# Patient Record
Sex: Male | Born: 1935 | Race: White | Hispanic: No | Marital: Married | Smoking: Former smoker
Health system: Southern US, Community
[De-identification: ages and names within clinical notes are randomized; demographics above are authoritative.]

## PROBLEM LIST (undated history)

## (undated) DIAGNOSIS — Z95810 Presence of automatic (implantable) cardiac defibrillator: Secondary | ICD-10-CM

## (undated) DIAGNOSIS — Z950 Presence of cardiac pacemaker: Secondary | ICD-10-CM

## (undated) DIAGNOSIS — I10 Essential (primary) hypertension: Secondary | ICD-10-CM

## (undated) DIAGNOSIS — E785 Hyperlipidemia, unspecified: Secondary | ICD-10-CM

## (undated) DIAGNOSIS — I428 Other cardiomyopathies: Secondary | ICD-10-CM

## (undated) DIAGNOSIS — R0602 Shortness of breath: Secondary | ICD-10-CM

## (undated) DIAGNOSIS — Z9889 Other specified postprocedural states: Secondary | ICD-10-CM

## (undated) DIAGNOSIS — I4891 Unspecified atrial fibrillation: Secondary | ICD-10-CM

## (undated) DIAGNOSIS — Q211 Atrial septal defect: Secondary | ICD-10-CM

## (undated) DIAGNOSIS — I5020 Unspecified systolic (congestive) heart failure: Secondary | ICD-10-CM

## (undated) DIAGNOSIS — I5022 Chronic systolic (congestive) heart failure: Secondary | ICD-10-CM

## (undated) DIAGNOSIS — Q2112 Patent foramen ovale: Secondary | ICD-10-CM

## (undated) DIAGNOSIS — I2510 Atherosclerotic heart disease of native coronary artery without angina pectoris: Secondary | ICD-10-CM

## (undated) DIAGNOSIS — K439 Ventral hernia without obstruction or gangrene: Secondary | ICD-10-CM

## (undated) HISTORY — PX: INTRAOPERATIVE TRANSESOPHAGEAL ECHOCARDIOGRAM: SHX5062

## (undated) HISTORY — DX: Presence of automatic (implantable) cardiac defibrillator: Z95.810

## (undated) HISTORY — PX: HIP PINNING: SHX1757

## (undated) HISTORY — PX: BILATERAL KNEE ARTHROSCOPY: SUR91

## (undated) HISTORY — DX: Essential (primary) hypertension: I10

## (undated) HISTORY — PX: OTHER SURGICAL HISTORY: SHX169

---

## 2002-04-21 ENCOUNTER — Encounter: Admission: RE | Admit: 2002-04-21 | Discharge: 2002-04-21 | Payer: Self-pay | Admitting: Family Medicine

## 2002-04-21 ENCOUNTER — Encounter: Payer: Self-pay | Admitting: Family Medicine

## 2002-06-09 ENCOUNTER — Encounter: Admission: RE | Admit: 2002-06-09 | Discharge: 2002-06-09 | Payer: Self-pay | Admitting: Orthopedic Surgery

## 2002-06-09 ENCOUNTER — Encounter: Payer: Self-pay | Admitting: Orthopedic Surgery

## 2002-06-11 ENCOUNTER — Ambulatory Visit (HOSPITAL_BASED_OUTPATIENT_CLINIC_OR_DEPARTMENT_OTHER): Admission: RE | Admit: 2002-06-11 | Discharge: 2002-06-12 | Payer: Self-pay | Admitting: Orthopedic Surgery

## 2002-06-13 ENCOUNTER — Emergency Department (HOSPITAL_COMMUNITY): Admission: EM | Admit: 2002-06-13 | Discharge: 2002-06-13 | Payer: Self-pay | Admitting: Emergency Medicine

## 2002-06-16 ENCOUNTER — Encounter: Payer: Self-pay | Admitting: Emergency Medicine

## 2002-06-16 ENCOUNTER — Emergency Department (HOSPITAL_COMMUNITY): Admission: EM | Admit: 2002-06-16 | Discharge: 2002-06-16 | Payer: Self-pay | Admitting: Emergency Medicine

## 2003-08-30 ENCOUNTER — Ambulatory Visit (HOSPITAL_COMMUNITY): Admission: RE | Admit: 2003-08-30 | Discharge: 2003-08-30 | Payer: Self-pay | Admitting: Gastroenterology

## 2003-08-30 ENCOUNTER — Encounter (INDEPENDENT_AMBULATORY_CARE_PROVIDER_SITE_OTHER): Payer: Self-pay | Admitting: Specialist

## 2004-07-30 ENCOUNTER — Encounter: Admission: RE | Admit: 2004-07-30 | Discharge: 2004-07-30 | Payer: Self-pay | Admitting: Family Medicine

## 2004-11-12 ENCOUNTER — Inpatient Hospital Stay (HOSPITAL_COMMUNITY): Admission: EM | Admit: 2004-11-12 | Discharge: 2004-11-16 | Payer: Self-pay | Admitting: Emergency Medicine

## 2004-11-13 ENCOUNTER — Ambulatory Visit: Payer: Self-pay | Admitting: Cardiology

## 2004-11-13 ENCOUNTER — Encounter: Payer: Self-pay | Admitting: Cardiology

## 2004-11-15 ENCOUNTER — Encounter: Payer: Self-pay | Admitting: Internal Medicine

## 2004-11-28 ENCOUNTER — Inpatient Hospital Stay (HOSPITAL_COMMUNITY)
Admission: RE | Admit: 2004-11-28 | Discharge: 2004-12-08 | Payer: Self-pay | Admitting: Thoracic Surgery (Cardiothoracic Vascular Surgery)

## 2004-12-11 ENCOUNTER — Ambulatory Visit: Payer: Self-pay | Admitting: Cardiology

## 2004-12-12 ENCOUNTER — Encounter
Admission: RE | Admit: 2004-12-12 | Discharge: 2005-01-28 | Payer: Self-pay | Admitting: Thoracic Surgery (Cardiothoracic Vascular Surgery)

## 2004-12-18 ENCOUNTER — Ambulatory Visit: Payer: Self-pay | Admitting: Cardiology

## 2004-12-19 ENCOUNTER — Ambulatory Visit: Payer: Self-pay

## 2004-12-25 ENCOUNTER — Ambulatory Visit: Payer: Self-pay | Admitting: Cardiology

## 2005-01-01 ENCOUNTER — Ambulatory Visit: Payer: Self-pay | Admitting: Cardiovascular Disease

## 2005-01-03 ENCOUNTER — Encounter (HOSPITAL_COMMUNITY): Admission: RE | Admit: 2005-01-03 | Discharge: 2005-04-03 | Payer: Self-pay | Admitting: *Deleted

## 2005-01-10 ENCOUNTER — Ambulatory Visit: Payer: Self-pay | Admitting: Internal Medicine

## 2005-01-17 ENCOUNTER — Ambulatory Visit: Payer: Self-pay | Admitting: Internal Medicine

## 2005-01-24 ENCOUNTER — Ambulatory Visit: Payer: Self-pay

## 2005-01-29 ENCOUNTER — Encounter
Admission: RE | Admit: 2005-01-29 | Discharge: 2005-04-29 | Payer: Self-pay | Admitting: Thoracic Surgery (Cardiothoracic Vascular Surgery)

## 2005-02-04 ENCOUNTER — Ambulatory Visit: Payer: Self-pay | Admitting: Cardiology

## 2005-02-19 ENCOUNTER — Ambulatory Visit: Payer: Self-pay | Admitting: Cardiology

## 2005-03-01 ENCOUNTER — Ambulatory Visit: Payer: Self-pay | Admitting: Cardiology

## 2005-03-11 ENCOUNTER — Ambulatory Visit: Payer: Self-pay | Admitting: Internal Medicine

## 2005-03-17 ENCOUNTER — Emergency Department (HOSPITAL_COMMUNITY): Admission: EM | Admit: 2005-03-17 | Discharge: 2005-03-18 | Payer: Self-pay | Admitting: Emergency Medicine

## 2005-03-20 ENCOUNTER — Ambulatory Visit: Payer: Self-pay | Admitting: Cardiology

## 2005-04-01 ENCOUNTER — Ambulatory Visit: Payer: Self-pay | Admitting: Cardiology

## 2005-04-01 ENCOUNTER — Ambulatory Visit: Payer: Self-pay

## 2005-04-05 ENCOUNTER — Encounter (HOSPITAL_COMMUNITY): Admission: RE | Admit: 2005-04-05 | Discharge: 2005-04-09 | Payer: Self-pay | Admitting: *Deleted

## 2005-04-10 ENCOUNTER — Ambulatory Visit: Payer: Self-pay | Admitting: *Deleted

## 2005-04-15 ENCOUNTER — Encounter (HOSPITAL_COMMUNITY): Admission: RE | Admit: 2005-04-15 | Discharge: 2005-07-14 | Payer: Self-pay | Admitting: Cardiology

## 2005-04-17 ENCOUNTER — Ambulatory Visit: Payer: Self-pay | Admitting: Cardiology

## 2005-04-23 ENCOUNTER — Ambulatory Visit: Payer: Self-pay | Admitting: Internal Medicine

## 2005-06-08 ENCOUNTER — Emergency Department (HOSPITAL_COMMUNITY): Admission: EM | Admit: 2005-06-08 | Discharge: 2005-06-08 | Payer: Self-pay | Admitting: Emergency Medicine

## 2005-07-29 ENCOUNTER — Encounter: Admission: RE | Admit: 2005-07-29 | Discharge: 2005-07-29 | Payer: Self-pay | Admitting: Family Medicine

## 2005-08-06 ENCOUNTER — Encounter (HOSPITAL_COMMUNITY): Admission: RE | Admit: 2005-08-06 | Discharge: 2005-11-04 | Payer: Self-pay | Admitting: *Deleted

## 2005-08-20 ENCOUNTER — Ambulatory Visit: Payer: Self-pay | Admitting: Internal Medicine

## 2005-11-05 ENCOUNTER — Encounter (HOSPITAL_COMMUNITY): Admission: RE | Admit: 2005-11-05 | Discharge: 2006-02-03 | Payer: Self-pay | Admitting: *Deleted

## 2005-11-19 ENCOUNTER — Ambulatory Visit: Payer: Self-pay | Admitting: Internal Medicine

## 2006-02-04 ENCOUNTER — Encounter (HOSPITAL_COMMUNITY): Admission: RE | Admit: 2006-02-04 | Discharge: 2006-05-05 | Payer: Self-pay | Admitting: *Deleted

## 2006-05-06 ENCOUNTER — Encounter (HOSPITAL_COMMUNITY): Admission: RE | Admit: 2006-05-06 | Discharge: 2006-08-04 | Payer: Self-pay | Admitting: *Deleted

## 2006-05-21 ENCOUNTER — Ambulatory Visit: Payer: Self-pay | Admitting: Internal Medicine

## 2006-08-05 ENCOUNTER — Encounter (HOSPITAL_COMMUNITY): Admission: RE | Admit: 2006-08-05 | Discharge: 2006-11-03 | Payer: Self-pay | Admitting: Cardiology

## 2006-11-04 ENCOUNTER — Encounter (HOSPITAL_COMMUNITY): Admission: RE | Admit: 2006-11-04 | Discharge: 2007-02-02 | Payer: Self-pay | Admitting: Cardiology

## 2006-12-09 ENCOUNTER — Ambulatory Visit: Payer: Self-pay | Admitting: Internal Medicine

## 2007-02-04 ENCOUNTER — Encounter (HOSPITAL_COMMUNITY): Admission: RE | Admit: 2007-02-04 | Discharge: 2007-05-05 | Payer: Self-pay | Admitting: Cardiology

## 2007-03-03 ENCOUNTER — Ambulatory Visit: Payer: Self-pay | Admitting: Internal Medicine

## 2007-05-07 ENCOUNTER — Encounter (HOSPITAL_COMMUNITY): Admission: RE | Admit: 2007-05-07 | Discharge: 2007-08-05 | Payer: Self-pay | Admitting: Cardiology

## 2007-06-02 ENCOUNTER — Ambulatory Visit: Payer: Self-pay | Admitting: Internal Medicine

## 2007-08-06 ENCOUNTER — Encounter (HOSPITAL_COMMUNITY): Admission: RE | Admit: 2007-08-06 | Discharge: 2007-11-04 | Payer: Self-pay | Admitting: Cardiology

## 2007-09-01 ENCOUNTER — Ambulatory Visit: Payer: Self-pay | Admitting: Internal Medicine

## 2007-11-05 ENCOUNTER — Encounter (HOSPITAL_COMMUNITY): Admission: RE | Admit: 2007-11-05 | Discharge: 2008-02-03 | Payer: Self-pay | Admitting: Cardiology

## 2008-02-04 ENCOUNTER — Encounter (HOSPITAL_COMMUNITY): Admission: RE | Admit: 2008-02-04 | Discharge: 2008-05-04 | Payer: Self-pay | Admitting: Cardiology

## 2008-02-23 ENCOUNTER — Ambulatory Visit: Payer: Self-pay | Admitting: Internal Medicine

## 2008-02-25 ENCOUNTER — Emergency Department (HOSPITAL_COMMUNITY): Admission: EM | Admit: 2008-02-25 | Discharge: 2008-02-25 | Payer: Self-pay | Admitting: Emergency Medicine

## 2008-05-06 ENCOUNTER — Encounter (HOSPITAL_COMMUNITY): Admission: RE | Admit: 2008-05-06 | Discharge: 2008-08-04 | Payer: Self-pay | Admitting: Cardiology

## 2008-05-23 ENCOUNTER — Ambulatory Visit: Payer: Self-pay

## 2008-05-23 ENCOUNTER — Encounter: Payer: Self-pay | Admitting: Internal Medicine

## 2008-08-05 ENCOUNTER — Encounter (HOSPITAL_COMMUNITY): Admission: RE | Admit: 2008-08-05 | Discharge: 2008-11-03 | Payer: Self-pay | Admitting: Cardiology

## 2008-08-18 ENCOUNTER — Ambulatory Visit: Payer: Self-pay | Admitting: Internal Medicine

## 2008-09-13 ENCOUNTER — Encounter: Payer: Self-pay | Admitting: Internal Medicine

## 2008-10-04 ENCOUNTER — Telehealth: Payer: Self-pay | Admitting: Internal Medicine

## 2008-11-04 ENCOUNTER — Ambulatory Visit: Payer: Self-pay | Admitting: Internal Medicine

## 2008-11-04 ENCOUNTER — Encounter (HOSPITAL_COMMUNITY): Admission: RE | Admit: 2008-11-04 | Discharge: 2009-02-02 | Payer: Self-pay | Admitting: Cardiology

## 2008-11-04 DIAGNOSIS — Z95810 Presence of automatic (implantable) cardiac defibrillator: Secondary | ICD-10-CM

## 2008-11-04 DIAGNOSIS — I10 Essential (primary) hypertension: Secondary | ICD-10-CM

## 2009-02-03 ENCOUNTER — Encounter (HOSPITAL_COMMUNITY): Admission: RE | Admit: 2009-02-03 | Discharge: 2009-05-04 | Payer: Self-pay | Admitting: Cardiology

## 2009-02-06 ENCOUNTER — Ambulatory Visit: Payer: Self-pay | Admitting: Internal Medicine

## 2009-02-17 ENCOUNTER — Encounter: Payer: Self-pay | Admitting: Internal Medicine

## 2009-04-21 ENCOUNTER — Telehealth: Payer: Self-pay | Admitting: Internal Medicine

## 2009-04-24 ENCOUNTER — Encounter: Payer: Self-pay | Admitting: Internal Medicine

## 2009-05-06 ENCOUNTER — Encounter (HOSPITAL_COMMUNITY): Admission: RE | Admit: 2009-05-06 | Discharge: 2009-08-04 | Payer: Self-pay | Admitting: Interventional Cardiology

## 2009-05-09 ENCOUNTER — Telehealth: Payer: Self-pay | Admitting: Internal Medicine

## 2009-05-11 ENCOUNTER — Ambulatory Visit: Payer: Self-pay | Admitting: Internal Medicine

## 2009-05-19 ENCOUNTER — Telehealth (INDEPENDENT_AMBULATORY_CARE_PROVIDER_SITE_OTHER): Payer: Self-pay | Admitting: *Deleted

## 2009-05-29 ENCOUNTER — Ambulatory Visit: Payer: Self-pay | Admitting: Internal Medicine

## 2009-06-08 ENCOUNTER — Encounter: Payer: Self-pay | Admitting: Internal Medicine

## 2009-06-27 ENCOUNTER — Encounter: Payer: Self-pay | Admitting: Internal Medicine

## 2009-07-20 ENCOUNTER — Ambulatory Visit: Payer: Self-pay | Admitting: Internal Medicine

## 2009-08-05 ENCOUNTER — Encounter (HOSPITAL_COMMUNITY): Admission: RE | Admit: 2009-08-05 | Discharge: 2009-11-03 | Payer: Self-pay | Admitting: Cardiology

## 2009-08-10 ENCOUNTER — Telehealth: Payer: Self-pay | Admitting: Internal Medicine

## 2009-09-21 ENCOUNTER — Ambulatory Visit: Payer: Self-pay | Admitting: Internal Medicine

## 2009-09-21 DIAGNOSIS — I4891 Unspecified atrial fibrillation: Secondary | ICD-10-CM

## 2009-10-19 ENCOUNTER — Ambulatory Visit: Payer: Self-pay | Admitting: Internal Medicine

## 2009-10-20 ENCOUNTER — Telehealth: Payer: Self-pay | Admitting: Internal Medicine

## 2009-11-02 ENCOUNTER — Encounter: Payer: Self-pay | Admitting: Internal Medicine

## 2009-11-04 ENCOUNTER — Encounter (HOSPITAL_COMMUNITY): Admission: RE | Admit: 2009-11-04 | Discharge: 2010-02-02 | Payer: Self-pay | Admitting: Cardiology

## 2009-12-12 ENCOUNTER — Telehealth: Payer: Self-pay | Admitting: Internal Medicine

## 2009-12-25 ENCOUNTER — Encounter: Payer: Self-pay | Admitting: Internal Medicine

## 2010-01-18 ENCOUNTER — Ambulatory Visit: Payer: Self-pay | Admitting: Internal Medicine

## 2010-01-19 ENCOUNTER — Encounter: Payer: Self-pay | Admitting: Internal Medicine

## 2010-01-22 ENCOUNTER — Ambulatory Visit: Payer: Self-pay | Admitting: Internal Medicine

## 2010-01-22 LAB — CONVERTED CEMR LAB
BUN: 21 mg/dL (ref 6–23)
Basophils Absolute: 0 10*3/uL (ref 0.0–0.1)
CO2: 32 meq/L (ref 19–32)
Calcium: 9.1 mg/dL (ref 8.4–10.5)
Creatinine, Ser: 1.2 mg/dL (ref 0.4–1.5)
Eosinophils Absolute: 0.3 10*3/uL (ref 0.0–0.7)
Eosinophils Relative: 4.3 % (ref 0.0–5.0)
GFR calc non Af Amer: 60.53 mL/min (ref >60)
Lymphocytes Relative: 18.8 % (ref 12.0–46.0)
MCV: 92.9 fL (ref 78.0–100.0)
Monocytes Relative: 7.6 % (ref 3.0–12.0)
Neutro Abs: 4 10*3/uL (ref 1.4–7.7)
Platelets: 125 10*3/uL — ABNORMAL LOW (ref 150.0–400.0)
Prothrombin Time: 13.3 s — ABNORMAL HIGH (ref 9.7–11.8)
RBC: 4.34 M/uL (ref 4.22–5.81)
RDW: 15 % — ABNORMAL HIGH (ref 11.5–14.6)
Sodium: 142 meq/L (ref 135–145)

## 2010-01-29 ENCOUNTER — Ambulatory Visit (HOSPITAL_COMMUNITY): Admission: RE | Admit: 2010-01-29 | Discharge: 2010-01-29 | Payer: Self-pay | Admitting: Internal Medicine

## 2010-01-29 ENCOUNTER — Ambulatory Visit: Payer: Self-pay | Admitting: Internal Medicine

## 2010-01-30 ENCOUNTER — Telehealth: Payer: Self-pay | Admitting: Internal Medicine

## 2010-02-03 ENCOUNTER — Encounter (HOSPITAL_COMMUNITY)
Admission: RE | Admit: 2010-02-03 | Discharge: 2010-05-04 | Payer: Self-pay | Source: Home / Self Care | Attending: Internal Medicine | Admitting: Internal Medicine

## 2010-02-05 ENCOUNTER — Encounter: Payer: Self-pay | Admitting: Internal Medicine

## 2010-02-19 ENCOUNTER — Ambulatory Visit: Payer: Self-pay

## 2010-02-19 ENCOUNTER — Encounter: Payer: Self-pay | Admitting: Internal Medicine

## 2010-03-20 ENCOUNTER — Encounter: Payer: Self-pay | Admitting: Internal Medicine

## 2010-05-08 ENCOUNTER — Encounter (HOSPITAL_COMMUNITY)
Admission: RE | Admit: 2010-05-08 | Discharge: 2010-06-05 | Payer: Self-pay | Source: Home / Self Care | Attending: Internal Medicine | Admitting: Internal Medicine

## 2010-05-09 ENCOUNTER — Encounter: Payer: Self-pay | Admitting: Internal Medicine

## 2010-05-09 ENCOUNTER — Ambulatory Visit
Admission: RE | Admit: 2010-05-09 | Discharge: 2010-05-09 | Payer: Self-pay | Source: Home / Self Care | Attending: Internal Medicine | Admitting: Internal Medicine

## 2010-06-05 NOTE — Letter (Signed)
Summary: Implantable Device Instructions  [employer] [employer], Main Office  [address]   [place], [place] [address]   Phone: [phone]  Fax: [phone]      Implantable Device Instructions  You are scheduled for:   _____ Generator Change  on 01/29/10 with Dr. Ladona Ridgel.  1.  Please arrive at the Short Stay Center at [facility] at 7:30am on the day of your procedure.  2.  Do not eat or drink the night before your procedure.  3.  Complete lab work on 01/19/10 at 10:00am.  .  You do not have to be fasting.  4.  Do NOT take these medications for the morning of your procedure:  Furosemide.  5.  Plan for an overnight stay.  Bring your insurance cards and a list of your medications.  6.  Wash your chest and neck with antibacterial soap (any brand) the evening before and the morning of your procedure.  Rinse well.   *If you have ANY questions after you get home, please call the office [phone]. Arthur Armstrong  *Every attempt is made to prevent procedures from being rescheduled.  Due to the nauture of Electrophysiology, rescheduling can happen.  The physician is always aware and directs the staff when this occurs.

## 2010-06-05 NOTE — Progress Notes (Signed)
Summary: still tired  Phone Note Call from Patient Call back at [phone]   Caller: Daughter/Sheila Mayford Knife Reason for Call: Talk to Nurse Summary of Call: pt is still feeling very tired... saw PCP and was told to contact us Initial call taken by: Migdalia Dk,  May 19, 2009 2:51 PM  Follow-up for Phone Call        The patient was scheduled to see GT 05/29/2009 and the daughter is in agreement.   Follow-up by: Altha Harm, LPN,  May 19, 2009 3:41 PM

## 2010-06-05 NOTE — Progress Notes (Signed)
Summary: pt has questions about his refills  Phone Note Call from Patient Call back at [phone]   Caller: Patient Reason for Call: Talk to Nurse, Talk to Doctor Summary of Call: pt went and picked up furosemide and he only has 4 refills and he gets only a 30day supply so it should have been 11 refills and he dosen't understand Initial call taken by: Omer Jack,  August 10, 2009 10:36 AM  Follow-up for Phone Call        llmovm giving answer to question. Follow-up by: Laurance Flatten CMA,  August 14, 2009 9:12 AM

## 2010-06-05 NOTE — Miscellaneous (Signed)
Summary: [facility] Progress Note  [facility] Progress Note   Imported By: Roderic Ovens 07/18/2009 12:25:57  _____________________________________________________________________  External Attachment:    Type:   Image     Comment:   External Document

## 2010-06-05 NOTE — Cardiovascular Report (Signed)
Summary: Office Visit Remote   Office Visit Remote   Imported By: Roderic Ovens 01/23/2010 13:48:22  _____________________________________________________________________  External Attachment:    Type:   Image     Comment:   External Document

## 2010-06-05 NOTE — Progress Notes (Signed)
Summary: question re med sent to [facility]  Phone Note Call from Patient   Caller: Daughter Donnel Saxon [phone] Reason for Call: Talk to Nurse Summary of Call: pt's med were all sent to [facility] at his last visit and dtr wanted o know why Initial call taken by: Glynda Jaeger,  January 30, 2010 4:05 PM  Follow-up for Phone Call        spoke with daughter and tried to explain that the only way we would have know to call into [facility] would have been he would have told us.  we will call all future Rx's into [facility] per his daughter. Dennis Bast, RN, BSN  January 30, 2010 4:20 PM

## 2010-06-05 NOTE — Letter (Signed)
Summary: Remote Device Check  [employer], Main Office  [address]   [place], [place] [address]   Phone: [phone]  Fax: [phone]     November 02, 2009 MRN: [id number]   [facility] [address] [place], [place]  [address]   Dear Mr. Lamadrid,   Your remote transmission was recieved and reviewed by your physician.  All diagnostics were within normal limits for you.  __X___Your next transmission is scheduled for:  01-18-2010.  Please transmit at any time this day.  If you have a wireless device your transmission will be sent automatically.    Sincerely,  Vella Kohler

## 2010-06-05 NOTE — Miscellaneous (Signed)
Summary: [facility] Progress Note  [facility] Progress Note   Imported By: Roderic Ovens 07/20/2009 11:26:28  _____________________________________________________________________  External Attachment:    Type:   Image     Comment:   External Document

## 2010-06-05 NOTE — Procedures (Signed)
Summary: wound check.[email or web address].amber   Current Medications (verified): 1)  Potassium Chloride Cr 10 Meq Cr-Caps (Potassium Chloride) .... Take One Tablet By Mouth Daily 2)  Coreg 6.25 Mg Tabs (Carvedilol) .Marland Kitchen.. 1 By Mouth Two Times A Day 3)  Lasix 40 Mg Tabs (Furosemide) .... Take 1 Tablet By Mouth Every Morning and 1 Tablet By Mouth At Lunch 4)  Aspirin Ec 325 Mg Tbec (Aspirin) .... Take One Tablet By Mouth Daily 5)  Niaspan 500 Mg Cr-Tabs (Niacin (Antihyperlipidemic)) .Marland Kitchen.. 1 By Mouth Once Daily 6)  Flomax 0.4 Mg Xr24h-Cap (Tamsulosin Hcl) .Marland Kitchen.. 1 By Mouth At Bedtime 7)  Ramipril 2.5 Mg Caps (Ramipril) .... Take 1 Capsule By Mouth Two Times A Day  Allergies (verified): No Known Drug Allergies   ICD Specifications Following MD:  Lewayne Bunting, MD     ICD Vendor:  St Jude     ICD Model Number:  [phone]     ICD Serial Number:  [id number] ICD DOI:  01/29/2010     ICD Implanting MD:  Lewayne Bunting, MD  Lead 1:    Location: RA     DOI: 12/05/2004     Model #: 1488TC     Serial #: [id number]     Status: active Lead 2:    Location: RV     DOI: 12/05/2004     Model #: 7001     Serial #: [id number]     Status: active Lead 3:    Location: LV     DOI: 12/05/2004     Model #: 1056     Serial #: [id number]     Status: active  Indications::  NICM, CHF  Explantation Comments: 01/29/10 St. Jude Atlas V343/300792 explanted  ICD Follow Up Battery Voltage:  >95% V     Charge Time:  8.5 seconds     Battery Est. Longevity:  6.2 yrs Underlying rhythm:  SR ICD Dependent:  No       ICD Device Measurements Atrium:  Amplitude: 1.8 mV, Impedance: 390 ohms,  Right Ventricle:  Amplitude: 12.0 mV, Impedance: 430 ohms, Threshold: 0.5 V at 0.5 msec Left Ventricle:  Impedance: 360 ohms, Threshold: 1.25 V at 0.8 msec Configuration: BIPOLAR  Episodes MS Episodes:  3     Percent Mode Switch:  >99%     Coumadin:  No Shock:  0     ATP:  0     Nonsustained:  0     Atrial Therapies:  0 Atrial Pacing:  0%     Ventricular Pacing:   74%  Brady Parameters Mode DDD     Lower Rate Limit:  60     Upper Rate Limit 110 PAV 170     Sensed AV Delay:  150  Tachy Zones VF:  240     VT:  200     VT1:  160     Next Cardiology Appt Due:  05/09/2010 Tech Comments:  WOUND CHECK--STERI STRIPS REMOVED.  NO REDNESS OR SWELLING AT SITE.  NORMAL DEVICE FUNCTION.  PT IN AF 99% OF TIME. + ASPIRIN.  NO COUMADIN THERAPY. QUICK OPT PERFORMED--INTERVENTRICULAR PACE DELAY CHANGED FROM 25 TO 30ms.  CHANGED LV OUTPUT FROM 2.25 TO 2.5 V.  ROV IN JAN W/GT. Vella Kohler  February 20, 2010 8:25 AM

## 2010-06-05 NOTE — Progress Notes (Signed)
Summary: pt having sob  Phone Note Call from Patient Call back at [phone]   Caller: Daughter/ Velna Hatchet Summary of Call: Pt having SOB Initial call taken by: Judie Grieve,  December 12, 2009 4:52 PM  Follow-up for Phone Call        He over slept this morning and he was SOB  Fluid is up.  Dr Wylene Simmer saw pt today and Increased his fliuid pill and told him to come back on Mon.  Spoke with daughter.  They will do this and she will let me know if things do not get better. Dennis Bast, RN, BSN  December 12, 2009 6:12 PM

## 2010-06-05 NOTE — Assessment & Plan Note (Signed)
Summary: 6 wk f/u   Primary Provider:  Simone Curia, MD   History of Present Illness: Arthur Armstrong returns today for followup.  He is a pleasant 77 yo man with a h/o DCM, CHF, and is s/p BIV ICD.  The patient notes that his dyspnea remains prevalent. He has class 2 symptoms.  He denies c/p, peripheral edema or syncope.  He has not had any intercurrent ICD therapies. When I saw him last a month ago, he was having more CHF symptoms and he had his dose of lasix increased though there was some compliance issues.  He has done a nice job of trying to avoid sodium. He was noted in rehab to have a drop in his pulse ox to 84% with exertion.  Current Medications (verified): 1)  Potassium Chloride Cr 10 Meq Cr-Caps (Potassium Chloride) .... Take One Tablet By Mouth Daily 2)  Coreg 6.25 Mg Tabs (Carvedilol) .Marland Kitchen.. 1 By Mouth Two Times A Day 3)  Lasix 40 Mg Tabs (Furosemide) .... Take 1 Tablet By Mouth Once A Day As Directed 4)  Aspirin Ec 325 Mg Tbec (Aspirin) .... Take One Tablet By Mouth Daily 5)  Niaspan 500 Mg Cr-Tabs (Niacin (Antihyperlipidemic)) .Marland Kitchen.. 1 By Mouth Once Daily 6)  Ramipril 1.25 Mg Caps (Ramipril) .... Take One Capsule By Mouth Two Times A Day 7)  Flomax 0.4 Mg Xr24h-Cap (Tamsulosin Hcl) .Marland Kitchen.. 1 By Mouth At Bedtime  Allergies (verified): No Known Drug Allergies  Past History:  Past Medical History: Last updated: 11/04/2008 AUTOMATIC IMPLANTABLE CARDIAC DEFIBRILLATOR SITU (ICD-V45.02) CARDIOMYOPATHY, SECONDARY (ICD-425.9) CHF (ICD-428.0) HYPERTENSION (ICD-401.9)  Past Surgical History: Last updated: 11/04/2008  1.  Right shoulder surgery.  2.  Bilateral knee arthroscopy  Colonoscopy with polypectomy.   Intraoperative echocardiogram.  Median sternotomy for mitral valve repair Implantation of a dual chamber biventricular ICD.  12/05/2004  Arthur Armstrong. Ladona Ridgel, M.D.   Review of Systems       The patient complains of dyspnea on exertion.  The patient denies chest pain, syncope, and  peripheral edema.    Vital Signs:  Patient profile:   77 year old male Height:      66.5 inches Weight:      186 pounds BMI:     29.68 Pulse rate:   60 / minute BP sitting:   126 / 84  (left arm) Cuff size:   regular  Vitals Entered By: Arthur Bast, RN, BSN (July 20, 2009 2:30 PM)  Physical Exam  General:  Well developed, well nourished, in no acute distress. Head:  normocephalic and atraumatic Eyes:  PERRLA/EOM intact; conjunctiva and lids normal. Mouth:  Teeth, gums and palate normal. Oral mucosa normal. Neck:  Neck supple, no JVD. No masses, thyromegaly or abnormal cervical nodes. Chest Wall:  Well healed ICD incision. Lungs:  Clear bilaterally.  No wheezes, rales, or rhonchi. Heart:  RRR with normal S1 and S2.  PMI is enlarged and laterally displaced.  No murmurs. Abdomen:  Bowel sounds positive; abdomen soft and non-tender without masses, organomegaly, or hernias noted. No hepatosplenomegaly. Msk:  Back normal, normal gait. Muscle strength and tone normal. Pulses:  pulses normal in all 4 extremities Extremities:  No clubbing or cyanosis.  Mild ecchymosis is present on the arms. Neurologic:  Alert and oriented x 3.    ICD Specifications Following MD:  Arthur Bunting, MD     ICD Vendor:  [facility] Jude     ICD Model Number:  [phone]     ICD Serial Number:  [id number] ICD DOI:  12/05/2004     ICD Implanting MD:  Arthur Bunting, MD  Lead 1:    Location: RA     DOI: 12/05/2004     Model #: 1488TC     Serial #: [id number]     Status: active Lead 2:    Location: RV     DOI: 12/05/2004     Model #: 7001     Serial #: [id number]     Status: active Lead 3:    Location: LV     DOI: 12/05/2004     Model #: 1056     Serial #: [id number]     Status: active  Indications::  NICM, CHF   ICD Follow Up Remote Check?  No Battery Voltage:  2.54 V     Charge Time:  12.6 seconds     Underlying rhythm:  a-fib/dependent ICD Dependent:  Yes       ICD Device Measurements Atrium:  Amplitude: 0.8 mV, Impedance:  410 ohms,  Right Ventricle:  Impedance: 520 ohms, Threshold: 0.5 V at 0.5 msec Left Ventricle:  Impedance: 770 ohms, Threshold: 2.0 V at 1.0 msec Configuration: BIPOLAR  Episodes Coumadin:  No Atrial Pacing:  <1%     Ventricular Pacing:  94%  Brady Parameters Mode DDIR     Lower Rate Limit:  60     Upper Rate Limit 120 PAV 170     Sensed AV Delay:  120  Tachy Zones VF:  240     VT:  200     VT1:  160     Next Remote Date:  08/21/2009     Next Cardiology Appt Due:  09/03/2009 Tech Comments:  No parameter changes.  A-fib/dependent today, - coumadin.  Arthur Armstrong has an Atlas device with the battery voltage 2.54 today and is dependent.  Because of the unpredictability of the battery after it reaches 2.55 we will check him in one month via Merlin and have him return in 2months. Arthur Harm, LPN  July 20, 2009 2:44 PM  MD Comments:  Agree with above.  Impression & Recommendations:  Problem # 1:  AUTOMATIC IMPLANTABLE CARDIAC DEFIBRILLATOR SITU (ICD-V45.02) His device is working normally.  Will folllowup in several months.  Problem # 2:  CHF (ICD-428.0) His symptoms are class 2 but I think he will feel better if we uptitrate his lasix and I have asked him to take 80 mg daily. Today, he walked with me in the hall on pulse oximetry and his oxygen sats stayed in the 90-92 range. His updated medication list for this problem includes:    Coreg 6.25 Mg Tabs (Carvedilol) .Marland Kitchen... 1 by mouth two times a day    Lasix 40 Mg Tabs (Furosemide) .Marland Kitchen... Take 1 tablet by mouth once a day as directed    Aspirin Ec 325 Mg Tbec (Aspirin) .Marland Kitchen... Take one tablet by mouth daily    Ramipril 1.25 Mg Caps (Ramipril) .Marland Kitchen... Take one capsule by mouth two times a day  Orders: EKG w/ Interpretation (93000)  Problem # 3:  HYPERTENSION (ICD-401.9) His blood pressure is well controlled today.  He will continue his current medical therapy. His updated medication list for this problem includes:    Coreg 6.25 Mg Tabs  (Carvedilol) .Marland Kitchen... 1 by mouth two times a day    Lasix 40 Mg Tabs (Furosemide) .Marland Kitchen... Take 1 tablet by mouth once a day as directed    Aspirin Ec 325 Mg Tbec (Aspirin) .Marland Kitchen... Take  one tablet by mouth daily    Ramipril 1.25 Mg Caps (Ramipril) .Marland Kitchen... Take one capsule by mouth two times a day  Patient Instructions: 1)  Your physician recommends that you schedule a follow-up appointment in: 2 months with Dr Ladona Ridgel

## 2010-06-05 NOTE — Cardiovascular Report (Signed)
Summary: Pre Op Orders   Pre Op Orders   Imported By: Roderic Ovens 02/02/2010 09:17:39  _____________________________________________________________________  External Attachment:    Type:   Image     Comment:   External Document

## 2010-06-05 NOTE — Miscellaneous (Signed)
Summary: Device change out  Clinical Lists Changes  Observations: Added new observation of ICD IMPL DTE: 01/29/2010 (02/05/2010 13:32) Added new observation of ICD SERL#: [id number]  (02/05/2010 13:32) Added new observation of ICD MODL#: [id number]  (02/05/2010 21:30) Added new observation of ICDEXPLCOMM: 01/29/10 St. Jude Atlas V343/300792 explanted  (02/05/2010 13:32)       ICD Specifications Following MD:  Lewayne Bunting, MD     ICD Vendor:  St Jude     ICD Model Number:  [phone]     ICD Serial Number:  [id number] ICD DOI:  01/29/2010     ICD Implanting MD:  Lewayne Bunting, MD  Lead 1:    Location: RA     DOI: 12/05/2004     Model #: 1488TC     Serial #: [id number]     Status: active Lead 2:    Location: RV     DOI: 12/05/2004     Model #: 7001     Serial #: [id number]     Status: active Lead 3:    Location: LV     DOI: 12/05/2004     Model #: 1056     Serial #: [id number]     Status: active  Indications::  NICM, CHF  Explantation Comments: 01/29/10 St. Jude Atlas V343/300792 explanted  ICD Follow Up ICD Dependent:  No       ICD Device Measurements Configuration: BIPOLAR  Episodes Coumadin:  No  Brady Parameters Mode DDIR     Lower Rate Limit:  60     Upper Rate Limit 110 PAV 170     Sensed AV Delay:  120  Tachy Zones VF:  240     VT:  200     VT1:  160

## 2010-06-05 NOTE — Letter (Signed)
Summary: Exercise Flow Sheet  Exercise Flow Sheet   Imported By: Marylou Mccoy 02/09/2010 15:13:00  _____________________________________________________________________  External Attachment:    Type:   Image     Comment:   External Document

## 2010-06-05 NOTE — Cardiovascular Report (Signed)
Summary: Office Visit   Office Visit   Imported By: Roderic Ovens 06/01/2009 14:07:18  _____________________________________________________________________  External Attachment:    Type:   Image     Comment:   External Document

## 2010-06-05 NOTE — Assessment & Plan Note (Signed)
Summary: rov/ gd   Visit Type:  Follow-up Primary Provider:  Simone Curia, MD   History of Present Illness: [name] returns today for followup.  He is a pleasant 77 yo man with a h/o DCM, CHF, and is s/p BIV ICD.  The patient notes that his dyspnea suddenly worsened several months ago and he struggled to work out as fast as normally does.  He denies c/p, peripheral edema or syncope.  He has not had any intercurrent ICD therapies.  Current Medications (verified): 1)  Potassium Chloride Cr 10 Meq Cr-Caps (Potassium Chloride) .... Take One Tablet By Mouth Daily 2)  Coreg 6.25 Mg Tabs (Carvedilol) .Marland Kitchen.. 1 By Mouth Two Times A Day 3)  Lasix 40 Mg Tabs (Furosemide) .Marland Kitchen.. 1 By Mouth Once Daily 4)  Aspirin Ec 325 Mg Tbec (Aspirin) .... Take One Tablet By Mouth Daily 5)  Niaspan 500 Mg Cr-Tabs (Niacin (Antihyperlipidemic)) .Marland Kitchen.. 1 By Mouth Once Daily 6)  Ramipril 1.25 Mg Caps (Ramipril) .... Take One Capsule By Mouth Two Times A Day 7)  Flomax 0.4 Mg Xr24h-Cap (Tamsulosin Hcl) .Marland Kitchen.. 1 By Mouth At Bedtime  Allergies (verified): No Known Drug Allergies  Past History:  Past Medical History: Last updated: 11/04/2008 AUTOMATIC IMPLANTABLE CARDIAC DEFIBRILLATOR SITU (ICD-V45.02) CARDIOMYOPATHY, SECONDARY (ICD-425.9) CHF (ICD-428.0) HYPERTENSION (ICD-401.9)  Past Surgical History: Last updated: 11/04/2008  1.  Right shoulder surgery.  2.  Bilateral knee arthroscopy  Colonoscopy with polypectomy.   Intraoperative echocardiogram.  Median sternotomy for mitral valve repair Implantation of a dual chamber biventricular ICD.  12/05/2004  Arthur Armstrong. Ladona Ridgel, M.D.   Review of Systems       The patient complains of dyspnea on exertion.  The patient denies chest pain, syncope, and peripheral edema.    Vital Signs:  Patient profile:   77 year old male Height:      66.5 inches Weight:      187 pounds BMI:     29.84 Pulse rate:   76 / minute BP sitting:   140 / 80  (left arm)  Vitals Entered  By: Laurance Flatten CMA (May 11, 2009 9:53 AM)  Physical Exam  General:  Well developed, well nourished, in no acute distress. Head:  normocephalic and atraumatic Eyes:  PERRLA/EOM intact; conjunctiva and lids normal. Mouth:  Teeth, gums and palate normal. Oral mucosa normal. Neck:  Neck supple, no JVD. No masses, thyromegaly or abnormal cervical nodes. Chest Wall:  Well healed ICD incision. Lungs:  Clear bilaterally.  No wheezes, rales, or rhonchi. Heart:  RRR with normal S1 and S2.  PMI is enlarged and laterally displaced.  No murmurs. Abdomen:  Bowel sounds positive; abdomen soft and non-tender without masses, organomegaly, or hernias noted. No hepatosplenomegaly. Msk:  Back normal, normal gait. Muscle strength and tone normal. Pulses:  pulses normal in all 4 extremities Extremities:  No clubbing or cyanosis.  Mild ecchymosis is present on the arms. Neurologic:  Alert and oriented x 3.    ICD Specifications Following MD:  Lewayne Bunting, MD     ICD Vendor:  [facility] Jude     ICD Model Number:  [phone]     ICD Serial Number:  [id number] ICD DOI:  12/05/2004     ICD Implanting MD:  Lewayne Bunting, MD  Lead 1:    Location: RA     DOI: 12/05/2004     Model #: 1488TC     Serial #: [id number]     Status: active Lead 2:  Location: RV     DOI: 12/05/2004     Model #: 7001     Serial #: [id number]     Status: active Lead 3:    Location: LV     DOI: 12/05/2004     Model #: 1056     Serial #: [id number]     Status: active  Indications::  NICM, CHF   ICD Follow Up ICD Dependent:  Yes      Episodes Coumadin:  No  Brady Parameters Mode DDDR     Lower Rate Limit:  60     Upper Rate Limit 120 PAV 170     Sensed AV Delay:  120  Tachy Zones VF:  240     VT:  200     VT1:  160     MD Comments:  His LV threshold has been increased to prevent non-capture.  Impression & Recommendations:  Problem # 1:  AUTOMATIC IMPLANTABLE CARDIAC DEFIBRILLATOR SITU (ICD-V45.02) His device has been reprogrammed to allow for LV  capture.  Will followup in several months.  Problem # 2:  CHF (ICD-428.0) His CHF symptoms are class 2.  A low sodium diet is recommended.  He will continue current meds. His updated medication list for this problem includes:    Coreg 6.25 Mg Tabs (Carvedilol) .Marland Kitchen... 1 by mouth two times a day    Lasix 40 Mg Tabs (Furosemide) .Marland Kitchen... 1 by mouth once daily    Aspirin Ec 325 Mg Tbec (Aspirin) .Marland Kitchen... Take one tablet by mouth daily    Ramipril 1.25 Mg Caps (Ramipril) .Marland Kitchen... Take one capsule by mouth two times a day  Problem # 3:  HYPERTENSION (ICD-401.9) A low sodium diet is recommended.  Continue current meds. His updated medication list for this problem includes:    Coreg 6.25 Mg Tabs (Carvedilol) .Marland Kitchen... 1 by mouth two times a day    Lasix 40 Mg Tabs (Furosemide) .Marland Kitchen... 1 by mouth once daily    Aspirin Ec 325 Mg Tbec (Aspirin) .Marland Kitchen... Take one tablet by mouth daily    Ramipril 1.25 Mg Caps (Ramipril) .Marland Kitchen... Take one capsule by mouth two times a day  Patient Instructions: 1)  Your physician recommends that you schedule a follow-up appointment in: 12 months with Dr Ladona Ridgel  Appended Document: [facility]     Allergies: No Known Drug Allergies   ICD Specifications Following MD:  Lewayne Bunting, MD     ICD Vendor:  [employer] Jude     ICD Model Number:  [phone]     ICD Serial Number:  [id number] ICD DOI:  12/05/2004     ICD Implanting MD:  Lewayne Bunting, MD  Lead 1:    Location: RA     DOI: 12/05/2004     Model #: 1488TC     Serial #: [id number]     Status: active Lead 2:    Location: RV     DOI: 12/05/2004     Model #: 7001     Serial #: [id number]     Status: active Lead 3:    Location: LV     DOI: 12/05/2004     Model #: 1056     Serial #: [id number]     Status: active  Indications::  NICM, CHF   ICD Follow Up Remote Check?  No Battery Voltage:  2.55 V     Charge Time:  12.4 seconds     Underlying rhythm:  AFIB WITH BRADYCARDIA ICD Dependent:  No       ICD Device Measurements Atrium:  Amplitude:  1.0 mV, Impedance: 400 ohms,  Right Ventricle:  Amplitude: 12 mV, Impedance: 500 ohms, Threshold: 0.5 V at 0.5 msec Left Ventricle:  Impedance: 700 ohms, Threshold: 2.75 V at 0.7 msec Configuration: BIPOLAR Shock Impedance: 34 ohms   Episodes MS Episodes:  1     Percent Mode Switch:  100%     Coumadin:  No Shock:  0     ATP:  0     Nonsustained:  0     Atrial Pacing:  0%     Ventricular Pacing:  100%  Brady Parameters Mode DDIR     Lower Rate Limit:  60     Upper Rate Limit 120 PAV 170     Sensed AV Delay:  120  Tachy Zones VF:  240     VT:  200     VT1:  160     Next Remote Date:  08/07/2009     Next Cardiology Appt Due:  05/07/2010 Tech Comments:  Normal device function.  LV threshold increased today, LV output increased to 3.5V at .  Pt advised to be aware of diaphragmatic stim.  Parameters also changed to DDIR to allow [place] to evaluate histagrams during afib.  This device does not give histagrams during mode switch.  No other changes made today.  Pt does Merlin transmissions.  ROV 12 months GT. Gypsy Balsam RN BSN  May 11, 2009 10:41 AM  MD Comments:  Agree with above.

## 2010-06-05 NOTE — Cardiovascular Report (Signed)
Summary: Office Visit Remote   Office Visit Remote   Imported By: Roderic Ovens 11/03/2009 16:39:17  _____________________________________________________________________  External Attachment:    Type:   Image     Comment:   External Document

## 2010-06-05 NOTE — Assessment & Plan Note (Signed)
Summary: PER CHECKO UT/SF   Visit Type:  Follow-up Primary Provider:  Simone Curia, MD   History of Present Illness: Mr. Spahr returns today for followup.  He is a pleasant 77 yo man with a h/o DCM, CHF, and is s/p BIV ICD.  He has atrial fibrillation but has been intolerant to a strategy of rhythm control and coumadin in the past.  The patient notes that his dyspnea remains with exertion. He has class 2 symptoms.  He denies c/p, peripheral edema or syncope.  He has not had any intercurrent ICD therapies. When I saw him last two months ago, he was having more CHF symptoms and he had his dose of lasix increased though there was some compliance issues.  He has done a nice job of trying to avoid sodium.  He has not had any intercurrent ICD therapies.  Current Medications (verified): 1)  Potassium Chloride Cr 10 Meq Cr-Caps (Potassium Chloride) .... Take One Tablet By Mouth Daily 2)  Coreg 6.25 Mg Tabs (Carvedilol) .Marland Kitchen.. 1 By Mouth Two Times A Day 3)  Lasix 40 Mg Tabs (Furosemide) .... Take 1 Tablet By Mouth Once A Day As Directed 4)  Aspirin Ec 325 Mg Tbec (Aspirin) .... Take One Tablet By Mouth Daily 5)  Niaspan 500 Mg Cr-Tabs (Niacin (Antihyperlipidemic)) .Marland Kitchen.. 1 By Mouth Once Daily 6)  Ramipril 1.25 Mg Caps (Ramipril) .... Take One Capsule By Mouth Two Times A Day 7)  Flomax 0.4 Mg Xr24h-Cap (Tamsulosin Hcl) .Marland Kitchen.. 1 By Mouth At Bedtime  Allergies (verified): No Known Drug Allergies  Past History:  Past Medical History: Last updated: 11/04/2008 AUTOMATIC IMPLANTABLE CARDIAC DEFIBRILLATOR SITU (ICD-V45.02) CARDIOMYOPATHY, SECONDARY (ICD-425.9) CHF (ICD-428.0) HYPERTENSION (ICD-401.9)  Past Surgical History: Last updated: 11/04/2008  1.  Right shoulder surgery.  2.  Bilateral knee arthroscopy  Colonoscopy with polypectomy.   Intraoperative echocardiogram.  Median sternotomy for mitral valve repair Implantation of a dual chamber biventricular ICD.  12/05/2004  Doylene Canning. Ladona Ridgel, M.D.    Review of Systems  The patient denies chest pain, syncope, dyspnea on exertion, and peripheral edema.    Vital Signs:  Patient profile:   77 year old male Height:      66.5 inches Weight:      184 pounds BMI:     29.36 Pulse rate:   59 / minute BP sitting:   138 / 80  (left arm)  Vitals Entered By: Laurance Flatten CMA (Sep 21, 2009 1:38 PM)  Physical Exam  General:  Well developed, well nourished, in no acute distress. Head:  normocephalic and atraumatic Eyes:  PERRLA/EOM intact; conjunctiva and lids normal. Mouth:  Teeth, gums and palate normal. Oral mucosa normal. Neck:  Neck supple, no JVD. No masses, thyromegaly or abnormal cervical nodes. Chest Wall:  Well healed ICD incision. Lungs:  Clear bilaterally.  No wheezes, rales, or rhonchi. Heart:  RRR with normal S1 and S2.  PMI is enlarged and laterally displaced.  No murmurs. Abdomen:  Bowel sounds positive; abdomen soft and non-tender without masses, organomegaly, or hernias noted. No hepatosplenomegaly. Msk:  Back normal, normal gait. Muscle strength and tone normal. Pulses:  pulses normal in all 4 extremities Extremities:  No clubbing or cyanosis.  Mild ecchymosis is present on the arms. Neurologic:  Alert and oriented x 3.    ICD Specifications Following MD:  Lewayne Bunting, MD     ICD Vendor:  [facility] [employer]     ICD Model Number:  [phone]     ICD Serial  Number:  [id number] ICD DOI:  12/05/2004     ICD Implanting MD:  Lewayne Bunting, MD  Lead 1:    Location: RA     DOI: 12/05/2004     Model #: 1488TC     Serial #: [id number]     Status: active Lead 2:    Location: RV     DOI: 12/05/2004     Model #: 7001     Serial #: [id number]     Status: active Lead 3:    Location: LV     DOI: 12/05/2004     Model #: 1056     Serial #: [id number]     Status: active  Indications::  NICM, CHF   ICD Follow Up Battery Voltage:  2.51 V     Charge Time:  12.9 seconds     Underlying rhythm:  SR ICD Dependent:  No       ICD Device Measurements Atrium:   Amplitude: 2.4 mV, Impedance: 425 ohms,  Right Ventricle:  Amplitude: 12.0 mV, Impedance: 520 ohms, Threshold: 0.5 V at 0.5 msec Left Ventricle:  Impedance: 840 ohms, Threshold: 1.75 V at 1.0 msec Configuration: BIPOLAR  Episodes MS Episodes:  0     Percent Mode Switch:  0     Coumadin:  No Shock:  0     ATP:  0     Nonsustained:  0     Atrial Therapies:  0 Atrial Pacing:  <1%     Ventricular Pacing:  90%  Brady Parameters Mode DDIR     Lower Rate Limit:  60     Upper Rate Limit 110 PAV 170     Sensed AV Delay:  120  Tachy Zones VF:  240     VT:  200     VT1:  160     Tech Comments:  DDIR MODE.  NORMAL DEVICE FUNCTION.  BATTERY CLOSE TO ERI.  PER GT DOESNT NEED OV ONCE REACHES ERI.  NO CHANGES MADE.  Vella Kohler  Sep 21, 2009 2:09 PM MD Comments:  Agree with above.  Impression & Recommendations:  Problem # 1:  AUTOMATIC IMPLANTABLE CARDIAC DEFIBRILLATOR SITU (ICD-V45.02) He is very close to ERI at this point.  I will schedule him foe generator change once we see ERI on Merlin monitoring.  Problem # 2:  CARDIOMYOPATHY, SECONDARY (ICD-425.9) His CHF symptoms remain well controlled.  A low sodium diet is requested. His updated medication list for this problem includes:    Coreg 6.25 Mg Tabs (Carvedilol) .Marland Kitchen... 1 by mouth two times a day    Lasix 40 Mg Tabs (Furosemide) .Marland Kitchen... Take 1 tablet by mouth once a day as directed    Aspirin Ec 325 Mg Tbec (Aspirin) .Marland Kitchen... Take one tablet by mouth daily    Ramipril 1.25 Mg Caps (Ramipril) .Marland Kitchen... Take one capsule by mouth two times a day  Problem # 3:  HYPERTENSION (ICD-401.9) His blood pressure is well controlled today.  Continue current meds. His updated medication list for this problem includes:    Coreg 6.25 Mg Tabs (Carvedilol) .Marland Kitchen... 1 by mouth two times a day    Lasix 40 Mg Tabs (Furosemide) .Marland Kitchen... Take 1 tablet by mouth once a day as directed    Aspirin Ec 325 Mg Tbec (Aspirin) .Marland Kitchen... Take one tablet by mouth daily    Ramipril 1.25 Mg  Caps (Ramipril) .Marland Kitchen... Take one capsule by mouth two times a day  Problem # 4:  ATRIAL FIBRILLATION (  ICD-427.31) He is not a coumadin candidate and his rate is well controlled. His updated medication list for this problem includes:    Coreg 6.25 Mg Tabs (Carvedilol) .Marland Kitchen... 1 by mouth two times a day    Aspirin Ec 325 Mg Tbec (Aspirin) .Marland Kitchen... Take one tablet by mouth daily  Patient Instructions: 1)  Your physician recommends that you schedule a follow-up appointment in:  will schedule a ICD generator change in 4-6 weeks 2)  Your physician recommends that you continue on your current medications as directed. Please refer to the Current Medication list given to you today. Prescriptions: LASIX 40 MG TABS (FUROSEMIDE) Take 1 tablet by mouth once a day as directed  #30 x 6   Entered by:   Lisabeth Devoid RN   Authorized by:   Laren Boom, MD, [facility]   Signed by:   Lisabeth Devoid RN on 09/21/2009   Method used:   Electronically to        [facility]  [phone]* (retail)       [address]       [place], [place]  [address]       Ph: [phone] or [phone]       Fax: [phone]   RxID:   [id number]

## 2010-06-05 NOTE — Progress Notes (Signed)
Summary: question - driving & meds  Phone Note Call from Patient Call back at Home Phone [phone]   Caller: Daughter- Samuel Jester  Reason for Call: Talk to Nurse Summary of Call: pt had procedure on yesterday- dtr has question regarding driving, meds .  pt seem to be fine.  Initial call taken by: Lorne Skeens,  January 30, 2010 2:56 PM  Follow-up for Phone Call        no driving for one week per Dr Ladona Ridgel.  Daughter not very happy with care at hospital.  States got all the way home and her Dads IV was still in his arm.  She drove him back to the hospital to have it removed.  She also stated that the d/c instructions state can drice after 24 hours and we have told her no driving for 7days.  She is ok now and I have answered her questions Dennis Bast, RN, BSN  January 30, 2010 3:24 PM

## 2010-06-05 NOTE — Assessment & Plan Note (Signed)
Summary: appt @ 3:30/rov/per paula/jml   Primary Provider:  Simone Curia, MD   History of Present Illness: Mr. Broz returns today for followup.  He is a pleasant 77 yo man with a h/o DCM, CHF, and is s/p BIV ICD.  The patient notes that his dyspnea remains prevalent. He has class 2 symptoms.  He denies c/p, peripheral edema or syncope.  He has not had any intercurrent ICD therapies.  Current Medications (verified): 1)  Potassium Chloride Cr 10 Meq Cr-Caps (Potassium Chloride) .... Take One Tablet By Mouth Daily 2)  Coreg 6.25 Mg Tabs (Carvedilol) .Marland Kitchen.. 1 By Mouth Two Times A Day 3)  Lasix 40 Mg Tabs (Furosemide) .Marland Kitchen.. 1 By Mouth Once Daily 4)  Aspirin Ec 325 Mg Tbec (Aspirin) .... Take One Tablet By Mouth Daily 5)  Niaspan 500 Mg Cr-Tabs (Niacin (Antihyperlipidemic)) .Marland Kitchen.. 1 By Mouth Once Daily 6)  Ramipril 1.25 Mg Caps (Ramipril) .... Take One Capsule By Mouth Two Times A Day 7)  Flomax 0.4 Mg Xr24h-Cap (Tamsulosin Hcl) .Marland Kitchen.. 1 By Mouth At Bedtime  Allergies (verified): No Known Drug Allergies  Past History:  Past Medical History: Last updated: 11/04/2008 AUTOMATIC IMPLANTABLE CARDIAC DEFIBRILLATOR SITU (ICD-V45.02) CARDIOMYOPATHY, SECONDARY (ICD-425.9) CHF (ICD-428.0) HYPERTENSION (ICD-401.9)  Past Surgical History: Last updated: 11/04/2008  1.  Right shoulder surgery.  2.  Bilateral knee arthroscopy  Colonoscopy with polypectomy.   Intraoperative echocardiogram.  Median sternotomy for mitral valve repair Implantation of a dual chamber biventricular ICD.  12/05/2004  Doylene Canning. Ladona Ridgel, M.D.   Review of Systems       The patient complains of dyspnea on exertion and peripheral edema.  The patient denies chest pain and syncope.    Vital Signs:  Patient profile:   77 year old male Height:      66.5 inches Weight:      185 pounds BMI:     29.52 Pulse rate:   70 / minute BP sitting:   140 / 78  (left arm) Cuff size:   regular  Vitals Entered By: Hardin Negus, RMA  (May 29, 2009 3:48 PM)  Physical Exam  General:  Well developed, well nourished, in no acute distress. Head:  normocephalic and atraumatic Eyes:  PERRLA/EOM intact; conjunctiva and lids normal. Mouth:  Teeth, gums and palate normal. Oral mucosa normal. Neck:  Neck supple, no JVD. No masses, thyromegaly or abnormal cervical nodes. Chest Wall:  Well healed ICD incision. Lungs:  Clear bilaterally.  No wheezes, rales, or rhonchi. Heart:  RRR with normal S1 and S2.  PMI is enlarged and laterally displaced.  No murmurs. Abdomen:  Bowel sounds positive; abdomen soft and non-tender without masses, organomegaly, or hernias noted. No hepatosplenomegaly. Msk:  Back normal, normal gait. Muscle strength and tone normal. Pulses:  pulses normal in all 4 extremities Extremities:  No clubbing or cyanosis.  Mild ecchymosis is present on the arms. Neurologic:  Alert and oriented x 3.    ICD Specifications Following MD:  Lewayne Bunting, MD     ICD Vendor:  [facility] [employer]     ICD Model Number:  [phone]     ICD Serial Number:  [id number] ICD DOI:  12/05/2004     ICD Implanting MD:  Lewayne Bunting, MD  Lead 1:    Location: RA     DOI: 12/05/2004     Model #: 1488TC     Serial #: [id number]     Status: active Lead 2:    Location: RV  DOI: 12/05/2004     Model #: 2956     Serial #: [id number]     Status: active Lead 3:    Location: LV     DOI: 12/05/2004     Model #: 1056     Serial #: [id number]     Status: active  Indications::  NICM, CHF   ICD Follow Up Remote Check?  No Battery Voltage:  2.55 V     Charge Time:  12.4 seconds     Underlying rhythm:  A-fib ICD Dependent:  No       ICD Device Measurements Atrium:  Amplitude: 1.4 mV, Impedance: 410 ohms,  Right Ventricle:  Amplitude: 12 mV, Impedance: 500 ohms, Threshold: 0.5 V at 0.5 msec Left Ventricle:  Impedance: 720 ohms, Threshold: 2.75 V at 1.0 msec Configuration: BIPOLAR  Episodes Coumadin:  No Shock:  0     ATP:  0     Nonsustained:  0     Atrial Pacing:   <1%     Ventricular Pacing:  92%  Brady Parameters Mode DDIR     Lower Rate Limit:  60     Upper Rate Limit 120 PAV 170     Sensed AV Delay:  120  Tachy Zones VF:  240     VT:  200     VT1:  160     Next Cardiology Appt Due:  07/04/2009 Tech Comments:  No parameter changes.  Battery near ERI.  We will check it again when he returns in 6 weeks.   Altha Harm, LPN  May 29, 2009 4:37 PM  MD Comments:  Agree with above.  Impression & Recommendations:  Problem # 1:  AUTOMATIC IMPLANTABLE CARDIAC DEFIBRILLATOR SITU (ICD-V45.02) His device is working normally.  Will recheck in several weeks.  Problem # 2:  CHF (ICD-428.0) I have asked him to increase his lasix dosage.  Will recheck his volume status. His updated medication list for this problem includes:    Coreg 6.25 Mg Tabs (Carvedilol) .Marland Kitchen... 1 by mouth two times a day    Lasix 40 Mg Tabs (Furosemide) .Marland Kitchen... As directed    Aspirin Ec 325 Mg Tbec (Aspirin) .Marland Kitchen... Take one tablet by mouth daily    Ramipril 1.25 Mg Caps (Ramipril) .Marland Kitchen... Take one capsule by mouth two times a day  Problem # 3:  HYPERTENSION (ICD-401.9) A low sodium diet is recommended.  He will continue his current meds. His updated medication list for this problem includes:    Coreg 6.25 Mg Tabs (Carvedilol) .Marland Kitchen... 1 by mouth two times a day    Lasix 40 Mg Tabs (Furosemide) .Marland Kitchen... As directed    Aspirin Ec 325 Mg Tbec (Aspirin) .Marland Kitchen... Take one tablet by mouth daily    Ramipril 1.25 Mg Caps (Ramipril) .Marland Kitchen... Take one capsule by mouth two times a day  Patient Instructions: 1)  Your physician has recommended you make the following change in your medication: Increase your Lasix to twice daily for 1 week then take as needed. After 1 week, only take your AM dose regularly and take a PM dose if you feel you need it.  2)  Your physician recommends that you schedule a follow-up appointment in: 6 weeks Prescriptions: LASIX 40 MG TABS (FUROSEMIDE) as directed  #60 x 6   Entered by:    Duncan Dull, RN, BSN   Authorized by:   Laren Boom, MD, [facility]   Signed by:   Duncan Dull, RN, BSN on  05/29/2009   Method used:   Electronically to        [facility]  [phone]* (retail)       [address]       [place], [place]  [address]       Ph: [phone] or [phone]       Fax: [phone]   RxID:   [id number]

## 2010-06-05 NOTE — Progress Notes (Signed)
Summary: QUESTIONS ABOUT PACE MAKER/ C/O Arthur Armstrong, TIRED  Phone Note Call from Patient Call back at [phone]   Caller: Daughter/SHEILA Summary of Call: PT DAUGHTER HAVE QUESTIONS ABOUT THE PT PACE MAKER Initial call taken by: Judie Grieve,  May 09, 2009 9:38 AM  Follow-up for Phone Call        per pt daughter  shelia williams. calling back, father is c/o weakness, tired.  also what are the symtoms of battery need to be replacement. test results. pt has appt on 1/6 ..  cell phone [phone] Lorne Skeens  May 10, 2009 8:55 AM  [facility] for daughter that his battery looked ok from his TTM on 05/08/09 and that it could be his device needs to be adjusted he may be feeling tierd because he is out of rhythm a large portion of time  Dennis Bast, RN, BSN  May 10, 2009 4:50 PM

## 2010-06-05 NOTE — Progress Notes (Signed)
Summary: RESULTS OF TEST  Phone Note Outgoing Call Call back at [facility] Phone [phone]   Caller: Daughter Velna Hatchet [phone] Reason for Call: Talk to Nurse Summary of Call: PT WAS IN YESTERDAY DTR WOULD LIKE RESULTS OF TEST Initial call taken by: Glynda Jaeger,  October 20, 2009 10:44 AM Summary of Call: Tried to call pt.  N/A Vella Kohler  October 20, 2009 1:28 PM  Follow-up for Phone Call        pt dtr calling back from previous msg can now be reached at [phone] Pt daughter calling back regarding pacemaker readings Judie Grieve  October 26, 2009 1:50 PM Follow-up by: Glynda Jaeger,  October 24, 2009 10:25 AM  Additional Follow-up for Phone Call Additional follow up Details #1::        pt calling again, request call back,[phone]  Migdalia Dk  October 27, 2009 2:33 PM  returned pt's call.  Vella Kohler  October 27, 2009 2:45 PM

## 2010-06-05 NOTE — Letter (Signed)
Summary: [facility]   Imported By: Marylou Mccoy 04/06/2010 14:29:11  _____________________________________________________________________  External Attachment:    Type:   Image     Comment:   External Document

## 2010-06-05 NOTE — Cardiovascular Report (Signed)
Summary: Office Visit   Office Visit   Imported By: Roderic Ovens 08/02/2009 15:59:19  _____________________________________________________________________  External Attachment:    Type:   Image     Comment:   External Document

## 2010-06-05 NOTE — Cardiovascular Report (Signed)
Summary: Office Visit   Office Visit   Imported By: Roderic Ovens 03/09/2010 09:38:30  _____________________________________________________________________  External Attachment:    Type:   Image     Comment:   External Document

## 2010-06-07 ENCOUNTER — Encounter (HOSPITAL_COMMUNITY): Payer: No Typology Code available for payment source | Attending: Internal Medicine

## 2010-06-07 DIAGNOSIS — Q211 Atrial septal defect: Secondary | ICD-10-CM | POA: Insufficient documentation

## 2010-06-07 DIAGNOSIS — Z5189 Encounter for other specified aftercare: Secondary | ICD-10-CM | POA: Insufficient documentation

## 2010-06-07 DIAGNOSIS — Z9889 Other specified postprocedural states: Secondary | ICD-10-CM | POA: Insufficient documentation

## 2010-06-07 DIAGNOSIS — I498 Other specified cardiac arrhythmias: Secondary | ICD-10-CM | POA: Insufficient documentation

## 2010-06-07 DIAGNOSIS — I440 Atrioventricular block, first degree: Secondary | ICD-10-CM | POA: Insufficient documentation

## 2010-06-07 DIAGNOSIS — E119 Type 2 diabetes mellitus without complications: Secondary | ICD-10-CM | POA: Insufficient documentation

## 2010-06-07 DIAGNOSIS — I472 Ventricular tachycardia, unspecified: Secondary | ICD-10-CM | POA: Insufficient documentation

## 2010-06-07 DIAGNOSIS — I442 Atrioventricular block, complete: Secondary | ICD-10-CM | POA: Insufficient documentation

## 2010-06-07 DIAGNOSIS — I2789 Other specified pulmonary heart diseases: Secondary | ICD-10-CM | POA: Insufficient documentation

## 2010-06-07 DIAGNOSIS — Z8249 Family history of ischemic heart disease and other diseases of the circulatory system: Secondary | ICD-10-CM | POA: Insufficient documentation

## 2010-06-07 DIAGNOSIS — Z95810 Presence of automatic (implantable) cardiac defibrillator: Secondary | ICD-10-CM | POA: Insufficient documentation

## 2010-06-07 DIAGNOSIS — E669 Obesity, unspecified: Secondary | ICD-10-CM | POA: Insufficient documentation

## 2010-06-07 DIAGNOSIS — I509 Heart failure, unspecified: Secondary | ICD-10-CM | POA: Insufficient documentation

## 2010-06-07 DIAGNOSIS — I4729 Other ventricular tachycardia: Secondary | ICD-10-CM | POA: Insufficient documentation

## 2010-06-07 DIAGNOSIS — Z7901 Long term (current) use of anticoagulants: Secondary | ICD-10-CM | POA: Insufficient documentation

## 2010-06-07 DIAGNOSIS — I428 Other cardiomyopathies: Secondary | ICD-10-CM | POA: Insufficient documentation

## 2010-06-07 DIAGNOSIS — Q2111 Secundum atrial septal defect: Secondary | ICD-10-CM | POA: Insufficient documentation

## 2010-06-07 DIAGNOSIS — I10 Essential (primary) hypertension: Secondary | ICD-10-CM | POA: Insufficient documentation

## 2010-06-07 NOTE — Cardiovascular Report (Signed)
Summary: Office Visit   Office Visit   Imported By: Roderic Ovens 05/11/2010 12:34:34  _____________________________________________________________________  External Attachment:    Type:   Image     Comment:   External Document

## 2010-06-07 NOTE — Assessment & Plan Note (Signed)
Summary: [email or web address] [email or web address]   Visit Type:  Follow-up Primary Provider:  Simone Curia, MD   History of Present Illness: Arthur Armstrong returns today for followup.  He is a pleasant 77 yo man with a h/o DCM, CHF, and is s/p BIV ICD.  He has atrial fibrillation but has been intolerant to a strategy of rhythm control and coumadin in the past.  The patient notes that his dyspnea remains with exertion. He has class 2 symptoms.  He denies c/p, peripheral edema or syncope.  He has not had any intercurrent ICD therapies. When I saw him last several  months ago, he was having more CHF symptoms and he had his dose of lasix increased though there was some compliance issues.  He has done a nice job of trying to avoid sodium.  He has not had any intercurrent ICD therapies.  Current Medications (verified): 1)  Potassium Chloride Cr 10 Meq Cr-Caps (Potassium Chloride) .... Take One Tablet By Mouth Daily 2)  Coreg 6.25 Mg Tabs (Carvedilol) .Marland Kitchen.. 1 By Mouth Two Times A Day 3)  Lasix 40 Mg Tabs (Furosemide) .... Take 1 Tablet By Mouth Every Morning and 1 Tablet By Mouth At Lunch 4)  Aspirin Ec 325 Mg Tbec (Aspirin) .... Take One Tablet By Mouth Daily 5)  Niaspan 500 Mg Cr-Tabs (Niacin (Antihyperlipidemic)) .Marland Kitchen.. 1 By Mouth Once Daily 6)  Flomax 0.4 Mg Xr24h-Cap (Tamsulosin Hcl) .Marland Kitchen.. 1 By Mouth At Bedtime 7)  Ramipril 2.5 Mg Caps (Ramipril) .... Take 1 Capsule By Mouth Two Times A Day  Allergies (verified): No Known Drug Allergies  Past History:  Past Medical History: Last updated: 11/04/2008 AUTOMATIC IMPLANTABLE CARDIAC DEFIBRILLATOR SITU (ICD-V45.02) CARDIOMYOPATHY, SECONDARY (ICD-425.9) CHF (ICD-428.0) HYPERTENSION (ICD-401.9)  Past Surgical History: Last updated: 11/04/2008  1.  Right shoulder surgery.  2.  Bilateral knee arthroscopy  Colonoscopy with polypectomy.   Intraoperative echocardiogram.  Median sternotomy for mitral valve repair Implantation of a dual chamber biventricular ICD.   12/05/2004  Arthur Armstrong. Arthur Armstrong, M.D.   Review of Systems       The patient complains of dyspnea on exertion.  The patient denies chest pain, syncope, and peripheral edema.    Vital Signs:  Patient profile:   77 year old male Height:      66.5 inches Weight:      178 pounds BMI:     28.40 Pulse rate:   69 / minute BP sitting:   117 / 80  (left arm)  Vitals Entered By: Laurance Flatten CMA (May 09, 2010 3:41 PM)  Physical Exam  General:  Well developed, well nourished, in no acute distress. Head:  normocephalic and atraumatic Eyes:  PERRLA/EOM intact; conjunctiva and lids normal. Mouth:  Teeth, gums and palate normal. Oral mucosa normal. Neck:  Neck supple, no JVD. No masses, thyromegaly or abnormal cervical nodes. Chest Wall:  Well healed ICD incision. Lungs:  Clear bilaterally.  No wheezes, rales, or rhonchi. Heart:  RRR with normal S1 and S2.  PMI is enlarged and laterally displaced.  No murmurs. Abdomen:  Bowel sounds positive; abdomen soft and non-tender without masses, organomegaly, or hernias noted. No hepatosplenomegaly. Msk:  Back normal, normal gait. Muscle strength and tone normal. Pulses:  pulses normal in all 4 extremities Extremities:  No clubbing or cyanosis.  Mild ecchymosis is present on the arms. Neurologic:  Alert and oriented x 3.    ICD Specifications Following MD:  Lewayne Bunting, MD     ICD Vendor:  [facility]  ICD Model Number:  [id number]     ICD Serial Number:  [id number] ICD DOI:  01/29/2010     ICD Implanting MD:  Lewayne Bunting, MD  Lead 1:    Location: RA     DOI: 12/05/2004     Model #: 1488TC     Serial #: [id number]     Status: active Lead 2:    Location: RV     DOI: 12/05/2004     Model #: 7001     Serial #: [id number]     Status: active Lead 3:    Location: LV     DOI: 12/05/2004     Model #: 1056     Serial #: [id number]     Status: active  Indications::  NICM, CHF  Explantation Comments: 01/29/10 [employer] Atlas [id number] explanted  ICD Follow Up Battery  Voltage:  94% V     Charge Time:  8.5 seconds     Battery Est. Longevity:  6.0 yrs Underlying rhythm:  AF ICD Dependent:  No       ICD Device Measurements Atrium:  Amplitude: 0.8 mV, Impedance: 400 ohms,  Right Ventricle:  Amplitude: 12.0 mV, Impedance: 0.5 ohms, Threshold: 0.5 V at 0.5 msec Left Ventricle:  Impedance: 360 ohms, Threshold: 1.25 V at 0.8 msec Configuration: BIPOLAR Shock Impedance: 43 ohms   Episodes MS Episodes:  5      Percent Mode Switch:  100%     Coumadin:  No Shock:  0     ATP:  0     Nonsustained:  0     Atrial Therapies:  0 Atrial Pacing:  0%     Ventricular Pacing:  77%  Brady Parameters Mode DDD     Lower Rate Limit:  60     Upper Rate Limit 120 PAV 170     Sensed AV Delay:  150  Tachy Zones VF:  240     VT:  200     VT1:  160     Next Cardiology Appt Due:  08/06/2010 Tech Comments:  PT IN AF 100% OF TIME.  + ASA.  NORMAL DEVICE FUNCTION.  PT COMPLAINING OF SOB.  NO CHANGES MADE. ROV IN 3 MTHS W/GT. Vella Kohler  May 09, 2010 4:24 PM MD Comments:  Agree with above.  Impression & Recommendations:  Problem # 1:  AUTOMATIC IMPLANTABLE CARDIAC DEFIBRILLATOR SITU (ICD-V45.02) His device is working normally.  Will recheck in several months.  Problem # 2:  CHF (ICD-428.0) His symptoms remain class 2-3.  I will ask him to take an extra lasix on days he goes to cardiac rehab. His updated medication list for this problem includes:    Coreg 6.25 Mg Tabs (Carvedilol) .Marland Kitchen... 1 by mouth two times a day    Lasix 40 Mg Tabs (Furosemide) .Marland Kitchen... Take 1 tablet by mouth every morning and 1 tablet by mouth at lunch    Aspirin Ec 325 Mg Tbec (Aspirin) .Marland Kitchen... Take one tablet by mouth daily    Ramipril 2.5 Mg Caps (Ramipril) .Marland Kitchen... Take 1 capsule by mouth two times a day  Problem # 3:  HYPERTENSION (ICD-401.9) Ihave asked him to maintain a low sodium diet and he will continue his current meds. His updated medication list for this problem includes:    Coreg 6.25 Mg Tabs  (Carvedilol) .Marland Kitchen... 1 by mouth two times a day    Lasix 40 Mg Tabs (Furosemide) .Marland Kitchen... Take 1 tablet by mouth every morning  and 1 tablet by mouth at lunch    Aspirin Ec 325 Mg Tbec (Aspirin) .Marland Kitchen... Take one tablet by mouth daily    Ramipril 2.5 Mg Caps (Ramipril) .Marland Kitchen... Take 1 capsule by mouth two times a day  Patient Instructions: 1)  Your physician recommends that you schedule a follow-up appointment in: 3 months with Dr Arthur Armstrong 2)  Your physician has recommended you make the following change in your medication: take 2 fluid pills on Mon Tues and Thurs in the morning prior to cardiac rehab  If needed ok to take an additional one in the afternoon  Call if you need more pills called in to drug store

## 2010-06-07 NOTE — Miscellaneous (Signed)
Summary: [facility] Progress Report   [facility] Progress Report   Imported By: Roderic Ovens 04/18/2010 10:39:45  _____________________________________________________________________  External Attachment:    Type:   Image     Comment:   External Document

## 2010-06-11 ENCOUNTER — Encounter (HOSPITAL_COMMUNITY): Payer: No Typology Code available for payment source

## 2010-06-12 ENCOUNTER — Encounter (HOSPITAL_COMMUNITY): Payer: No Typology Code available for payment source

## 2010-06-14 ENCOUNTER — Encounter (HOSPITAL_COMMUNITY): Payer: No Typology Code available for payment source

## 2010-06-16 ENCOUNTER — Emergency Department (HOSPITAL_COMMUNITY): Payer: No Typology Code available for payment source

## 2010-06-16 ENCOUNTER — Inpatient Hospital Stay (HOSPITAL_COMMUNITY)
Admission: EM | Admit: 2010-06-16 | Discharge: 2010-06-22 | DRG: 963 | Disposition: A | Discharge status: Home / Self Care | Payer: No Typology Code available for payment source | Source: Ambulatory Visit | Attending: Surgery | Admitting: Surgery

## 2010-06-16 DIAGNOSIS — Z95810 Presence of automatic (implantable) cardiac defibrillator: Secondary | ICD-10-CM

## 2010-06-16 DIAGNOSIS — I2510 Atherosclerotic heart disease of native coronary artery without angina pectoris: Secondary | ICD-10-CM | POA: Diagnosis present

## 2010-06-16 DIAGNOSIS — S36113A Laceration of liver, unspecified degree, initial encounter: Principal | ICD-10-CM | POA: Diagnosis present

## 2010-06-16 DIAGNOSIS — I5023 Acute on chronic systolic (congestive) heart failure: Secondary | ICD-10-CM | POA: Diagnosis present

## 2010-06-16 DIAGNOSIS — S01119A Laceration without foreign body of unspecified eyelid and periocular area, initial encounter: Secondary | ICD-10-CM | POA: Diagnosis present

## 2010-06-16 DIAGNOSIS — R7309 Other abnormal glucose: Secondary | ICD-10-CM | POA: Diagnosis present

## 2010-06-16 DIAGNOSIS — G4733 Obstructive sleep apnea (adult) (pediatric): Secondary | ICD-10-CM | POA: Diagnosis present

## 2010-06-16 DIAGNOSIS — Y9301 Activity, walking, marching and hiking: Secondary | ICD-10-CM

## 2010-06-16 DIAGNOSIS — Y92009 Unspecified place in unspecified non-institutional (private) residence as the place of occurrence of the external cause: Secondary | ICD-10-CM

## 2010-06-16 DIAGNOSIS — Z7982 Long term (current) use of aspirin: Secondary | ICD-10-CM

## 2010-06-16 DIAGNOSIS — Z79899 Other long term (current) drug therapy: Secondary | ICD-10-CM

## 2010-06-16 DIAGNOSIS — W19XXXA Unspecified fall, initial encounter: Secondary | ICD-10-CM | POA: Diagnosis present

## 2010-06-16 DIAGNOSIS — I10 Essential (primary) hypertension: Secondary | ICD-10-CM | POA: Diagnosis present

## 2010-06-16 DIAGNOSIS — I428 Other cardiomyopathies: Secondary | ICD-10-CM | POA: Diagnosis present

## 2010-06-16 DIAGNOSIS — I509 Heart failure, unspecified: Secondary | ICD-10-CM | POA: Diagnosis present

## 2010-06-16 DIAGNOSIS — E876 Hypokalemia: Secondary | ICD-10-CM | POA: Diagnosis not present

## 2010-06-16 DIAGNOSIS — Z8673 Personal history of transient ischemic attack (TIA), and cerebral infarction without residual deficits: Secondary | ICD-10-CM

## 2010-06-16 DIAGNOSIS — I4891 Unspecified atrial fibrillation: Secondary | ICD-10-CM | POA: Diagnosis not present

## 2010-06-16 DIAGNOSIS — D62 Acute posthemorrhagic anemia: Secondary | ICD-10-CM | POA: Diagnosis not present

## 2010-06-16 DIAGNOSIS — S37019A Minor contusion of unspecified kidney, initial encounter: Secondary | ICD-10-CM | POA: Diagnosis present

## 2010-06-16 LAB — BRAIN NATRIURETIC PEPTIDE: Pro B Natriuretic peptide (BNP): 685 pg/mL — ABNORMAL HIGH (ref 0.0–100.0)

## 2010-06-16 LAB — DIFFERENTIAL
Basophils Absolute: 0 10*3/uL (ref 0.0–0.1)
Basophils Relative: 1 % (ref 0–1)
Eosinophils Absolute: 0.1 10*3/uL (ref 0.0–0.7)
Lymphocytes Relative: 9 % — ABNORMAL LOW (ref 12–46)
Lymphs Abs: 0.8 10*3/uL (ref 0.7–4.0)
Monocytes Absolute: 0.8 10*3/uL (ref 0.1–1.0)

## 2010-06-16 LAB — GLUCOSE, CAPILLARY

## 2010-06-16 LAB — CBC
Hemoglobin: 12.5 g/dL — ABNORMAL LOW (ref 13.0–17.0)
MCH: 29.1 pg (ref 26.0–34.0)
MCV: 89.5 fL (ref 78.0–100.0)
RBC: 4.3 MIL/uL (ref 4.22–5.81)
RDW: 15.1 % (ref 11.5–15.5)
WBC: 8.2 10*3/uL (ref 4.0–10.5)

## 2010-06-16 LAB — COMPREHENSIVE METABOLIC PANEL
ALT: 95 U/L — ABNORMAL HIGH (ref 0–53)
AST: 172 U/L — ABNORMAL HIGH (ref 0–37)
Calcium: 8.4 mg/dL (ref 8.4–10.5)
Creatinine, Ser: 1.3 mg/dL (ref 0.4–1.5)
GFR calc Af Amer: >60 mL/min (ref >60)
Sodium: 136 mEq/L (ref 135–145)
Total Protein: 6.8 g/dL (ref 6.0–8.3)

## 2010-06-16 LAB — POCT CARDIAC MARKERS: Myoglobin, poc: 107 ng/mL (ref 12–200)

## 2010-06-16 LAB — URINALYSIS, ROUTINE W REFLEX MICROSCOPIC
Ketones, ur: NEGATIVE mg/dL
Nitrite: NEGATIVE
Specific Gravity, Urine: 1.009 (ref 1.005–1.030)
Urobilinogen, UA: 1 mg/dL (ref 0.0–1.0)
pH: 7 (ref 5.0–8.0)

## 2010-06-16 LAB — CK TOTAL AND CKMB (NOT AT ARMC): Relative Index: 3 — ABNORMAL HIGH (ref 0.0–2.5)

## 2010-06-16 LAB — LACTIC ACID, PLASMA: Lactic Acid, Venous: 2.6 mmol/L — ABNORMAL HIGH (ref 0.5–2.2)

## 2010-06-16 LAB — PROTIME-INR: INR: 1.59 — ABNORMAL HIGH (ref 0.00–1.49)

## 2010-06-16 LAB — APTT: aPTT: 32 seconds (ref 24–37)

## 2010-06-16 LAB — POCT I-STAT, CHEM 8
BUN: 25 mg/dL — ABNORMAL HIGH (ref 6–23)
Calcium, Ion: 1.07 mmol/L — ABNORMAL LOW (ref 1.12–1.32)

## 2010-06-16 LAB — TROPONIN I: Troponin I: 0.01 ng/mL (ref 0.00–0.06)

## 2010-06-17 DIAGNOSIS — I509 Heart failure, unspecified: Secondary | ICD-10-CM

## 2010-06-17 LAB — CBC
Hemoglobin: 12.3 g/dL — ABNORMAL LOW (ref 13.0–17.0)
Hemoglobin: 11.8 g/dL — ABNORMAL LOW (ref 13.0–17.0)
Hemoglobin: 11.1 g/dL — ABNORMAL LOW (ref 13.0–17.0)
MCH: 28.7 pg (ref 26.0–34.0)
MCH: 29.1 pg (ref 26.0–34.0)
MCV: 88.8 fL (ref 78.0–100.0)
Platelets: 138 10*3/uL — ABNORMAL LOW (ref 150–400)
Platelets: 132 10*3/uL — ABNORMAL LOW (ref 150–400)
Platelets: 134 10*3/uL — ABNORMAL LOW (ref 150–400)
RBC: 4.28 MIL/uL (ref 4.22–5.81)
RBC: 4.05 MIL/uL — ABNORMAL LOW (ref 4.22–5.81)
RBC: 3.83 MIL/uL — ABNORMAL LOW (ref 4.22–5.81)
WBC: 6.8 10*3/uL (ref 4.0–10.5)
WBC: 8.1 10*3/uL (ref 4.0–10.5)
WBC: 8.7 10*3/uL (ref 4.0–10.5)

## 2010-06-17 LAB — CARDIAC PANEL(CRET KIN+CKTOT+MB+TROPI)
CK, MB: 3 ng/mL (ref 0.3–4.0)
Relative Index: 3.6 — ABNORMAL HIGH (ref 0.0–2.5)
Troponin I: 0.01 ng/mL (ref 0.00–0.06)
Troponin I: 0.02 ng/mL (ref 0.00–0.06)

## 2010-06-17 LAB — COMPREHENSIVE METABOLIC PANEL
Albumin: 3.5 g/dL (ref 3.5–5.2)
Alkaline Phosphatase: 101 U/L (ref 39–117)
BUN: 18 mg/dL (ref 6–23)
CO2: 25 mEq/L (ref 19–32)
Chloride: 102 mEq/L (ref 96–112)
Creatinine, Ser: 1.08 mg/dL (ref 0.4–1.5)
GFR calc non Af Amer: >60 mL/min (ref >60)
Glucose, Bld: 225 mg/dL — ABNORMAL HIGH (ref 70–99)
Potassium: 3.6 mEq/L (ref 3.5–5.1)
Total Bilirubin: 1.7 mg/dL — ABNORMAL HIGH (ref 0.3–1.2)

## 2010-06-17 LAB — GLUCOSE, CAPILLARY
Glucose-Capillary: 184 mg/dL — ABNORMAL HIGH (ref 70–99)
Glucose-Capillary: 136 mg/dL — ABNORMAL HIGH (ref 70–99)

## 2010-06-17 LAB — POCT CARDIAC MARKERS
CKMB, poc: 1.1 ng/mL (ref 1.0–8.0)
Troponin i, poc: <0.05 ng/mL (ref 0.00–0.09)

## 2010-06-17 LAB — HEMOGLOBIN A1C
Hgb A1c MFr Bld: 7 % — ABNORMAL HIGH (ref <5.7)
Mean Plasma Glucose: 154 mg/dL — ABNORMAL HIGH (ref <117)

## 2010-06-17 LAB — PROTIME-INR: INR: 1.49 (ref 0.00–1.49)

## 2010-06-17 LAB — APTT: aPTT: 31 seconds (ref 24–37)

## 2010-06-17 LAB — TSH: TSH: 1.311 u[IU]/mL (ref 0.350–4.500)

## 2010-06-17 LAB — MRSA PCR SCREENING: MRSA by PCR: NEGATIVE

## 2010-06-17 MED ORDER — IOHEXOL 300 MG/ML  SOLN: 100.0000 mL | Freq: Once | INTRAMUSCULAR | Status: AC | PRN

## 2010-06-18 ENCOUNTER — Encounter (HOSPITAL_COMMUNITY): Payer: No Typology Code available for payment source

## 2010-06-18 DIAGNOSIS — I5022 Chronic systolic (congestive) heart failure: Secondary | ICD-10-CM

## 2010-06-18 DIAGNOSIS — I517 Cardiomegaly: Secondary | ICD-10-CM

## 2010-06-18 LAB — GLUCOSE, CAPILLARY: Glucose-Capillary: 91 mg/dL (ref 70–99)

## 2010-06-18 LAB — CBC
HCT: 32.5 % — ABNORMAL LOW (ref 39.0–52.0)
Hemoglobin: 10.3 g/dL — ABNORMAL LOW (ref 13.0–17.0)
Hemoglobin: 10.4 g/dL — ABNORMAL LOW (ref 13.0–17.0)
Hemoglobin: 10.5 g/dL — ABNORMAL LOW (ref 13.0–17.0)
MCH: 29.2 pg (ref 26.0–34.0)
MCHC: 31.9 g/dL (ref 30.0–36.0)
MCHC: 31.9 g/dL (ref 30.0–36.0)
MCV: 90.8 fL (ref 78.0–100.0)
Platelets: 114 10*3/uL — ABNORMAL LOW (ref 150–400)
Platelets: 109 10*3/uL — ABNORMAL LOW (ref 150–400)
RBC: 3.58 MIL/uL — ABNORMAL LOW (ref 4.22–5.81)
RBC: 3.58 MIL/uL — ABNORMAL LOW (ref 4.22–5.81)
RBC: 3.6 MIL/uL — ABNORMAL LOW (ref 4.22–5.81)
RDW: 15.4 % (ref 11.5–15.5)
WBC: 6.7 10*3/uL (ref 4.0–10.5)
WBC: 6.9 10*3/uL (ref 4.0–10.5)
WBC: 7.5 10*3/uL (ref 4.0–10.5)

## 2010-06-18 LAB — URINE CULTURE
Colony Count: 3000
Culture  Setup Time: 201202121126
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
## 2010-06-19 ENCOUNTER — Encounter (HOSPITAL_COMMUNITY): Payer: No Typology Code available for payment source

## 2010-06-19 LAB — GLUCOSE, CAPILLARY
Glucose-Capillary: 118 mg/dL — ABNORMAL HIGH (ref 70–99)
Glucose-Capillary: 133 mg/dL — ABNORMAL HIGH (ref 70–99)
Glucose-Capillary: 92 mg/dL (ref 70–99)
Glucose-Capillary: 115 mg/dL — ABNORMAL HIGH (ref 70–99)
Glucose-Capillary: 108 mg/dL — ABNORMAL HIGH (ref 70–99)

## 2010-06-19 LAB — CBC
HCT: 33.6 % — ABNORMAL LOW (ref 39.0–52.0)
HCT: 32.1 % — ABNORMAL LOW (ref 39.0–52.0)
Hemoglobin: 10.3 g/dL — ABNORMAL LOW (ref 13.0–17.0)
Hemoglobin: 10.4 g/dL — ABNORMAL LOW (ref 13.0–17.0)
Hemoglobin: 9.8 g/dL — ABNORMAL LOW (ref 13.0–17.0)
MCH: 28 pg (ref 26.0–34.0)
MCH: 28.6 pg (ref 26.0–34.0)
MCH: 28.7 pg (ref 26.0–34.0)
MCH: 28.9 pg (ref 26.0–34.0)
MCHC: 31 g/dL (ref 30.0–36.0)
MCHC: 31.3 g/dL (ref 30.0–36.0)
MCHC: 32.1 g/dL (ref 30.0–36.0)
MCHC: 32.2 g/dL (ref 30.0–36.0)
MCV: 89.1 fL (ref 78.0–100.0)
MCV: 89.9 fL (ref 78.0–100.0)
MCV: 91.3 fL (ref 78.0–100.0)
Platelets: 118 10*3/uL — ABNORMAL LOW (ref 150–400)
RBC: 3.67 MIL/uL — ABNORMAL LOW (ref 4.22–5.81)
RBC: 3.57 MIL/uL — ABNORMAL LOW (ref 4.22–5.81)
RDW: 15.3 % (ref 11.5–15.5)
RDW: 15.3 % (ref 11.5–15.5)

## 2010-06-20 ENCOUNTER — Inpatient Hospital Stay (HOSPITAL_COMMUNITY): Payer: No Typology Code available for payment source

## 2010-06-20 DIAGNOSIS — I509 Heart failure, unspecified: Secondary | ICD-10-CM

## 2010-06-20 LAB — CBC
HCT: 32.4 % — ABNORMAL LOW (ref 39.0–52.0)
Hemoglobin: 10 g/dL — ABNORMAL LOW (ref 13.0–17.0)
MCH: 28.7 pg (ref 26.0–34.0)
MCH: 28.8 pg (ref 26.0–34.0)
MCHC: 32.1 g/dL (ref 30.0–36.0)
MCV: 89.1 fL (ref 78.0–100.0)
MCV: 90.1 fL (ref 78.0–100.0)
Platelets: 113 10*3/uL — ABNORMAL LOW (ref 150–400)
Platelets: 116 10*3/uL — ABNORMAL LOW (ref 150–400)
Platelets: 121 10*3/uL — ABNORMAL LOW (ref 150–400)
Platelets: 136 10*3/uL — ABNORMAL LOW (ref 150–400)
RBC: 3.49 MIL/uL — ABNORMAL LOW (ref 4.22–5.81)
RBC: 3.49 MIL/uL — ABNORMAL LOW (ref 4.22–5.81)
RBC: 3.56 MIL/uL — ABNORMAL LOW (ref 4.22–5.81)
RBC: 3.82 MIL/uL — ABNORMAL LOW (ref 4.22–5.81)
RDW: 15.1 % (ref 11.5–15.5)
RDW: 15.1 % (ref 11.5–15.5)
WBC: 4.5 10*3/uL (ref 4.0–10.5)
WBC: 5.6 10*3/uL (ref 4.0–10.5)

## 2010-06-20 LAB — MAGNESIUM: Magnesium: 2.1 mg/dL (ref 1.5–2.5)

## 2010-06-20 LAB — GLUCOSE, CAPILLARY: Glucose-Capillary: 96 mg/dL (ref 70–99)

## 2010-06-20 LAB — PHOSPHORUS: Phosphorus: 3 mg/dL (ref 2.3–4.6)

## 2010-06-20 LAB — BASIC METABOLIC PANEL
Chloride: 100 mEq/L (ref 96–112)
Creatinine, Ser: 1.11 mg/dL (ref 0.4–1.5)
GFR calc Af Amer: >60 mL/min (ref >60)
Potassium: 3.3 mEq/L — ABNORMAL LOW (ref 3.5–5.1)

## 2010-06-21 ENCOUNTER — Encounter (HOSPITAL_COMMUNITY): Payer: No Typology Code available for payment source

## 2010-06-21 ENCOUNTER — Inpatient Hospital Stay (HOSPITAL_COMMUNITY): Payer: No Typology Code available for payment source

## 2010-06-21 LAB — CBC
HCT: 32.5 % — ABNORMAL LOW (ref 39.0–52.0)
HCT: 33.9 % — ABNORMAL LOW (ref 39.0–52.0)
HCT: 34.6 % — ABNORMAL LOW (ref 39.0–52.0)
HCT: 32.9 % — ABNORMAL LOW (ref 39.0–52.0)
Hemoglobin: 11 g/dL — ABNORMAL LOW (ref 13.0–17.0)
Hemoglobin: 10.6 g/dL — ABNORMAL LOW (ref 13.0–17.0)
MCH: 28.7 pg (ref 26.0–34.0)
MCH: 28.4 pg (ref 26.0–34.0)
MCH: 28.7 pg (ref 26.0–34.0)
MCHC: 32 g/dL (ref 30.0–36.0)
MCHC: 31.9 g/dL (ref 30.0–36.0)
MCHC: 31.8 g/dL (ref 30.0–36.0)
MCHC: 32.2 g/dL (ref 30.0–36.0)
MCV: 89.8 fL (ref 78.0–100.0)
MCV: 89.2 fL (ref 78.0–100.0)
MCV: 90.1 fL (ref 78.0–100.0)
RBC: 3.69 MIL/uL — ABNORMAL LOW (ref 4.22–5.81)
RDW: 15.1 % (ref 11.5–15.5)
RDW: 15.1 % (ref 11.5–15.5)
RDW: 15.1 % (ref 11.5–15.5)

## 2010-06-21 LAB — BASIC METABOLIC PANEL
BUN: 16 mg/dL (ref 6–23)
Calcium: 8.7 mg/dL (ref 8.4–10.5)
Creatinine, Ser: 1.04 mg/dL (ref 0.4–1.5)
GFR calc non Af Amer: >60 mL/min (ref >60)
Glucose, Bld: 97 mg/dL (ref 70–99)

## 2010-06-21 LAB — GLUCOSE, CAPILLARY: Glucose-Capillary: 107 mg/dL — ABNORMAL HIGH (ref 70–99)

## 2010-06-22 LAB — BASIC METABOLIC PANEL
BUN: 14 mg/dL (ref 6–23)
CO2: 33 mEq/L — ABNORMAL HIGH (ref 19–32)
Chloride: 101 mEq/L (ref 96–112)
GFR calc non Af Amer: >60 mL/min (ref >60)
Glucose, Bld: 113 mg/dL — ABNORMAL HIGH (ref 70–99)
Potassium: 3.4 mEq/L — ABNORMAL LOW (ref 3.5–5.1)
Sodium: 141 mEq/L (ref 135–145)

## 2010-06-22 LAB — CBC
HCT: 31.9 % — ABNORMAL LOW (ref 39.0–52.0)
Hemoglobin: 10.4 g/dL — ABNORMAL LOW (ref 13.0–17.0)
Hemoglobin: 10.8 g/dL — ABNORMAL LOW (ref 13.0–17.0)
MCH: 29 pg (ref 26.0–34.0)
MCHC: 32.9 g/dL (ref 30.0–36.0)
MCV: 87.9 fL (ref 78.0–100.0)
MCV: 88.9 fL (ref 78.0–100.0)
RBC: 3.73 MIL/uL — ABNORMAL LOW (ref 4.22–5.81)
RDW: 15.1 % (ref 11.5–15.5)
WBC: 4.7 10*3/uL (ref 4.0–10.5)

## 2010-06-22 LAB — GLUCOSE, CAPILLARY: Glucose-Capillary: 101 mg/dL — ABNORMAL HIGH (ref 70–99)

## 2010-06-25 ENCOUNTER — Encounter (HOSPITAL_COMMUNITY): Payer: No Typology Code available for payment source

## 2010-06-26 ENCOUNTER — Encounter (HOSPITAL_COMMUNITY): Payer: No Typology Code available for payment source

## 2010-06-28 ENCOUNTER — Encounter (HOSPITAL_COMMUNITY): Payer: No Typology Code available for payment source
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
## 2010-07-02 ENCOUNTER — Ambulatory Visit (INDEPENDENT_AMBULATORY_CARE_PROVIDER_SITE_OTHER): Payer: No Typology Code available for payment source | Admitting: Physician Assistant

## 2010-07-02 ENCOUNTER — Other Ambulatory Visit: Payer: Self-pay | Admitting: Physician Assistant

## 2010-07-02 ENCOUNTER — Encounter (HOSPITAL_COMMUNITY): Payer: No Typology Code available for payment source

## 2010-07-02 ENCOUNTER — Ambulatory Visit: Payer: No Typology Code available for payment source | Admitting: Physician Assistant

## 2010-07-02 ENCOUNTER — Encounter: Payer: Self-pay | Admitting: Physician Assistant

## 2010-07-02 DIAGNOSIS — I5022 Chronic systolic (congestive) heart failure: Secondary | ICD-10-CM

## 2010-07-02 DIAGNOSIS — I428 Other cardiomyopathies: Secondary | ICD-10-CM

## 2010-07-02 DIAGNOSIS — I10 Essential (primary) hypertension: Secondary | ICD-10-CM

## 2010-07-03 ENCOUNTER — Encounter (HOSPITAL_COMMUNITY): Payer: No Typology Code available for payment source

## 2010-07-03 LAB — BASIC METABOLIC PANEL
BUN: 24 mg/dL — ABNORMAL HIGH (ref 6–23)
CO2: 31 mEq/L (ref 19–32)
Chloride: 102 mEq/L (ref 96–112)
GFR: 58.79 mL/min — ABNORMAL LOW (ref >60.00)
Glucose, Bld: 83 mg/dL (ref 70–99)
Potassium: 4.8 mEq/L (ref 3.5–5.1)
Sodium: 139 mEq/L (ref 135–145)

## 2010-07-05 ENCOUNTER — Encounter (HOSPITAL_COMMUNITY): Payer: No Typology Code available for payment source

## 2010-07-09 ENCOUNTER — Encounter (HOSPITAL_COMMUNITY): Payer: No Typology Code available for payment source

## 2010-07-10 ENCOUNTER — Encounter (HOSPITAL_COMMUNITY): Payer: No Typology Code available for payment source | Attending: Internal Medicine

## 2010-07-10 DIAGNOSIS — E119 Type 2 diabetes mellitus without complications: Secondary | ICD-10-CM | POA: Insufficient documentation

## 2010-07-10 DIAGNOSIS — I498 Other specified cardiac arrhythmias: Secondary | ICD-10-CM | POA: Insufficient documentation

## 2010-07-10 DIAGNOSIS — I4729 Other ventricular tachycardia: Secondary | ICD-10-CM | POA: Insufficient documentation

## 2010-07-10 DIAGNOSIS — Z7901 Long term (current) use of anticoagulants: Secondary | ICD-10-CM | POA: Insufficient documentation

## 2010-07-10 DIAGNOSIS — I472 Ventricular tachycardia, unspecified: Secondary | ICD-10-CM | POA: Insufficient documentation

## 2010-07-10 DIAGNOSIS — Q2111 Secundum atrial septal defect: Secondary | ICD-10-CM | POA: Insufficient documentation

## 2010-07-10 DIAGNOSIS — I10 Essential (primary) hypertension: Secondary | ICD-10-CM | POA: Insufficient documentation

## 2010-07-10 DIAGNOSIS — I442 Atrioventricular block, complete: Secondary | ICD-10-CM | POA: Insufficient documentation

## 2010-07-10 DIAGNOSIS — E669 Obesity, unspecified: Secondary | ICD-10-CM | POA: Insufficient documentation

## 2010-07-10 DIAGNOSIS — I509 Heart failure, unspecified: Secondary | ICD-10-CM | POA: Insufficient documentation

## 2010-07-10 DIAGNOSIS — Z9889 Other specified postprocedural states: Secondary | ICD-10-CM | POA: Insufficient documentation

## 2010-07-10 DIAGNOSIS — I440 Atrioventricular block, first degree: Secondary | ICD-10-CM | POA: Insufficient documentation

## 2010-07-10 DIAGNOSIS — Z8249 Family history of ischemic heart disease and other diseases of the circulatory system: Secondary | ICD-10-CM | POA: Insufficient documentation

## 2010-07-10 DIAGNOSIS — I2789 Other specified pulmonary heart diseases: Secondary | ICD-10-CM | POA: Insufficient documentation

## 2010-07-10 DIAGNOSIS — I428 Other cardiomyopathies: Secondary | ICD-10-CM | POA: Insufficient documentation

## 2010-07-10 DIAGNOSIS — Z95810 Presence of automatic (implantable) cardiac defibrillator: Secondary | ICD-10-CM | POA: Insufficient documentation

## 2010-07-10 DIAGNOSIS — Q211 Atrial septal defect: Secondary | ICD-10-CM | POA: Insufficient documentation

## 2010-07-10 DIAGNOSIS — Z5189 Encounter for other specified aftercare: Secondary | ICD-10-CM | POA: Insufficient documentation

## 2010-07-12 ENCOUNTER — Encounter (HOSPITAL_COMMUNITY): Payer: No Typology Code available for payment source

## 2010-07-12 ENCOUNTER — Ambulatory Visit: Payer: No Typology Code available for payment source | Admitting: Dietician

## 2010-07-12 NOTE — Assessment & Plan Note (Addendum)
Summary: per dr Tenny Craw called on 06/22/10=mj   Visit Type:  Follow-up Primary Provider:  Simone Curia, MD  CC:  no cardiac complaints.  History of Present Illness: Primary Electrophysiologist:  Dr. Lewayne Bunting  Arthur Armstrong is an 77 yo male with a h/o NICM EF 35%, s/p BiV/AICD, chronic systolic CHF, s/p MV repair and PFO closure in 7/06, nonobs. CAD by cath in 7/06 (pLM 20%, pLAD 40%; mCFX 20%), HTN and AFib. He was recently admitted to [facility] February 11 to 06/22/10.  He was admitted with a fall and suffered a liver laceration as well as a renal contusion and facial laceration.  Cardiology was was asked to purchase a tightness care.  He did develop acute on chronic systolic heart failure and was diuresed.  An echocardiogram done during his hospitalization demonstrated an EF of 30-35%; moderate LVH; inferior hypokinesis; mild to moderate LAE and mild RAE; PASP 35.  Labs: Na 141, K 3.4, Creat 1.05 (06/22/10); Hgb 10.8 (06/21/10).  TSH was normal.  He returns for follow up.  He is doing well.  He denies significant shortness of breath.  He denies orthopnea, PND or edema.  He denies syncope.  He reports NYHA class II symptoms.  He denies chest pain.  He was placed on potassium in the hospital.  He did not realize that he was already on this at home and he's been taking 30 mEq total since discharge from the hospital.  He denies abdominal pain.  Current Medications (verified): 1)  Potassium Chloride Cr 10 Meq Cr-Caps (Potassium Chloride) .... Take One Tablet By Mouth Daily 2)  Coreg 6.25 Mg Tabs (Carvedilol) .Marland Kitchen.. 1 By Mouth Two Times A Day 3)  Lasix 40 Mg Tabs (Furosemide) .... Take 1 Tablet By Mouth Every Morning and 1 Tablet By Mouth At Lunch 4)  Aspirin Ec 325 Mg Tbec (Aspirin) .... Take One Tablet By Mouth Daily 5)  Niaspan 500 Mg Cr-Tabs (Niacin (Antihyperlipidemic)) .Marland Kitchen.. 1 By Mouth Once Daily 6)  Flomax 0.4 Mg Xr24h-Cap (Tamsulosin Hcl) .Marland Kitchen.. 1 By Mouth At Bedtime 7)  Ramipril 2.5  Mg Caps (Ramipril) .... Take 1 Capsule By Mouth Two Times A Day 8)  Potassium Chloride 20 Meq Pack (Potassium Chloride) .... Take One Daily  Allergies (verified): No Known Drug Allergies  Past History:  Past Medical History: BiV pacer/AUTOMATIC IMPLANTABLE CARDIAC DEFIBRILLATOR SITU (ICD-V45.02) CARDIOMYOPATHY, SECONDARY (ICD-425.9)   a. NICM EF 30-35% Nonobs CAD - cath 2006 Echo 2/12: EF 30-35%; mod LVH; inf HK; mild-mod LAE and mild RAE; PASP 35 CHF (ICD-428.0) HYPERTENSION (ICD-401.9) history of atrial fibrillation  Review of Systems       As per  the HPI.  All other systems reviewed and negative.   Vital Signs:  Patient profile:   77 year old male Height:      66.5 inches Weight:      161 pounds Pulse rate:   72 / minute Pulse rhythm:   irregular BP sitting:   90 / 70  (right arm)  Vitals Entered By: Jacquelin Hawking, CMA (July 02, 2010 2:53 PM)  Physical Exam  General:  Well nourished, well developed, in no acute distress HEENT: normal Neck: no JVD Cardiac:  normal S1, S2; irreg rhythm Lungs:  crackles at bases bilat that clear with cough; no wheezes Abd: soft, nontender, no hepatomegaly Ext: no edema Vascular: no carotid  bruits Skin: warm and dry Neuro:  CNs 2-12 intact, no focal abnormalities noted     ICD Specifications  Following MD:  Lewayne Bunting, MD     ICD Vendor:  [employer]     ICD Model Number:  [id number]     ICD Serial Number:  [id number] ICD DOI:  01/29/2010     ICD Implanting MD:  Lewayne Bunting, MD  Lead 1:    Location: RA     DOI: 12/05/2004     Model #: [id number]     Serial #: [id number]     Status: active Lead 2:    Location: RV     DOI: 12/05/2004     Model #: 7001     Serial #: [id number]     Status: active Lead 3:    Location: LV     DOI: 12/05/2004     Model #: 1056     Serial #: [id number]     Status: active  Indications::  NICM, CHF  Explantation Comments: 01/29/10 [employer] Atlas [id number]/300792 explanted  ICD Follow Up ICD Dependent:  No       ICD  Device Measurements Configuration: BIPOLAR  Episodes Coumadin:  No  Brady Parameters Mode DDD     Lower Rate Limit:  60     Upper Rate Limit 120 PAV 170     Sensed AV Delay:  150  Tachy Zones VF:  240     VT:  200     VT1:  160     Impression & Recommendations:  Problem # 1:  CHF (ICD-428.0) His volume status appears stable.  He will continue his current diuretic dose.  He is inadvertently taking extra potassium.  We will get a basic metabolic panel today.  If his potassium is stable at this dose it will be continued.  He knows to continue to weigh himself and call us if he develops increased weight or increased shortness of breath.  Otherwise, he can follow up with Dr. Ladona Ridgel in the next 4-6 weeks.  Problem # 2:  OTHER FALL (ICD-E888.8) He suffered a liver lac and renal contusion.  I believe he does not have follow up with the trauma service.  He was contacted by them recently and I encouraged him to return the call.  His balance seems to be off somewhat.  I have encouraged him to discuss with his PCP.  He may benefit from PT.  Problem # 3:  CARDIOMYOPATHY, SECONDARY (ICD-425.9) He is taking his beta blocker and ACE about 4 hours apart.  I have asked him to space them out about 12 hours.  He can take Lasix every 6 hours to alleviate nightime urination.  Problem # 4:  HYPERTENSION (ICD-401.9)  BP may be a little low due to increased freq. of his meds.  See above.  Orders: TLB-BMP (Basic Metabolic Panel-BMET) ([id number]-METABOL)  Patient Instructions: 1)  Your physician recommends that you schedule a follow-up appointment in: MAKE APPOINTMENT TO SEE DR. Ladona Ridgel IN APRIL 2012.... 2)  Your physician recommends that you weigh, daily, at the same time every day, and in the same amount of clothing.  Please record your daily weights on the handout provided and bring it to your next appointment. CALL OFFICE IF 3 LB'S IN 1 DAY AND/OR SOB AND/OR EDEMA. 3)  Your physician recommends that you return  for lab work in: TODAY BMET 428.22, 425.4

## 2010-07-16 ENCOUNTER — Inpatient Hospital Stay (HOSPITAL_COMMUNITY): Payer: No Typology Code available for payment source

## 2010-07-16 ENCOUNTER — Inpatient Hospital Stay (HOSPITAL_COMMUNITY)
Admission: EM | Admit: 2010-07-16 | Discharge: 2010-07-23 | DRG: 482 | Disposition: A | Discharge status: Skilled Nursing Facility | Payer: No Typology Code available for payment source | Attending: Orthopedic Surgery | Admitting: Orthopedic Surgery

## 2010-07-16 ENCOUNTER — Emergency Department (HOSPITAL_COMMUNITY): Payer: No Typology Code available for payment source

## 2010-07-16 ENCOUNTER — Encounter (HOSPITAL_COMMUNITY): Payer: No Typology Code available for payment source

## 2010-07-16 DIAGNOSIS — J449 Chronic obstructive pulmonary disease, unspecified: Secondary | ICD-10-CM | POA: Diagnosis present

## 2010-07-16 DIAGNOSIS — J4489 Other specified chronic obstructive pulmonary disease: Secondary | ICD-10-CM | POA: Diagnosis present

## 2010-07-16 DIAGNOSIS — Y9229 Other specified public building as the place of occurrence of the external cause: Secondary | ICD-10-CM

## 2010-07-16 DIAGNOSIS — I959 Hypotension, unspecified: Secondary | ICD-10-CM | POA: Diagnosis present

## 2010-07-16 DIAGNOSIS — S72143A Displaced intertrochanteric fracture of unspecified femur, initial encounter for closed fracture: Principal | ICD-10-CM | POA: Diagnosis present

## 2010-07-16 DIAGNOSIS — Z7982 Long term (current) use of aspirin: Secondary | ICD-10-CM

## 2010-07-16 DIAGNOSIS — R296 Repeated falls: Secondary | ICD-10-CM | POA: Diagnosis present

## 2010-07-16 DIAGNOSIS — I10 Essential (primary) hypertension: Secondary | ICD-10-CM | POA: Diagnosis present

## 2010-07-16 DIAGNOSIS — Y998 Other external cause status: Secondary | ICD-10-CM

## 2010-07-16 LAB — DIFFERENTIAL
Eosinophils Absolute: 0.2 10*3/uL (ref 0.0–0.7)
Eosinophils Relative: 4 % (ref 0–5)
Lymphocytes Relative: 19 % (ref 12–46)
Lymphs Abs: 1.2 10*3/uL (ref 0.7–4.0)
Monocytes Absolute: 0.3 10*3/uL (ref 0.1–1.0)

## 2010-07-16 LAB — CBC
HCT: 38.7 % — ABNORMAL LOW (ref 39.0–52.0)
MCH: 29.6 pg (ref 26.0–34.0)
MCHC: 32.8 g/dL (ref 30.0–36.0)
MCV: 90.2 fL (ref 78.0–100.0)
Platelets: 96 10*3/uL — ABNORMAL LOW (ref 150–400)
RDW: 16.4 % — ABNORMAL HIGH (ref 11.5–15.5)

## 2010-07-16 LAB — COMPREHENSIVE METABOLIC PANEL
AST: 25 U/L (ref 0–37)
Albumin: 3.6 g/dL (ref 3.5–5.2)
Alkaline Phosphatase: 95 U/L (ref 39–117)
BUN: 23 mg/dL (ref 6–23)
GFR calc Af Amer: >60 mL/min (ref >60)
Potassium: 4.4 mEq/L (ref 3.5–5.1)
Sodium: 140 mEq/L (ref 135–145)
Total Protein: 7 g/dL (ref 6.0–8.3)

## 2010-07-16 LAB — URINALYSIS, ROUTINE W REFLEX MICROSCOPIC
Ketones, ur: NEGATIVE mg/dL
Nitrite: NEGATIVE
Specific Gravity, Urine: 1.012 (ref 1.005–1.030)
pH: 7 (ref 5.0–8.0)

## 2010-07-16 LAB — APTT: aPTT: 31 seconds (ref 24–37)

## 2010-07-17 ENCOUNTER — Encounter (HOSPITAL_COMMUNITY): Payer: No Typology Code available for payment source

## 2010-07-17 DIAGNOSIS — I959 Hypotension, unspecified: Secondary | ICD-10-CM

## 2010-07-17 LAB — GLUCOSE, CAPILLARY: Glucose-Capillary: 171 mg/dL — ABNORMAL HIGH (ref 70–99)

## 2010-07-17 LAB — CBC
HCT: 34.6 % — ABNORMAL LOW (ref 39.0–52.0)
Hemoglobin: 11.3 g/dL — ABNORMAL LOW (ref 13.0–17.0)
MCHC: 32.7 g/dL (ref 30.0–36.0)

## 2010-07-17 LAB — BASIC METABOLIC PANEL
BUN: 17 mg/dL (ref 6–23)
Chloride: 102 mEq/L (ref 96–112)
GFR calc Af Amer: >60 mL/min (ref >60)
Potassium: 3.8 mEq/L (ref 3.5–5.1)

## 2010-07-17 LAB — PROTIME-INR: INR: 1.29 (ref 0.00–1.49)

## 2010-07-17 LAB — MRSA PCR SCREENING: MRSA by PCR: NEGATIVE

## 2010-07-17 LAB — URINE CULTURE

## 2010-07-18 LAB — COMPREHENSIVE METABOLIC PANEL
Albumin: 2.7 g/dL — ABNORMAL LOW (ref 3.5–5.2)
Alkaline Phosphatase: 76 U/L (ref 39–117)
BUN: 14 mg/dL (ref 6–23)
Chloride: 105 mEq/L (ref 96–112)
Glucose, Bld: 116 mg/dL — ABNORMAL HIGH (ref 70–99)
Potassium: 4 mEq/L (ref 3.5–5.1)
Total Bilirubin: 1 mg/dL (ref 0.3–1.2)

## 2010-07-18 LAB — CBC
HCT: 31 % — ABNORMAL LOW (ref 39.0–52.0)
Hemoglobin: 9.9 g/dL — ABNORMAL LOW (ref 13.0–17.0)
MCH: 28.5 pg (ref 26.0–34.0)
MCHC: 31.9 g/dL (ref 30.0–36.0)
MCV: 89.3 fL (ref 78.0–100.0)

## 2010-07-18 LAB — BRAIN NATRIURETIC PEPTIDE: Pro B Natriuretic peptide (BNP): 257 pg/mL — ABNORMAL HIGH (ref 0.0–100.0)

## 2010-07-19 ENCOUNTER — Encounter (HOSPITAL_COMMUNITY): Payer: No Typology Code available for payment source

## 2010-07-19 LAB — BASIC METABOLIC PANEL
BUN: 14 mg/dL (ref 6–23)
CO2: 27 mEq/L (ref 19–32)
GFR calc non Af Amer: >60 mL/min (ref >60)
Glucose, Bld: 115 mg/dL — ABNORMAL HIGH (ref 70–99)
Potassium: 3.7 mEq/L (ref 3.5–5.1)

## 2010-07-19 LAB — PROTIME-INR: Prothrombin Time: 16.1 seconds — ABNORMAL HIGH (ref 11.6–15.2)

## 2010-07-20 DIAGNOSIS — S72143A Displaced intertrochanteric fracture of unspecified femur, initial encounter for closed fracture: Secondary | ICD-10-CM

## 2010-07-20 LAB — PROTIME-INR: INR: 1.26 (ref 0.00–1.49)

## 2010-07-21 LAB — PROTIME-INR: INR: 1.22 (ref 0.00–1.49)

## 2010-07-21 NOTE — Op Note (Signed)
NAMEDENNIE, MOLTZ                ACCOUNT NO.:  [id number]  MEDICAL RECORD NO.:  [id number]           PATIENT TYPE:  I  LOCATION:  3305                         FACILITY:  [facility]  PHYSICIAN:  Almedia Balls. Ranell Patrick, M.D. DATE OF BIRTH:  01-Nov-1926  DATE OF PROCEDURE:  07/16/2010 DATE OF DISCHARGE:                              OPERATIVE REPORT   PREOPERATIVE DIAGNOSIS:  Right displaced intertrochanteric hip fracture.  POSTOPERATIVE DIAGNOSIS:  Right displaced intertrochanteric hip fracture.  PROCEDURE PERFORMED:  Closed intramedullary nailing of right displaced intratrochanteric hip fracture.  SURGEON:  Almedia Balls. Ranell Patrick, MD  ASSISTANT:  Donnie Coffin. Dixon, PA-C  ANESTHESIA:  General anesthesia was used.  ESTIMATED BLOOD LOSS:  250 mL.  FLUID REPLACEMENT:  1200 mL of crystalloid.  URINE OUTPUT:  200 mL.  INSTRUMENT COUNTS:  Correct.  COMPLICATIONS:  There were no complications.  Perioperative antibiotics were given.  INDICATIONS:  The patient is an 77 year old male who suffered a ground- level fall today injuring his right hip.  The patient presented with shortened externally rotated right lower extremity and unable to ambulate.  X-rays demonstrating displaced intertrochanteric fracture. The patient has a history of congestive heart failure and cardiomyopathy requiring AICD.  The patient was hospitalized a month ago for congestive heart failure exacerbation as well as a fall.  The patient does live along in the split-level house and presents now for operative fixation and stabilization of his fracture.  The patient understands and agrees to surgery.  His family is aware of the situation well and consent to the surgery.  Informed consent was obtained.  DESCRIPTION OF PROCEDURE:  After adequate level of anesthesia was achieved, the patient was positioned in the fracture table.  Peroneal post was utilized.  All neurovascular structures were padded appropriately.  Left leg was  placed in modified lithotomy position. Pulse was checked.  Right leg was placed in a traction boot.  We used internal rotation and distal traction to reduce the fracture anatomically on multiple views using the fluoroscope.  We then went ahead and sterilely prepped and draped the right hip and lateral leg. We used a shower drape sterilely over the right hip.  We then brought the C-arm in for visualization.  I made a longitudinal skin incision after a proper time-out was called.  Proximal to the greater trochanter, dissection was down through subcutaneous tissues using Bovie.  Tensor fascia lata was split in line with skin incision.  Starting point for the trochanteric nail was identified and a guide pin was placed and checked on a multiplanar fluoroscopy.  We then introduced a step-cut drill to drill out the proximal femur 60 mm.  We then went ahead and introduced a 13-mm short trochanteric nail by DePuy across the fracture site to the appropriate depth.  We then placed our guide pin on the jig up into the femur and appropriate position was centered on the neck, a little low on the neck on the AP and centered the lateral.  Once we did that, we drilled for the super lag screw, drilled that to about 5 mm from the subcortical bone, then went  ahead and used a 115-mm lag screw into the good subchondral bone of the femoral neck and head.  Once we were at the appropriate depth on the C-arm and verified that the screw was placed and it was accurate, we went ahead and compressed the fracture site with the compression device and on the lag screw and then placed our set screw all the way down and then backed it off one-quarter turn.  This allowed for sliding to control rotation.  At this point, we then removed our drill sleeves for the lag screw and then placed our drill sleeve for the statically locked distal screw and introduced a 4.5 screw of appropriate length through the femur and through the  nail. With hardware in proper position, final x-ray was obtained.  We thoroughly irrigated all wounds and closed in layered closure with 0 Vicryl followed by 2-0 Vicryl and 4-0 Monocryl with staples for skin. Steri-Strips were applied followed by sterile dressing.  The patient tolerated the surgery well.     Almedia Balls. Ranell Patrick, M.D.     SRN/MEDQ  D:  07/16/2010  T:  07/17/2010  Job:  [id number]  Electronically Signed by Malon Kindle  on 07/21/2010 11:25:35 AM

## 2010-07-21 NOTE — Discharge Summary (Signed)
  [name], [name]                ACCOUNT NO.:  [id number]  MEDICAL RECORD NO.:  [id number]           PATIENT TYPE:  I  LOCATION:  3305                         FACILITY:  [facility]  PHYSICIAN:  Almedia Balls. Ranell Patrick, M.D. DATE OF BIRTH:  1926/10/27  DATE OF ADMISSION:  07/16/2010 DATE OF DISCHARGE:  07/20/2010                              DISCHARGE SUMMARY   ADMISSION DIAGNOSIS:  Right femur fracture.  DISCHARGE DIAGNOSES: 1. Right femur fracture status post intramedullary nail. 2. Hypotension.  CONSULTATIONS: 1. Dr. Ericka Pontiff. 2. Vesta Mixer, MD  PROCEDURE:  The patient had a right femur intramedullary nail by Dr. Malon Kindle on July 16, 2010. Assistant was [employer], PA-C.  General anesthesia was used.  No complications.  HOSPITAL COURSE:  The patient was admitted on July 16, 2010, after sustaining a ground-level fall injuring the right hip.  The patient had a quick clearing consultation by Dr. Ericka Pontiff for his heart before surgery.  His pacemaker was turned off.  We did proceed with surgery which he tolerated well.  After adequate time in post anesthesia care unit, he was transferred to step-down unit.  On postop day 1, the patient complained but only minimal pain in the right hip, was actually doing quite well but upon trying to ambulate and get up, the patient did have multiple episodes of hypotension and thus we did consult Dr. Wylene Simmer, his medical doctor, who recommended Cardiology, so we did.  The patient has seen Dr. Lewayne Bunting in the past and Dr. Kristeen Miss was nice enough to consult on the patient who recommended fluid boluses and hold off on his pressors.  The patient states that his normal blood pressure resting was around 100-105, and he was asymptomatic throughout his runs of about 80 systolic of his blood pressure.  His wound was healing well.  He continued to improve throughout his hospital stay but it was recommended for skilled nursing short  term versus physical therapy, and the patient was in agreement.  Thus, the patient is to be discharged to skilled nursing facility once a bed is available and his hypotension normalizes.  His condition is guarded.  His diet is regular.  FOLLOWUP:  The patient will follow back up with Dr. Malon Kindle in 2 weeks.  The patient has an allergy to COUMADIN.  DISCHARGE MEDICATIONS: 1. Norco 5/325 one to two tabs q.4-6 h. p.r.n. pain. 2. Lovenox 40 mg subcu daily, 10 days. 3. Robaxin 500 mg p.o. q.6 h.     [name], P.A.   ______________________________ Almedia Balls. Ranell Patrick, M.D.    TBD/MEDQ  D:  07/19/2010  T:  07/20/2010  Job:  [id number]  Electronically Signed by Standley Dakins P.A. on 07/20/2010 02:40:27 PM Electronically Signed by Malon Kindle  on 07/21/2010 11:25:38 AM

## 2010-07-22 LAB — PROTIME-INR
INR: 1.16 (ref 0.00–1.49)
Prothrombin Time: 15 seconds (ref 11.6–15.2)

## 2010-07-23 ENCOUNTER — Encounter (HOSPITAL_COMMUNITY): Payer: No Typology Code available for payment source

## 2010-07-24 ENCOUNTER — Encounter (HOSPITAL_COMMUNITY): Payer: No Typology Code available for payment source

## 2010-07-26 ENCOUNTER — Encounter (HOSPITAL_COMMUNITY): Payer: No Typology Code available for payment source

## 2010-07-30 ENCOUNTER — Encounter (HOSPITAL_COMMUNITY): Payer: No Typology Code available for payment source

## 2010-07-31 ENCOUNTER — Encounter (HOSPITAL_COMMUNITY): Payer: No Typology Code available for payment source

## 2010-08-02 ENCOUNTER — Encounter (HOSPITAL_COMMUNITY): Payer: No Typology Code available for payment source
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
## 2010-08-06 ENCOUNTER — Encounter (HOSPITAL_COMMUNITY): Payer: Self-pay

## 2010-08-07 ENCOUNTER — Encounter (HOSPITAL_COMMUNITY): Payer: Self-pay

## 2010-08-09 ENCOUNTER — Encounter (HOSPITAL_COMMUNITY): Payer: Self-pay

## 2010-08-13 ENCOUNTER — Encounter (HOSPITAL_COMMUNITY): Payer: Self-pay

## 2010-08-14 ENCOUNTER — Encounter (HOSPITAL_COMMUNITY): Payer: Self-pay

## 2010-08-14 NOTE — Consult Note (Signed)
NAMECANTON, YEARBY                ACCOUNT NO.:  [id number]  MEDICAL RECORD NO.:  [id number]           PATIENT TYPE:  I  LOCATION:  3305                         FACILITY:  [facility]  PHYSICIAN:  Vesta Mixer, M.D. DATE OF BIRTH:  08-09-1926  DATE OF CONSULTATION:  07/17/2010 DATE OF DISCHARGE:                                CONSULTATION   Arthur Armstrong is an 77 year old gentleman with a history of nonischemic cardiomyopathy.  He recently fell and broke his hip.  We are asked to see him today following some episodes of hypotension after his hip surgery.  Mr. Groh has a history of congestive heart failure and mitral regurgitation diagnosed in 2006.  He subsequently had mitral valve for replacement by Dr. Cornelius Moras.  He also has had an biventricular AICD placed and has been managed by Dr. Ladona Ridgel.  He has overall done fairly well. His ejection fraction has been stable at 30-35% range.  The patient has done fairly well, but has fallen a couple of times recently.  He fell yesterday and suffered a right hip fracture.  He was admitted to the hospital and had his right hip fixed.  He denies any chest pain or shortness breath.  He denies any syncope or presyncope prior to the episode.  He apparently just fell and lost his balance.  Current medications at home include: 1. Hydrocodone as needed. 2. Carvedilol 6.25 mg twice a day. 3. Flomax 0.4 mg a day. 4. Niacin 500 mg a day. 5. Lasix 40 mg twice a day. 6. Aspirin 325 mg a day. 7. Altace 2.5 mg twice a day.  ALLERGIES:  He has no known medication allergies.  PAST MEDICAL HISTORY: 1. History of nonischemic dilated cardiomyopathy with an ejection     fraction of around 30-35%. 2. History of hypertension. 3. History of mitral regurgitation - status post mitral valve     replacement.  SOCIAL HISTORY:  The patient has a history of smoking, but quit many years ago.  FAMILY HISTORY:  Negative for premature coronary artery  disease.  REVIEW OF SYSTEMS:  As noted in the HPI.  He denies any angina.  He denies any syncope or presyncope.  He denies any shortness of breath tonight.  PHYSICAL EXAMINATION:  GENERAL:  He is an elderly gentleman, in no acute distress.  He is very comfortable lying in bed. VITAL SIGNS:  His blood pressure is 102/55, his heart rate is 65. HEENT:  Carotids 2+.  There is no JVD.  His mucous membranes are moist. NECK:  Supple. LUNGS:  Clear posteriorly and anteriorly.  His PMI is nondisplaced. HEART:  Regular rate S1 and S2.  I did not hear any systolic murmur. His chest wall is nontender. ABDOMEN:  Good bowel sounds.  There is no hepatosplenomegaly.  There is no guarding or rebound. EXTREMITIES:  No palpable cords.  There is absolutely no edema.  There is no clubbing or cyanosis.  LABORATORY DATA:  His white blood cell count is 7.6.  His hemoglobin is 11.3, hematocrit is 34.6.  His sodium is 137, potassium is 3.8, chloride is 102, CO2 is 29,  BUN 17, creatinine is 1.0.  His glucose is 143.  His INR is 1.29.  His EKG reveals ventricular pacing.  His chest x-ray reveals no congestive heart failure.  IMPRESSION AND PLAN:  Hypertension.  The patient had some hypertension earlier today, but this has resolved completely after receiving 500 mL of normal saline.  I suspect that this hypertension was due to the fact that he has missed several meals over the past 2 days.  I think that this will improve and in fact should resolve after he catches up with his eating.  We will continue with the IV fluids for the next 6 hours and at that point, we will hep lock the IV fluids.  We can certainly give further boluses of normal saline as needed.  We will go ahead and feed him dinner.  At this point, there is absolutely no indication for pressors.  His blood pressure has gradually increased.  I do agree with holding his blood pressure medicines tonight and in fact probably for tomorrow morning.   We will anticipate restarting them on Thursday if his blood pressure allows.  At this point, there are no signs or symptoms of ischemia and so I do not think that cardiac enzymes need to be drawn. He had an echocardiogram performed during his last admission in February, and I do not think that he needs a repeat echocardiogram.  We will follow along for the next several days and he should be able to be fairly stable at that point.     Vesta Mixer, M.D.     PJN/MEDQ  D:  07/17/2010  T:  07/18/2010  Job:  [id number]  cc:   Almedia Balls. Ranell Patrick, M.D. Doylene Canning. Ladona Ridgel, MD  Electronically Signed by Kristeen Miss M.D. on 08/14/2010 09:07:48 AM

## 2010-08-16 ENCOUNTER — Encounter (HOSPITAL_COMMUNITY): Payer: Self-pay

## 2010-08-20 ENCOUNTER — Encounter (HOSPITAL_COMMUNITY): Payer: Self-pay

## 2010-08-21 ENCOUNTER — Encounter (HOSPITAL_COMMUNITY): Payer: Self-pay

## 2010-08-23 ENCOUNTER — Encounter (HOSPITAL_COMMUNITY): Payer: Self-pay

## 2010-08-27 ENCOUNTER — Encounter: Payer: Self-pay | Admitting: Internal Medicine

## 2010-08-27 ENCOUNTER — Encounter (HOSPITAL_COMMUNITY): Payer: Self-pay

## 2010-08-28 ENCOUNTER — Encounter: Payer: Self-pay | Admitting: *Deleted

## 2010-08-28 ENCOUNTER — Encounter: Payer: No Typology Code available for payment source | Admitting: Internal Medicine

## 2010-08-28 ENCOUNTER — Encounter (HOSPITAL_COMMUNITY): Payer: Self-pay

## 2010-08-30 ENCOUNTER — Encounter (HOSPITAL_COMMUNITY): Payer: Self-pay

## 2010-09-03 ENCOUNTER — Encounter (HOSPITAL_COMMUNITY): Payer: Self-pay

## 2010-09-04 ENCOUNTER — Encounter (HOSPITAL_COMMUNITY): Payer: Self-pay

## 2010-09-06 ENCOUNTER — Encounter (HOSPITAL_COMMUNITY): Payer: Self-pay

## 2010-09-10 ENCOUNTER — Encounter (HOSPITAL_COMMUNITY): Payer: Self-pay

## 2010-09-11 ENCOUNTER — Encounter (HOSPITAL_COMMUNITY): Payer: Self-pay

## 2010-09-13 ENCOUNTER — Encounter (HOSPITAL_COMMUNITY): Payer: Self-pay

## 2010-09-17 ENCOUNTER — Encounter (HOSPITAL_COMMUNITY): Payer: Self-pay

## 2010-09-18 ENCOUNTER — Encounter (HOSPITAL_COMMUNITY): Payer: Self-pay

## 2010-09-18 NOTE — Assessment & Plan Note (Signed)
[facility]                         ELECTROPHYSIOLOGY OFFICE NOTE   NAME:LEWISMilik, Gilreath                       MRN:          [id number]  DATE:02/23/2008                            DOB:          12-04-1926    Mr. Colman returns today for followup.  He is a very pleasant 77-year-  old man with a history of nonischemic cardiomyopathy and congestive  heart failure, which has been very nicely compensated with the  implantation of a biventricular defibrillator.  He returns today for  followup.  He denies chest pain.  He denies shortness of breath.  His  activity is that he wants to be.  He had no specific complaints.   MEDICATIONS:  1. Altace 1.25 mg twice daily.  2. Klor-Con 20 mEq half tablet daily.  3. Furosemide 40 mg half tablet daily.  4. Coreg 6.25 twice daily.  5. Aspirin 325 a day.   PHYSICAL EXAMINATION:  GENERAL:  He is a pleasant well-appearing elderly  man in no acute distress.  He looks somewhat younger than his stated  age.  VITAL SIGNS:  His blood pressure was 160/90, the pulse was 60 and  regular, the respirations were 18, weight was 189 pounds.  NECK:  No jugular venous distention.  LUNGS:  Clear bilaterally to auscultation.  No wheezes, rales, or  rhonchi are present.  CARDIOVASCULAR:  Regular rate and rhythm.  Normal S1 and S2.  EXTREMITIES:  No edema.   Interrogation of his defibrillator demonstrates a Social research officer, government [phone].  P-waves were 2, the R-waves were greater than 12, the impedance 450 in  the A, 550 in the RV, 740 in the LV.  The threshold 1 at 0.5 in the  atrium, 0.5 at 0.5 in the RV, 1.5 at 0.7 in the LV.  Battery voltage was  2.6 volts.   IMPRESSION:  1. Nonischemic cardiomyopathy.  2. Congestive heart failure.  3. Hypertension.   DISCUSSION:  Mr. Corby is stable today.  He states that he is without a  primary care physician and I will refer him to Dr. Patsy Lager in our  practice for additional followup.     Doylene Canning. Ladona Ridgel, MD  Electronically Signed    GWT/MedQ  DD: 02/23/2008  DT: 02/24/2008  Job #: [id number]

## 2010-09-18 NOTE — Assessment & Plan Note (Signed)
[facility]                         ELECTROPHYSIOLOGY OFFICE NOTE   NAME:Arthur Armstrong, Arthur Armstrong                       MRN:          [id number]  DATE:12/09/2006                            DOB:          Sep 23, 1926    [name] returns today for follow up.  He is a very pleasant man with  non-ischemic cardiomyopathy, severe left ventricular dysfunction, status  post BiV ICD insertion.  He returns today for follow up.  He had no  specific complaints today.  Overall, he has felt quite well.   MEDICATIONS:  1. Altace 1.25 twice daily.  2. Warfarin 5 daily.  3. Potassium.  4. Furosemide 40 mg half a tablet daily.  5. Coreg 6.25 twice daily.   PHYSICAL EXAMINATION:  GENERAL:  A pleasant man in no distress.  VITAL SIGNS:  Blood pressure 128/78, pulse 64 and regular, respirations  18, weight 188 pounds.  NECK:  No jugular venous distention.  LUNGS:  Clear bilaterally to auscultation.  No wheezing, rales, or  rhonchi.  CARDIOVASCULAR:  Regular rate and rhythm with normal S1 and S2.  EXTREMITIES:  No edema.   Interrogation of his defibrillator demonstrates a Social research officer, government [phone].  The P and R waves were 1.5 and 12 respectively.  The impedance 530 in  the atrium, 590 in the right ventricle, 680 in the left ventricle.  The  threshold 1 at 0.5 in the atrium, 0.5 at 0.5 at the right ventricle, and  2 at 0.5 in the left ventricle.  The battery voltage is 3.0 volts.  Underlying rhythm is sinus bradycardia.   IMPRESSION:  1. Congestive heart failure (chronic systolic).  2. Nonischemic cardiomyopathy.  3. Status post BiV ICD insertion.   DISCUSSION:  Overall, Arthur Armstrong is stable and his defibrillator is  working normally.  Will continue his present medical therapy.  I have  asked that he maintain a low sodium diet and will see him back in the  office in a year.     Doylene Canning. Ladona Ridgel, MD  Electronically Signed    GWT/MedQ  DD: 12/09/2006  DT: 12/10/2006  Job #:  [id number]

## 2010-09-20 ENCOUNTER — Encounter (HOSPITAL_COMMUNITY): Payer: Self-pay

## 2010-09-21 NOTE — Op Note (Signed)
NAME:  Arthur Armstrong, Arthur Armstrong NO.:  [id number]   MEDICAL RECORD NO.:  [id number]          PATIENT TYPE:  INP   LOCATION:  2041                         FACILITY:  [facility]   PHYSICIAN:  Doylene Canning. Ladona Ridgel, M.D.  DATE OF BIRTH:  [birth year]   DATE OF PROCEDURE:  12/05/2004  DATE OF DISCHARGE:                                 OPERATIVE REPORT   PROCEDURE:  Implantation of a dual chamber biventricular ICD.   INDICATIONS FOR PROCEDURE:  Nonischemic cardiomyopathy class 3 heart failure  status post mitral valve repair with complete heart block following the  procedure necessitating pacing in the setting of EF of 30%.   INTRODUCTION:  The patient is a 77 year old man with a history of congestive  heart failure, dilated cardiomyopathy and ejection fraction of 30%. He had  severe mitral regurgitation and underwent mitral valve repair. The patient  postoperatively developed worsening conduction system disease which went  from first degree heart block to second degree heart block to complete heart  block. This persisted. He is now referred for biventricular ICD implantation  secondary to his underlying nonischemic cardiomyopathy, history of  nonsustained VT, and class 3 heart failure with underlying heart block.   DESCRIPTION OF PROCEDURE:  After informed consent was obtained, the patient  was taken to the diagnostic EP lab in the fasted state. After the usual  preparation and draping, intravenous stent, midazolam was given for  sedation. 30 mL of lidocaine was infiltrated into the left infraclavicular  region. A 7 cm incision was carried out over this region, electrocautery  utilized to dissect down to the fascial plane. 10 mL of  contrast was  injected into the left upper extremity venous system demonstrating a patent  left subclavian vein. It was subsequently punctured x3 and the St. Jude  model 7001, 65 cm active fixation defibrillation lead, serial #[id number] is  advanced through the  right ventricle, the St. Jude model 1488T, 53 cm active  fixation pacing lead, serial #[id number] was advanced to the right atrium.  Mapping was carried out in the right ventricle and at the final site the R  waves measured 9 millivolts and the pacing threshold 0.6 volts at 0.5  milliseconds once the lead is actively fixed. The pacing impedance was 630  ohms. 10 volt pacing did not stimulate the diaphragm. Next, the atrial lead  was mapped into the remnant of the right atrial appendage. The lead was  actively fixed and the patient acutely developed chest pain. The lead was  retracted and repositioned and the patient's chest pain eventually resolved.  There was no hemodynamic sequelae from this. It was felt that there was a  microperforation of the right atrium from the initial placement of the  atrial lead. After replacement of the lead, there was no additional pain.  The P waves measured 1.3 millivolts, the pacing impedance was 440 ohms and  the pacing threshold 0.7 volts at 0.5 milliseconds. Ten volt pacing did not  stimulate the diaphragm with the lead actively fixed. With both right atrial  and right ventricular leads in satisfactory position, attention was then  turned to placement of the left ventricular lead. The guiding catheter was  advanced into the right atrium and coronary sinus was cannulated without  difficulty utilizing the 6 French hexapolar EP coronary sinus catheter.  Venography of the coronary sinus was then carried out. This demonstrated a  nice lateral vein suitable for LV pacing. The St. Jude model 1056, 86 cm  passive fixation LV pacing lead, serial #[id number] was advanced into the  lateral vein of the left ventricle. The R waves measured 9 millivolts, the  pacing impedance was 649 ohms and the pacing threshhold was 1.7 volts at 0.8  milliseconds in the unipolar mode. The bipolar mode had an increasing pacing  threshhold in this location. With these satisfactory  parameters, the leads  were secured to the subpectoralis fascia with a figure-of-eight silk suture.  The sew-in sleeve was secured with silk suture. Electrocautery was utilized  to make a subcutaneous pocket. Kanamycin irrigation was utilized to irrigate  the pocket. Electrocautery was utilized to assure hemostasis. The St. Jude  [phone] biventricular ICD serial [id number] was connected to the right atrial,  left ventricular and ICD leads and placed in the subcutaneous pocket. The  generator was secured with silk sutures. Additional Kanamycin was utilized  to irrigate the pocket and defibrillation threshold testing carried out.   After the patient was more deeply sedated with fentanyl and Versed, VF was  induced with a T wave shock. A 15 joule shock was delivered which terminated  ventricular fibrillation and restored sinus rhythm. Five minutes was allowed  to elapse and a second DFT test carried out. Again VF was induced with a T  wave shock and a 15 joule shock was delivered terminating ventricular  fibrillation and restoring sinus rhythm. At this point, no additional  defibrillation threshold testing was carried out and the incision was closed  with a layer of 2-0 Vicryl followed by a layer of 3-0 Vicryl, followed by a  layer of 4-0 Vicryl. Benzoin was painted on the skin, Steri-Strips were  applied, a pressure was placed and the patient was returned to his room in  satisfactory condition.   COMPLICATIONS:  There were no immediate procedure complications.   RESULTS:  This demonstrates successful implantation of a St. Jude  biventricular ICD in a patient with nonischemic cardiomyopathy, ejection  fraction of 30%, wide QRS (paced at 200 milliseconds in the setting of  underlying complete heart block status post mitral valve repair).       GWT/MEDQ  D:  12/05/2004  T:  12/05/2004  Job:  [id number]   cc:   Salvatore Decent. Cornelius Moras, M.D.  [address]  [place] [address]   Vida Roller,  M.D.  Fax: [phone]   Vale Haven. Andrey Campanile, M.D.  [address]  [place]  [place] [address]  Fax: [phone]

## 2010-09-21 NOTE — Op Note (Signed)
Arthur Armstrong, Arthur Armstrong                ACCOUNT NO.:  [id number]   MEDICAL RECORD NO.:  [id number]          PATIENT TYPE:  INP   LOCATION:  2305                         FACILITY:  [facility]   PHYSICIAN:  Salvatore Decent. Cornelius Moras, M.D. DATE OF BIRTH:  [birth year]   DATE OF PROCEDURE:  11/28/2004  DATE OF DISCHARGE:                                 OPERATIVE REPORT   PREOPERATIVE DIAGNOSES:  Dilated nonischemic cardiomyopathy with severe  mitral regurgitation, patent foramen ovale.   POSTOPERATIVE DIAGNOSES:  Dilated nonischemic cardiomyopathy with severe  mitral regurgitation, patent foramen ovale.  Marland Kitchen   PROCEDURE:  1.  Median sternotomy for mitral valve repair (26 mm Edwards Geoform ring      annuloplasty, commissuroplasty x2).  2.  Closure of patent foramen ovale.  3.  Ligation of left atrial appendage.   SURGEON:  Salvatore Decent. Cornelius Moras, M.D.   ASSISTANT:  Kerin Perna, M.D.   ANESTHESIA:  General.   BRIEF CLINICAL NOTE:  The patient is a 77 year old gentleman from [place]  with no previous history of cardiac disease, who presents with exertional  shortness of breath and symptoms of exertional congestive heart failure. He  was noted to have a murmur on physical exam and echocardiogram confirms the  presence of severe mitral regurgitation. He underwent left and right heart  catheterization, revealing normal coronary artery anatomy with no  significant coronary artery disease. There is dilated left ventricle with  moderate to severe left ventricular dysfunction and pulmonary hypertension.  Transesophageal echocardiogram confirms the presence of moderate to severe  mitral regurgitation. A full consultation note has been dictated previously.   OPERATIVE CONSENT:  The patient and his family have been counseled at length  regarding the indications and potential benefits of mitral valve repair.  They understand the possible need for valve replacement if repair is not  feasible at the time of  surgery. They understand the plan for closure of the  patent foramen ovale discovered at the time of transesophageal  echocardiogram. They specifically understand and accept all associated risks  of surgery, including but not limited to:  risk of death, stroke, myocardial  infarction, congestive heart failure, respiratory failure, pneumonia,  bleeding requiring blood transfusion, arrhythmia, heart block or bradycardia  requiring permanent pacemaker, late recurrence of valvular heart disease,  possible need for placement of the implantable defibrillator. All their  questions have been addressed.   OPERATIVE NOTE IN DETAIL:  The patient was brought to the operating room on  the above-mentioned date and central monitoring was established by the  anesthesia service, under the care and direction of Dr. Lacretia Nicks. Autumn Patty. Specifically, Swan-Ganz catheter was placed through the right  internal jugular approach. A radial arterial line was placed. Intravenous  antibiotics were administered. Following induction with general endotracheal  anesthesia, a Foley catheter was placed. The patient's chest, abdomen, both  groins, and both lower extremities were prepared and draped in sterile  manner.   Baseline transesophageal echocardiogram was performed by Dr. Sampson Goon and  Dr. Krista Blue. This confirms the presence of at least moderate to severe mitral  regurgitation. The  left ventricle was dilated and there is global left  ventricular dysfunction, with ejection fraction estimated less than 30%.  There are no regional wall motion abnormalities. The mitral valve apparatus  appears essentially normal, but there is failure of coaptation of the  leaflets with at least moderate mitral regurgitation. There is some  suggestion of pseudo prolapse of the middle scallop of the anterior leaflet  of the mitral valve, and perhaps some slight prolapse of the leaflet tissue  near the posterior medial commissure. The  posterior leaflet appears to move  well and there is no significant calcification appreciated. Aortic valve  appears normal. No other abnormalities were noted.   A median sternotomy incision was performed. The pericardium was opened. The  patient is heparinized systemically. The ascending aorta was dilated and  mildly sclerotic. There are no palpable plaques or calcifications. The  ascending aorta is cannulated for cardiopulmonary bypass. The venous cannula  was placed directly in the superior vena cava. A second venous cannula was  placed low in the right atrium, with tip extending down the inferior vena  cava. A retrograde cardioplegic catheter is placed through the right atrium  into the coronary sinus.   Cardiopulmonary bypass was begun. Vessel loops were placed around the  superior vena cava and inferior vena cava. Cardioplegic catheter was placed  in the ascending aorta. A temperature probe was placed in the left  ventricular septum. The patient is cooled to 32 degrees systemic  temperature. The aortic crossclamp was applied and cardioplegia is delivered  initially in antegrade fashion through the aortic root. Iced saline slush  was applied for topical hypothermia. Supplemental cardioplegia administered  retrograde through the coronary sinus catheter. The initial cardioplegic  arrest and myocardial cooling are felt to be excellent. Repeat doses of  cardioplegia are administered intermittently throughout the crossclamp  portion of the operation, and retrograde through the coronary sinus catheter  to maintain septal temperature below 15 degrees centigrade.   Left atriotomy incision was performed posteriorly through the interatrial  groove. The left atrium is enlarged and dilated. The mitral valve was  exposed using a self-retaining retractor. Exposure is technically  challenging due to the somewhat deep and barrel-shaped chest and the large size of the heart. However, ultimately  excellent exposure of mitral valve  was achieved. The mitral valve was carefully analyzed for pathology. There  was no significant leaflet pathology, with the exception of an old scallop  of the anterior leaflet, which is somewhat thickened and furled and  corresponds with the portion of the leaflet that appeared to have pseudo  prolapse identified on preoperative echocardiogram. The subvalvular  apparatus to this portion of the anterior leaflet is normal and there is  truly no significant prolapse appreciated. There is some mild prolapse in  the posterior medial commissure. The subvalvular apparatus appears normal.  There is minimal calcification in the annulus. The majority of the patient's  mitral regurgitation was felt to be functional and a consequence of the  annular dilatation with downward and lateral displacement of the subvalvular  apparatus, causing secondary incomplete coaptation of the leaflets of the  valve.   Mitral valve repair is performed using interrupted 2-0 Ethibond horizontal  mattress sutures, placed circumferentially around the entire mitral valve  annulus. The commissuroplasty of  the posterior commissure was also  performed using interrupted 4-0 Ethibond everting simple sutures. There was  a large cleft between the P2 and P3 portion of the posterior leaflet, and  this was  also closed with the commissuroplasty of interrupted 4-0 Ethibond  everting simple sutures. The mitral valve was sized to accept a 26 mm  annuloplasty ring. The anterior leaflet of mitral valve was slightly larger  than this, corresponding probably more closely to a 28 mm annuloplasty ring.  The ring is downsized to completely and adequately treat the functional  mitral regurgitation. A 26 mm Edwards Geoform annuloplasty ring (model  number 4200, serial number [id number]) is secured in place uneventfully. Care  was taken during the ring annuloplasty to take many good bites of the  annular tissue,  particularly in the posterior annulus,  where the Geoform  ring pulls up the posterior annulus considerably. After the ring was secured  in place, the valve was tested for competence by injecting iced saline into  the left ventricular chamber. The valve appears to be perfectly competent  with no residual mitral regurgitation.   Rewarming was begun. The left atrial appendage was oversewn from within the  left atrium, using a two-layer closure of running 3-0 Prolene suture. The  left atriotomy incision was closed using a two-layer closure of running 3-0  Prolene suture.   An oblique right atriotomy incision was performed. The patent foramen ovale  was identified in the usual location within the fossa ovalis. This patent  foramen ovale is actually quite large, measuring 4 x 2 mm at least in  dimension. This is closed using a two-layer closure of running 4-0 Prolene  suture. The right atriotomy incision was closed with a two-layer closure of running 4-0 Prolene suture.   The patient is placed in Trendelenburg position. One final dose of warm  retrograde hot shot cardioplegia is administered. The lungs were ventilated  and the heart allowed to fill, and all air is evacuated through the aortic  root. Aortic crossclamp was removed after total crossclamp time of 135  minutes.   The heart began to beat spontaneously, although ultimately it is  cardioverted x2 due to transient episodes of ventricular fibrillation.  Following cardioversion normal sinus rhythm resumed spontaneously. The  retrograde cardioplegic catheter was removed. An epicardial pacing wire was  affixed to the right ventricular free wall into the right atrial appendage.  The IVC cannula was removed and its cannulation site oversewn with Prolene  suture. The patient rewarmed to 37 degrees centigrade temperature. The  patient was weaned from cardiopulmonary bypass without difficulty. The  patient's rhythm at separation from bypass  is normal sinus rhythm. The  patient is weaned from bypass on low-dose dopamine and milrinone infusions.  Total cardiopulmonary bypass time for the operation is 158 minutes.   Follow-up transesophageal echocardiogram performed by Dr. Sampson Goon, after  separation from bypass, demonstrates no residual mitral regurgitation. There  is a well-seated mitral annuloplasty ring. The valve appears to open and  close normally and is without sign of significant stenosis. Left ventricular  function remains reduced. There is no significant residual air.   The SVC cannula and aortic vent are both removed. The aortic cannula was now  removed. Protamine was administered to reverse the anticoagulation. The  mediastinum was irrigated with saline solution containing vancomycin.  Meticulous surgical hemostasis was ascertained. The mediastinum and the  right pleural space were drained using three chest tubes, exited through  separate stab incisions inferiorly. The pericardium was reapproximated  anteriorly over the aorta. The median sternotomy was closed with double-  strength sternal wire. The soft tissues anterior to the sternum were closed  in multiple layers and the  skin was closed with running subcuticular skin  closure.   The patient tolerated the procedure well and was transported to the surgical  intensive care unit in stable condition. There were no intraoperative  complications. All sponge, instrument and needle counts were verified  correct at completion of the operation. No blood products were administered.       CHO/MEDQ  D:  11/28/2004  T:  11/28/2004  Job:  [id number]   cc:   Vida Roller, M.D.  Fax: [phone]   Pricilla Riffle, M.D.   Stanley C. Andrey Campanile, M.D.  [address]  [place]  [place] [address]  Fax: [phone]

## 2010-09-21 NOTE — Op Note (Signed)
NAME:  Arthur Armstrong, Arthur Armstrong                          ACCOUNT NO.:  [id number]   MEDICAL RECORD NO.:  [id number]                   PATIENT TYPE:  AMB   LOCATION:  DSC                                  FACILITY:  [facility]   PHYSICIAN:  Thera Flake., M.D.             DATE OF BIRTH:  03/07/1927   DATE OF PROCEDURE:  06/11/2002  DATE OF DISCHARGE:                                 OPERATIVE REPORT   PREOPERATIVE DIAGNOSES:  1. Large interstitial rotator cuff tear, left shoulder.  2. Degenerative tearing, anterior superior labrum.  3. Acromioclavicular joint arthritis.  4. Impingement.   POSTOPERATIVE DIAGNOSES:  1. Large interstitial rotator cuff tear, left shoulder.  2. Degenerative tearing, anterior superior labrum.  3. Acromioclavicular joint arthritis.  4. Impingement.   PROCEDURES:  1. Open acromioplasty and rotator cuff repair.  2. Open distal clavicle.  3. Arthroscopic debridement, torn labrum.   SURGEON:  Dyke Brackett, M.D.   ASSISTANT:  Legrand Pitts. Duffy, P.A.   INDICATIONS:  A 77 year old, MRI-proven rotator cuff tear with symptoms  thought to be amenable to overnight hospitalization and repair.   DESCRIPTION OF PROCEDURE:  Arthroscope through a posterior and anterior  portal.  Systematic inspection of the glenohumeral joint showed very mild  degenerative change of the joint, moderate fraying and tearing of the  labrum, and the biceps tendon anchor would not be identified.  The biceps  possibly had ruptured.  Diffuse synovitis, which was debrided.  A rather  large interstitial tear of the cuff, actually some 2-3 cm from the  insertion, involving very significant portion of the supraspinatus.  This  was aggressively debrided.  There was extreme unfavorable bony anatomy  relative to the hypertrophy of the [facility] joint and degenerative change as well  as a prominent acromion.  For that reason and the largeness of the tear, the  patient was next converted to open procedure,  incision made bisecting the  acromion and the [facility] joint.  Dissection carried down about 2-3 cm distal to  the tip of the acromion.  There was a large degenerative AC joint requiring  resection of about 1-1.5 cm of bone from the distal clavicle, impingement  from significant thickening and hypertrophy into the acromion with  thickening of the CA ligament and calcification.  This was resected about  almost a centimeter of bone off the anterior and leading edge of the  acromion, revealing a thickened bursa, which was resected.  The cuff tear  probably came from more impingement on the [facility] joint level as the distal  insertion of the cuff looked relatively good, relatively speaking.  A  sizeable portion of degenerative tendon in the midportion of the  supraspinatus was resected.  Then multiple interrupted #1 Fibrewires were  placed, probably four to five such stitches to close the defect created.  This was some distance from the tuberosity, so no anchor-type  stitches could  be used onto the tuberosity; however, the tendinous tissue was somewhat  fibrillated but it was a very thick, somewhat robust tendon, and it was felt  that this end-to-end repair was a good repair although it was required to do  a tendon-to-tendon repair.  Essentially a watertight repair was created.  Excess bone off the acromion was resected using rongeurs, rasps, and high-  speed bur.  Closure on the deltoid was effected with #1 Fibrewire, the  repair in the cuff with #1 Fibrewire, the subcutaneous tissues with 3-0  Vicryl, skin with Monocryl.  A lightly compressive sterile dressing applied  and a __________ UltraSling.  Taken to the recovery room in stable  condition.                                               Thera Flake., M.D.    WDC/MEDQ  D:  06/11/2002  T:  06/11/2002  Job:  [id number]

## 2010-09-21 NOTE — H&P (Signed)
NAMEKEEN, EWALT NO.:  [id number]   MEDICAL RECORD NO.:  [id number]          PATIENT TYPE:  INP   LOCATION:  1826                         FACILITY:  [facility]   PHYSICIAN:  Vida Roller, M.D.   DATE OF BIRTH:  12-02-1926   DATE OF ADMISSION:  11/12/2004  DATE OF DISCHARGE:                                HISTORY & PHYSICAL   REASON FOR ADMISSION:  Mr. Hartig is a very pleasant 77 year old male, with  no prior cardiac history, who is now referred directly by his primary care  physician to the emergency room for further evaluation of new onset  exertional dyspnea.   The patient presents with an approximate one-week history of dyspnea with  moderate exertion.  However, he denies any associated chest discomfort,  diaphoresis, nausea, or tachypalpitations.  The patient refers to instances  of working in his garden and, for example, had an episode this past Saturday  while picking string beans.  He became significant short of breath.  However, he denies any recent development of orthopnea, paroxysmal nocturnal  dyspnea, or lower extremity edema.   The patient presents with no significant cardiac risk factors except for age  and family history.  He denies any history of hypertension, but presents  with blood pressure reading of 152/115.  He also presents with a recent  chest x-ray here at [facility] in March 2006, which suggested probable  bronchitis.  He states that he recalls having had this done for a complaint  of dyspnea after returning from the beach and that he was treated with a Z-  PAK, but no other medications.   ALLERGIES:  No known drug allergies.   MEDICATIONS PRIOR TO ADMISSION:  Coated aspirin 325 mg daily.   PAST MEDICAL HISTORY:  1.  Bronchitis in March 2006.  2.  Left shoulder surgery, 2004.  3.  Polypectomy, April 2005.  4.  Remote appendectomy.  5.  The patient also reports having had a reportedly negative stress test at      the [place]  office approximately five years ago.   SOCIAL HISTORY:  The patient lives here in [place] with his wife.  They  have six grown children.  He is a retired Industrial/product designer.  He has never smoked tobacco and denies alcohol use.   FAMILY HISTORY:  Mother deceased in her 53s, secondary to myocardial  infarction.  A brother deceased in his 42s, presumably secondary to  myocardial infarction.   REVIEW OF SYSTEMS:  Denies any prior history of myocardial infarction,  congestive heart failure, or stroke.  Denies any known history of thyroid  disease or childhood rheumatic fever.  Denies any prior history of  exertional chest discomfort.  Denies any recent evidence of upper or lower  GI bleeding.  Of note, the patient also reports recent significant fatigue.  Otherwise as noted per HPI.  Remaining systems negative.   PHYSICAL EXAMINATION:  VITAL SIGNS:  Blood pressure 152/115 on admission;  pulse 62; respirations 20; temperature 97.4; SAO2 93% on room air.  GENERAL:  A 77 year old male in no  apparent distress.  HEENT:  Normocephalic, atraumatic.  NECK:  Preserved bilateral carotid pulses, with no bruits; jugular venous  distention noted at 60 degrees.  LUNGS:  Faint late basilar crackles; no wheezes.  HEART:  Regularly irregular (S1, S2), with soft grade 3/6 holosystolic  murmur extending from the left lower sternal border to the left preaxillary  line.  ABDOMEN:  Protuberant, nontender.  Intact bowel sounds, without bruits.  No  hepatomegaly.  EXTREMITIES:  Preserved bilateral femoral pulses, without bruits.  Intact  distal pulses, with 1+ pretibial edema.  NEUROLOGIC:  No focal deficits.   ADMISSION CHEST X-RAY:  Pending.   ADMISSION ELECTROCARDIOGRAM:  First-degree atrioventricular block at 77 BPM,  with Q waves noted in leads V1-V3.  Q waves and first-degree AVB are new  since EKG in 1999.   LABORATORY DATA:  Pending.   IMPRESSION:  1.  Congestive heart failure.   1A.  New onset.  1.  Abnormal resting electrocardiogram.  2A.  Question prior anteroseptal myocardial infarction.  1.  Systolic murmur.  3A.  Probable mitral regurgitation.  3B.  Question new onset.  1.  Frequent ventricular ectopy.  2.  First-degree atrioventricular block.  3.  Hypertension.   PLAN:  The patient will be admitted to telemetry for further evaluation of  new onset exertional dyspnea, with a new abnormal resting electrocardiogram.  Initial workup will consist of cycling cardiac markers and, in addition to  routine blood work, checking a BNP, TSH, and fasting lipid profile in the  morning.   Although the patient presents with no complaint of chest discomfort, he does  have significant exertional dyspnea, which is new, and presents with an  abnormal resting electrocardiogram suggestive of a possible recent  anteroseptal myocardial infarction.  Therefore, we will initiate workup with  a 2D echocardiogram in the morning for assessment of left ventricular  function, wall motion abnormalities, and severity of probable mitral  regurgitation.  We will then proceed with diagnostic cardiac catheterization  later that day, even if left ventricular function is normal by  echocardiography, given the patient's new onset exertional symptoms and  abnormal electrocardiogram.  The patient is agreeable with this plan, and  risks/benefits have been discussed.   Initial medical therapy will consist of a one-time dose of 40 mg IV Lasix  while in the emergency room, preceded by a chest x-ray.  Plan is to then  start low-dose captopril, if renal function is normal, with subsequent  addition of low-dose beta blocker prior to discharge.       GS/MEDQ  D:  11/12/2004  T:  11/12/2004  Job:  [id number]   cc:   Vale Haven. Andrey Campanile, M.D.  [address]  [place]  [place] [address]  Fax: [phone]

## 2010-09-21 NOTE — Discharge Summary (Signed)
Arthur Armstrong, Arthur Armstrong                ACCOUNT NO.:  [id number]   MEDICAL RECORD NO.:  [id number]          PATIENT TYPE:  INP   LOCATION:  2003                         FACILITY:  [facility]   PHYSICIAN:  Charlton Haws, M.D.     DATE OF BIRTH:  March 10, 1927   DATE OF ADMISSION:  11/12/2004  DATE OF DISCHARGE:  11/16/2004                                 DISCHARGE SUMMARY   PRIMARY CARE PHYSICIAN:  Duffy Rhody C. Andrey Campanile, M.D.   CARDIOLOGIST:  Vida Roller, M.D.   DISCHARGING DIAGNOSES:  1.  Dyspnea, status post transesophageal echocardiogram on November 13, 2004.      Repeat transesophageal echocardiogram on November 15, 2004, showing a      ventricular ejection fraction of 30-35%, small patent foramen ovale by      color Doppler with left-to-right flow, diffuse hypokinesis, with severe      mitral regurgitation.      1.  Congestive heart failure with new onset, BNP unavailable at this          time.      2.  Status post cardiac catheterization showing mild coronary          atherosclerosis with pulmonary hypertension, resting hypoxia with a          room air saturation of 89%.  2.  Chronic renal failure with creatinine 1.2-1.3.  3.  Hypertension.   PAST MEDICAL HISTORY:  1.  Left shoulder surgery 2004.  2.  Remote appendectomy.  3.  Bronchitis in March 2006.  4.  Patient reports having a reportedly negative stress test at the [place]      office approximately five years ago.   ALLERGIES:  No known drug allergies.   PROCEDURES PERFORMED DURING THIS HOSPITALIZATION:  Cardiac catheterization  on November 13, 2004, by Jonelle Sidle, M.D.  Patient with mild coronary  atherosclerosis, left ventricular ejection fraction approximately 25%, with  a 3+ mitral regurgitation and elevated left ventricular end-diastolic  pressure of 31 mmHg.  Pulmonary hypertension with a pulmonary artery  systolic pressure of 65 mmHg.   HISTORY OF PRESENT ILLNESS:  Arthur Armstrong is a 77 year old gentleman with no  known  history of cardiac disease.  He was referred directly to the emergency  room by his primary care physician for further evaluation of new-onset  exertional dyspnea that started approximately one week ago.  Initial EKG  showing a first degree AV block at 77 beats per minute with Q-waves noted in  leads V1-V3.  The Q-waves and a first degree AV block were new since EKG  done in 1999.  Patient with no complaints of chest discomfort but  significant exertional dyspnea which is new.  The patient was admitted for  further evaluation.  A 2-D echocardiogram done on July 11 showed EF of 35%  with hypokinesis of inferior wall and severe hypokinesis of posterior and  lateral walls.  Further evaluation done in the form of a TEE on July 11 and  repeated on July 13.  Patient with BNP initially 1100, responded well to  diuretics.  As CHF stabilized,  arranged for cardiac catheterization.  Patient to the catheterization lab on July 12 with Dr. Diona Browner, results as  stated above.  The patient tolerated cardiac catheterization without any  complications.  CHF stable.  Patient discharged home with anticipation of  following up with [facility] for pending surgery.   Prior to discharge, lab work showing hemoglobin 13.6 with a hematocrit of  39.4.  Potassium 3.5, which was supplemented with p.o. potassium prior to  discharge, glucose 123, BUN 12, creatinine 1.2.  Liver enzymes within normal  limits.  Hemoglobin A1c of 6.3.  Cardiac markers:  Troponin less than 0.04.  Total cholesterol 104, triglycerides 94, HDL 24 and LDL 61.  TSH of 1.307.  Patient discharged home with medications as follows:   1.  Aspirin 325 mg daily.  2.  Captopril 6.25 mg p.o. t.i.d.  3.  Lasix 40 mg p.o. b.i.d.  4.  Coreg 3.125 mg p.o. b.i.d.  5.  Potassium 20 mEq daily.   ACTIVITY:  No driving x2 days.  No lifting over 10 pounds x1 week.   WOUND CARE:  Gently clean catheterization site.  No tub bathing x2 days.  Patient instructed to all  our office for any problems from catheterization  site.   He was scheduled a follow-up appointment with Dr. Myrtis Ser for July 28 at 11  a.m. and Dr. Cornelius Moras of [facility] for Monday, July 17, at 9 a.m.      Dorian Pod, NP    ______________________________  Charlton Haws, M.D.    MB/MEDQ  D:  01/15/2005  T:  01/15/2005  Job:  [id number]   cc:   Willa Rough, M.D.  [address]  [address]  [place]  [place] [address]   Salvatore Decent. Cornelius Moras, M.D.  [address]  [place]  [place] [address]   Duffy Rhody C. Andrey Campanile, M.D.  [address]  [place]  [place] [address]  Fax: [phone]

## 2010-09-21 NOTE — Op Note (Signed)
NAMEMATHEUS, SPIKER                ACCOUNT NO.:  [id number]   MEDICAL RECORD NO.:  [id number]          PATIENT TYPE:  INP   LOCATION:  2305                         FACILITY:  [facility]   PHYSICIAN:  Zenon Mayo, MDDATE OF BIRTH:  December 29, 1926   DATE OF PROCEDURE:  11/28/2004  DATE OF DISCHARGE:                                 OPERATIVE REPORT   PROCEDURE:  Intraoperative echocardiogram.   INDICATION:  Evaluation of mitral valve.   DESCRIPTION:  Mr. Querry is a 77 year old gentleman with a history of  hypertension and congestive heart failure who was found to have mitral  regurgitation on previous echocardiogram.  He was brought to the operating  room today by Dr.  Cornelius Moras for repair or replacement of the valve.  Intraoperative transesophageal echocardiogram was requested to evaluate the  valve and its repair.  The patient was brought to the operating room and  placed under general anesthesia.  After confirmation of endotracheal tube  placement, a transesophageal echocardiogram probe was placed into his  esophagus without complication.  The left ventricle was imaged first and  revealed a dilated ventricle with global hypokinesis and dyskinesis at the  inferior septal region.  The ejection fraction was estimated to be  approximately 40%.  The mitral valve was then assessed.  Mitral valves,  anterior leaflet appeared thickened.  There were no calcifications or  vegetation seen.  The leaflets appeared to coapt well; however, when color  Doppler was placed across the valve moderate to severe mitral regurgitation  was seen within the eccentric jet passing over the posterior leaflet and up  the posterior wall of the left atrium.  There was no reversal of flow seen  in the pulmonary veins.  The left atrium was severely dilated.  There was no  thrombus noted in the left atrial appendage.  The interatrial septum had  significant bowing into the right atrium probably secondary to the  significant mitral regurgitation.  The aortic valve was then imaged.  The  aortic valve was trileaflet in nature.  There was no calcification or  vegetation seen; however, the leaflets did appear thickened.  Normal  excursion was appreciated with the leaflets and an aortic valve area of 2.13  cm2 was obtained by plain imagery.  There was trace amounts of aortic  insufficiency seen when color was placed across the valve.  The pulmonic  valve was unable to be imaged.  Tricuspid valve revealed mild tricuspid  regurgitation.  The interatrial septum revealed a patent foramen ovale with  left-to-right shunting.  Thoracic aorta revealed minimal atherosclerotic  disease.   At the conclusion of the procedure the heart was again imaged with the echo  probe.  A prosthetic ring had been placed around the mitral valve.  The ring  appeared to be seated well.  Only trace amounts of mitral regurgitation are  now appreciated.  The patent foramen ovale was nonexistent after its repair.  The left ventricular function was well preserved.  The patient had been  placed on dopamine and milrinone infusions with resulting cardiac outputs of  3.6 liters per  minute.  The patient remained stable throughout his post  bypass course in the operating room.  At the conclusion of the procedure,  the transesophageal echo probe was removed without any evidence of trauma.  The patient was taken from the operating room to the intensive care unit in  stable condition.     WEF/MEDQ  D:  11/28/2004  T:  11/28/2004  Job:  [id number]   cc:   Patient's Chart

## 2010-09-21 NOTE — Discharge Summary (Signed)
NAMEDONTARIOUS, SCHAUM                ACCOUNT NO.:  [id number]   MEDICAL RECORD NO.:  [id number]          PATIENT TYPE:  INP   LOCATION:  2003                         FACILITY:  [facility]   PHYSICIAN:  Dorian Pod, NP    DATE OF BIRTH:  05-13-1926   DATE OF ADMISSION:  11/12/2004  DATE OF DISCHARGE:  11/16/2004                                 DISCHARGE SUMMARY   ADDENDUM DISCHARGE SUMMARY   CORRECTION TO PREVIOUS DICTATION:  Please note:  The patient initially had a  2-dimensional echocardiogram done on November 13, 2004 and then a TEE done on  November 15, 2004, post cardiac catheterization.      Dorian Pod, NP     MB/MEDQ  D:  01/15/2005  T:  01/15/2005  Job:  [id number]

## 2010-09-21 NOTE — Discharge Summary (Signed)
NAME:  Arthur Armstrong, Arthur Armstrong NO.:  [id number]   MEDICAL RECORD NO.:  [id number]          PATIENT TYPE:  INP   LOCATION:  2041                         FACILITY:  [facility]   PHYSICIAN:  Salvatore Decent. Cornelius Moras, M.D. DATE OF BIRTH:  1926/12/15   DATE OF ADMISSION:  11/28/2004  DATE OF DISCHARGE:  12/08/2004                                 DISCHARGE SUMMARY   HISTORY OF PRESENT ILLNESS:  The patient is a 77 year old gentleman from  [place] with no previous history of cardiac disease followed by Dr.  Margrett Rud and has been in remarkably good health most of his life.  He,  however, over the past several months has had new onset symptoms of severe  exertional shortness of breath.  He denied any associated symptoms of chest  pain, chest tightness, palpitations, or dizzy spells.  He did, however, have  some tightness in his chest, when he was short of breath.  The shortness of  breath seemed to be brought on by physical activity and was relieved with  rest.  He reported worsening exercise intolerance and fatigue.  He denied  any episodes of resting shortness of breath, orthopnea or PND.  He did not  have any lower extremity edema.  The patient was referred to Dr. Vida Roller on July 10 due to symptoms as described above with new onset of a  cardiac murmur suspicious for mitral regurgitation.  Resting  electrocardiogram was notably first degree AV block with sinus rhythm.  He  was found to have Q waves and first degree AV block which was new since the  previous EKG done in 1999.  A 2-D echocardiogram was performed and this  demonstrated moderate left ventricular dysfunction with an ejection fraction  decreased to an estimate of 35% and notably there was findings of moderate  to severe mitral regurgitation with dilatation of the left atrium.  Left and  right heart catheterization was then performed on July 11.  Mr. Flenner was  found to have severe left ventricular dysfunction with  ejection fraction  estimated at 25% and moderate to severe mitral regurgitation with moderate  to severe pulmonary hypertension.  He had no significant coronary disease.  PA pressures were measured at 65/30 while his left ventricular systemic  pressures were 136/31.  Pulmonary capillary wedge pressure was 32 with a V  wave at approximately 45 mmHg.  Resting cardiac output was 4.8 L/min with  corresponding cardiac index of 2.4.  Mr. Osuna subsequently underwent a  transesophageal echocardiogram on July 13.  This confirmed the presence of  severe mitral regurgitation.  There was also a small patent foramen ovale  with left to right flow across the lumen.  There was diffuse hypokinesis of  the left ventricle with ejection fraction estimated at 30-35%.  The left  ventricular end-diastolic diameter was measured at greater than 50 mm.  Mr.  Hehir was then referred to Dr. Tressie Stalker for mitral valve repair.   REVIEW OF SYSTEMS:  Please see history and physical done at time of  admission.   PAST MEDICAL HISTORY:  1.  Hypertension.  2.  Congestive heart failure.  3.  Mitral valve disease as described.  4.  Patent foramen ovale as described.   PAST SURGICAL HISTORY:  1.  Right shoulder surgery.  2.  Bilateral knee arthroscopy.   FAMILY HISTORY, SOCIAL HISTORY, REVIEW OF SYSTEMS, AND PHYSICAL EXAMINATION:  Please see history and physical done at time of admission.   MEDICATIONS PRIOR TO ADMISSION:  Coreg, Lasix, potassium chloride, aspirin.   HOSPITAL COURSE:  The patient was admitted on November 28, 2004 and taken to the  operating room at which time he underwent a mitral valve repair with closure  of patent foramen ovale and ligation of the left atrial appendage.  The  valve repair was done using a 26 mm Edwards GeoForm annuloplasty and patient  had commissuroplasty x2.  The patient required no blood products and TEE  after separation from cardiopulmonary bypass showed no evidence of  mitral  regurgitation.  The patient was taken to the surgical intensive care unit in  stable condition.   POSTOPERATIVE HOSPITAL COURSE:  The patient has done quite well.  He has  remained hemodynamically stable, although has significant cardiac  bradyarrhythmias with AV block.  He required pacemaker during the initial  postoperative period and EP consultation was obtained with Dr. Graciela Husbands.  The  patient was followed closely and ultimately it was recommended that the  patient should proceed with placement of a biventricular ICD due to the left  ventricular dysfunction, nonsustained ventricular tachycardia and complete  heart-block.  This was performed on December 05, 2004 by Dr. Lewayne Bunting.  The  patient tolerated that procedure well, was taken to the post anesthesia care  unit in stable condition.   In the postoperative hospital course, the patient has done well.  He has  remained hemodynamically stable.  The pacemaker appears to be functioning in  the normal manner.  Laboratory values are stable.  Most recent hemoglobin  and hematocrit December 07, 2004 are 9.8 and 29.8 respectively.  Electrolytes,  BUN and creatinine are also all within normal limits.  Most recent INR dated  December 07, 2004 was 1.2.  The incision is healing well without evidence of  infection.  The patient has tolerated a gradual increase in activity  commensurate for level of postoperative convalescence.  Diabetes has been  under adequate control.  Currently, the patient is felt to be tentatively  stable for discharge on the morning of December 08, 2004 pending morning round  reevaluation.   MEDICATIONS ON DISCHARGE:  1.  Aspirin 325 mg daily.  2.  Altace 1.25 mg b.i.d.  3.  Lasix 40 mg q.a.m.  4.  Coreg 6.25 mg b.i.d.  5.  Coumadin dose to be determined at time of discharge.  Most recent INR      1.2 on August 4 at which time he received 5 mg.  The patient will also     be seen prior to discharge by the diabetes  coordinator for further      recommendations.  6.  Potassium chloride 10 mEq q.a.m.  7.  For pain, Tylox one to two every four to six hours as needed.   FINAL DIAGNOSIS:  Severe mitral regurgitation now status post mitral valve  repair and repair of patent foramen ovale now also status post placement of  a biventricular implantable cardioverter defibrillator for complete heart-  block and low left ventricular ejection fraction.   OTHER DIAGNOSES:  As previously listed per the history.   INSTRUCTIONS:  The patient received written instructions in regard to  medications, activity, diet, wound care, and followup.   FOLLOWUP:  Follow up with Dr. Cornelius Moras in three weeks.  He will follow up with  [facility] on August 16 at 9 a.m. and also see Dr. Ladona Ridgel in  November for pacemaker followup.  Dr. Orvan July appointment is December 31, 2004  at 2:30.  Coumadin will be checked on December 10, 2004 through [facility]  Coumadin clinic.       WEG/MEDQ  D:  12/07/2004  T:  12/08/2004  Job:  [id number]   cc:   Salvatore Decent. Cornelius Moras, M.D.  [address]  [place]  [place] [address]   Vida Roller, M.D.  Fax: [phone]   Duke Salvia, M.D.

## 2010-09-21 NOTE — Cardiovascular Report (Signed)
Arthur Armstrong, Arthur Armstrong                ACCOUNT NO.:  [id number]   MEDICAL RECORD NO.:  [id number]          PATIENT TYPE:  INP   LOCATION:  2003                         FACILITY:  [facility]   PHYSICIAN:  Jonelle Sidle, M.D. LHCDATE OF BIRTH:  1927-01-29   DATE OF PROCEDURE:  11/13/2004  DATE OF DISCHARGE:                              CARDIAC CATHETERIZATION   CARDIOLOGIST:  Vida Roller, M.D.   PRIMARY CARE PHYSICIAN:  Stanley C. Andrey Campanile, M.D.   INDICATIONS:  Mr. Cartwright is a 77 year old male recently admitted with new  onset exertional dyspnea and hypertension. He underwent a resting 2-D  echocardiogram which reports a left ventricular ejection fraction of 35%  with moderately severe mitral regurgitation. He is now referred for  diagnostic left and right heart catheterization for hemodynamic assessment  and coronary anatomy.   PROCEDURES PERFORMED:  1.  Left heart catheterization  2.  Right heart catheterization  3.  Selective coronary geography  4.  Left ventriculography.   ACCESS AND EQUIPMENT:  The area about the right femoral artery and vein was  anesthetized 1% lidocaine. A 6-French sheath was placed in the right femoral  artery via the modified Seldinger technique followed by an 8-French sheath  in the right femoral vein via the modified Seldinger technique. Standard  preformed 6-French JL-5 and JR-4 catheters were used for selective coronary  angiography, and an angled pigtail catheter was used for left heart  catheterization and left ventriculography. A standard 7-French balloon-  tipped flow-directed catheter was used for right heart catheterization and  hemodynamic assessment. All exchanges with the exception of the right heart  catheter were made over wire. The patient tolerated the procedure well  without any immediate complications.   HEMODYNAMICS:  Right atrium mean of 60 mmHg. Right ventricle 66/13 mmHg.  Pulmonary artery 65/30 mmHg. Pulmonary capillary wedge  pressure mean of 32  mmHg with V-waves to approximately 45 mmHg. Aorta 135/92 mmHg. Left  ventricle 136/31 mmHg. Cardiac output 4.8 by thermodilution method. Cardiac  index 2.4 by thermodilution method. Arterial saturation is 89% on room air.  Pulmonary artery saturation 58% on room air.   ANGIOGRAPHIC FINDINGS:  1.  Left main coronary artery is free of significant flow-limiting coronary      atherosclerosis. There is approximately 20% stenosis in the proximal      segment.  2.  The left anterior descending is a large caliber vessel with three      diagonal branches, the first of which is the largest and bifurcates.      There are minor luminal irregularities throughout, although in the      proximal left anterior descending there is approximately 40% tapering.  3.  The circumflex coronary artery is a large caliber vessel with four      obtuse marginal branches. There are minor luminal irregularities noted      throughout this system with 20% stenoses in the mid and distal segment  4.  The right coronary artery is a large dominant vessel with large      posterior descending branch. Minor luminal irregularities are noted  throughout.  5.  Left ventriculography was performed in the RAO projection and revealed      ejection fraction approximately 25% associated with 3+ mitral      regurgitation   DIAGNOSES:  1.  Mild coronary atherosclerosis.  2.  Left ventricular ejection fraction of approximately 25% with a 3+ mitral      regurgitation and elevated left ventricular end-diastolic pressure of 31      mmHg.  3.  Pulmonary hypertension with a pulmonary artery systolic pressure of 65      mmHg. The mean pulmonary capillary wedge pressure is 32 mmHg with V-      waves to 45 mmHg. The patient is also hypoxic on room air with arterial      saturation of 89%.   DISCUSSION:  I reviewed the results of the procedure with Dr. Myrtis Ser. At this  point, plan will be to proceed with a  transesophageal echocardiogram to  better define the etiology of the mitral regurgitation as this looks to be  fairly significant and symptom inducing. The standard surface study was  technically difficult. In the meanwhile, will continue to diurese and make  further plans when more information is available.        SGM/MEDQ  D:  11/14/2004  T:  11/14/2004  Job:  [id number]   cc:   Vida Roller, M.D.  Fax: [phone]   Vale Haven. Andrey Campanile, M.D.  [address]  [place]  [place] [address]  Fax: [phone]

## 2010-09-21 NOTE — H&P (Signed)
NAMEDEANTHONY, MAULL                ACCOUNT NO.:  [id number]   MEDICAL RECORD NO.:  [id number]          PATIENT TYPE:  INP   LOCATION:                               FACILITY:  [facility]   PHYSICIAN:  Salvatore Decent. Cornelius Moras, M.D. DATE OF BIRTH:  08/11/1922   DATE OF ADMISSION:  11/28/2004  DATE OF DISCHARGE:                                HISTORY & PHYSICAL   CHIEF COMPLAINT:  Exertional shortness of breath.   HISTORY OF PRESENT ILLNESS:  Mr. Higinbotham is a 77 year old gentleman from  [place] with no previous history of cardiac disease, was followed by Dr.  Margrett Rud and has been remarkably healthy most of his life. He reports  over the last month new onset symptoms of severe exertional shortness of  breath. He denies any associated symptoms of chest pain, chest tightness,  palpitations, or dizzy spells. He does admit that he gets some tightness in  his chest when he is short of breath. His shortness of breath seems to be  brought on by physical activity and relieved by rest. He reports worsening  exercise intolerance and fatigue. He denies any episodes of resting  shortness of breath, orthopnea, or PND. He has not had any lower extremity  edema.   Mr. Cerda was referred to Dr. Vida Roller on July 10 due to symptoms as  described above with new onset of a cardiac murmur suspicious for mitral  regurgitation. Resting electrocardiogram was notably first-degree A-V block  with sinus rhythm. He was found to have Q waves and first-degree A-V block  which was new since the previous EKG performed in 1999. A 2-D echocardiogram  was performed demonstrating moderate left ventricular dysfunction with  ejection fraction estimated 35% and moderate to severe mitral regurgitation  with dilatation of the left atrium. Left and right heart catheterization was  performed on July 11. Mr. Burdo was found to have severe left ventricular  dysfunction with ejection fraction estimated 25% with moderate to  severe  mitral regurgitation and moderate to severe pulmonary hypertension. He did  not have significant coronary artery disease, and in fact he had right-  dominant coronary circulation with only mild luminal irregularities in the  coronary arteries. PA pressures were measured 65/30 while his left  ventricular systemic pressures were 136/31. Pulmonary capillary wedge  pressure was 32 with a V wave at approximately 45 mmHg. Resting cardiac  output was 4.8 liters per minute with corresponding cardiac index of 2.4.  Mr. Haring subsequently underwent transesophageal echocardiogram on July 13.  This confirmed the presence of severe mitral regurgitation. There was also a  small patent foramen ovale with left-to-right flow across the lumen. There  was diffuse hypokinesis of the left ventricle with ejection fraction  estimated 30-35%. The left ventricular end-diastolic diameter was measured  greater than 50 mm. Mr. Hidrogo was referred for possible mitral valve repair.   REVIEW OF SYSTEMS:  GENERAL: Mr. Cott reports being remarkably healthy for  all of his life until recently. He reports good appetite. He has not been  gaining weight significantly, and he has lost a few  pounds in recent days on  oral diuretic therapy. CARDIAC: Notable for significant exertional shortness  of breath as well as some mild associated chest tightness with activity. The  patient denies resting shortness of breath, PND, orthopnea, or lower  extremity edema. He has not had palpitations or syncope. RESPIRATORY:  Negative. The patient denies productive cough, hemoptysis, wheezing.  GASTROINTESTINAL: Negative. The patient denies difficulty swallowing. He  denies symptoms of reflux. He reports no particular problem with  constipation or diarrhea, and he specifically denies history of  hematochezia, hematemesis, melena. GENITOURINARY: Negative. The patient  denies urinary frequency or urgency. PERIPHERAL VASCULAR: Negative.  The  patient denies symptoms suggestive of claudication. NEUROLOGIC: Negative.  The patient reports no dizzy spells. He denies transient neurologic deficits  or symptoms suggestive of seizures. MUSCULOSKELETAL: Notable for some mild  chronic pain in his right shoulder attributed to arthritis that developed  ever since a fall he suffered 1-1/2 years ago. PSYCHIATRIC: Negative. HEENT:  Negative. The patient is edentulous with full set upper and lower dentures.  He wears glasses for corrective eyesight, and his eyesight has been stable.   PAST MEDICAL HISTORY:  1.  Hypertension, newly diagnosed  2.  Congestive heart failure, newly diagnosed   PAST SURGICAL HISTORY:  1.  Right shoulder surgery.  2.  Bilateral knee arthroscopy   FAMILY HISTORY:  Noncontributory and notable for the absence of any family  members with rheumatic heart disease or valvular heart disease or  cardiomyopathy.   SOCIAL HISTORY:  The patient is married and lives with his wife here in  [place]. They have six grown children. 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 He is a nonsmoker, having  previously smoked, but he quit smoking completely in 1963. He denies history  of significant alcohol consumption.   CURRENT MEDICATIONS:  1.  Coreg.  2.  Lasix.  3.  Potassium chloride.  4.  Aspirin   DRUG ALLERGIES:  CODEINE causes itching and rash.   PHYSICAL EXAMINATION:  GENERAL:  The patient is a well-appearing male who  appears of stated age in no acute distress.  VITAL SIGNS:  Blood pressure is 124/80 pulse is 50 and regular, oxygen  saturation 95% on room air. He is afebrile.  HEENT:  Exam is grossly unrevealing.  NECK:  Is supple. There is no cervical or supraclavicular lymphadenopathy.  There is no jugular venous distension. CHEST: Auscultation of the chest reveals a few bibasilar inspiratory  rales.  No wheezes or rhonchi are noted, and breath sounds are symmetrical.  CARDIOVASCULAR:  Exam includes regular rate and rhythm. There is a grade 2-  3/6  holosystolic murmur heard best at the apex with radiation towards the  axilla. No diastolic murmurs are noted.  ABDOMEN:  Mildly obese but soft, nontender. There are no palpable masses.  The liver edge is not palpable. Bowel sounds are present.  EXTREMITIES:  Warm and well-perfused. There is no lower extremity edema.  Distal pulses are palpable in both lower legs in the posterior tibial  position. There is no sign of significant venous insufficiency.  RECTAL/GU:  Exams are both deferred.  NEUROLOGIC:  Examination was grossly nonfocal symmetrical throughout.   DIAGNOSTIC TESTS:  Cardiac catheterization performed July 11 is reviewed,  and findings are described as previously. There is right-dominant coronary  circulation with insignificant coronary artery disease. There is moderate to  severe left ventricular dysfunction with moderate to severe left mitral  regurgitation and moderate pulmonary hypertension.   Transesophageal echocardiogram performed on July 13 is also reviewed. This  confirmed the presence of moderate left ventricular dysfunction. There is  severe mitral regurgitation. This appears to be functional mitral  regurgitation with a broad central jet of mitral regurgitation due to  failure of coaptation of the valve leaflets. There is no mitral valve  prolapse. There is some suggestion of seagull deformity associated with  functional mitral regurgitation with tenting of the anterior leaflet of  mitral valve by the secondary cords. There is annular dilatation with  dilatation of the left atrium and left ventricle. The posterior leaflet of  mitral valve appears reasonably normal as well from a structural standpoint  with only tethering of the valve due to displacement of the lateral wall of  the left ventricle. The broad  jet of mitral regurgitation fills the majority  of the left atrium and is associated with some flow reversal in the  pulmonary veins. There is a small patent foramen ovale with left-to-right  flow.   IMPRESSION:  Severe mitral regurgitation with new onset symptoms of  congestive heart failure, functional class II to class III. The etiology of  Mr. Sheldon' underlying mitral regurgitation appears to be functional with  presumably idiopathic dilated cardiomyopathy. He does not describe any  symptoms or signs suggestive of any explanation for an underlying  cardiomyopathy, and there is no sign of significant ischemic heart disease.  I certainly agree that he would benefit from elective mitral valve repair.  He has a small patent foramen ovale which we will also plan to close at the  time of surgery.   PLAN:  I have discussed issues at length with Mr. Sear and his family. Alternative treatment strategies have been discussed. They understand and  accept all associated risks of surgery including but not limited to risk of  death, stroke, myocardial infarction, congestive heart failure, respiratory  failure, pneumonia, bleeding requiring blood transfusion, arrhythmia, heart  block with bradycardia requiring permanent pacemaker, possibility of  recurrent mitral regurgitation in the future. He understands that his  longevity may be more related to the underlying cardiomyopathy rather than  the mitral regurgitation itself. All their questions have been addressed. We  tentatively plan to proceed with surgery on Wednesday, July 26. He also  understands the possibility although relatively unlikely that valve repair  would not be feasible and that we would need to replace the valve. If this  is necessary, we would plan a bioprosthetic tissue valve. Under the  circumstances, I feel there is a high likelihood that valve repair will be  successful. All their questions been addressed.       CHO/MEDQ   D:  11/19/2004  T:  11/19/2004  Job:  [id number]   cc:   Vida Roller, M.D.  Fax: [phone]   Pricilla Riffle, M.D.   Stanley C. Andrey Campanile, M.D.  [address]  [place]  [place] [address]  Fax: [phone]

## 2010-09-21 NOTE — Op Note (Signed)
NAME:  Arthur Armstrong, Arthur Armstrong                          ACCOUNT NO.:  [id number]   MEDICAL RECORD NO.:  [id number]                   PATIENT TYPE:  AMB   LOCATION:  ENDO                                 [facility]ACILITY:  [facility]   PHYSICIAN:  Petra Kuba, M.D.                 DATE OF BIRTH:  April 23, 1927   DATE OF PROCEDURE:  08/30/2003  DATE OF DISCHARGE:                                 OPERATIVE REPORT   PROCEDURE:  Colonoscopy with polypectomy.   INDICATIONS FOR PROCEDURE:  Screening.   Consent was signed after risks, benefits, methods, and options were  thoroughly discussed multiple times in the past.   MEDICINES USED:  Demerol 40, Versed 4.   DESCRIPTION OF PROCEDURE:  Rectal inspection was pertinent for external  hemorrhoids.  Digital exam was negative.  The video regular colonoscope was  inserted, easily advanced around the colon to the cecum. This did not  require any abdominal pressure or any position changes.  On insertion, a  small distal sigmoid polyp was seen as well as some left sided diverticula.  No other abnormality was seen on insertion.  The cecum was identified by the  appendiceal orifice and the ileocecal valve. The scope was slowly withdrawn.  There was a tiny polyp along the cecal fold which was hot biopsied x1.  The  scope was further withdrawn. The prep was adequate. There was some left  liquid stool that require washing and suctioning.  On slow withdrawal  through the colon other than the left sided diverticula and the distal  sigmoid polyp which was seen on withdrawal which was snared, electrocautery  applied, polyp was suctioned through the scope and collected in the trap,  possibly there was some residual polypoid tissue which was hot biopsied x1  and put in the second container with the polyp already snared.  The scope  was slowly withdrawn back to the rectum. Anal rectal pullthrough and  retroflexion confirmed some small hemorrhoids.  The scope was reinserted a  short  ways up the left side of the colon, air was suctioned, scope removed.  The patient tolerated the procedure well.  There was no obvious or immediate  complication.   ENDOSCOPIC DIAGNOSIS:  1. Internal and external hemorrhoids.  2. Left sided diverticula.  3. Distal sigmoid small polyp snared and hot biopsied.  4. Tiny cecal polyp hot biopsied.  5. Otherwise within normal limits to the cecum.   PLAN:  Await pathology to determine future colonic screening.  Happy to see  back p.r.n. otherwise return care to Dr. Andrey Campanile for the customary health  care maintenance to include yearly rectals and guaiacs.  Petra Kuba, M.D.    MEM/MEDQ  D:  08/30/2003  T:  08/30/2003  Job:  [id number]   cc:   Vale Haven. Andrey Campanile, M.D.  [address]  [place]  [place] [address]  Fax: [phone]

## 2010-09-24 ENCOUNTER — Encounter (HOSPITAL_COMMUNITY): Payer: Self-pay

## 2010-09-25 ENCOUNTER — Encounter (HOSPITAL_COMMUNITY): Payer: Self-pay

## 2010-09-27 ENCOUNTER — Telehealth: Payer: Self-pay | Admitting: Internal Medicine

## 2010-09-27 ENCOUNTER — Encounter (HOSPITAL_COMMUNITY): Payer: Self-pay

## 2010-09-27 NOTE — Telephone Encounter (Signed)
All Cardiac faxed to [facility] @ [phone]  09/26/10/km

## 2010-10-01 ENCOUNTER — Encounter (HOSPITAL_COMMUNITY): Payer: Self-pay

## 2010-10-02 ENCOUNTER — Encounter: Payer: No Typology Code available for payment source | Admitting: Internal Medicine

## 2010-10-02 ENCOUNTER — Encounter (HOSPITAL_COMMUNITY): Payer: Self-pay

## 2010-10-04 ENCOUNTER — Encounter (HOSPITAL_COMMUNITY): Payer: Self-pay

## 2010-10-08 ENCOUNTER — Encounter (HOSPITAL_COMMUNITY): Payer: Self-pay

## 2010-10-09 ENCOUNTER — Encounter (HOSPITAL_COMMUNITY): Payer: Self-pay

## 2010-10-11 ENCOUNTER — Encounter (HOSPITAL_COMMUNITY): Payer: Self-pay

## 2010-10-15 ENCOUNTER — Encounter (HOSPITAL_COMMUNITY): Payer: Self-pay

## 2010-10-16 ENCOUNTER — Encounter (HOSPITAL_COMMUNITY): Payer: Self-pay

## 2010-10-18 ENCOUNTER — Encounter (HOSPITAL_COMMUNITY): Payer: Self-pay

## 2010-10-22 ENCOUNTER — Encounter (HOSPITAL_COMMUNITY): Payer: Self-pay

## 2010-10-23 ENCOUNTER — Encounter (HOSPITAL_COMMUNITY): Payer: Self-pay

## 2010-10-25 ENCOUNTER — Encounter (HOSPITAL_COMMUNITY): Payer: Self-pay

## 2010-10-29 ENCOUNTER — Encounter (HOSPITAL_COMMUNITY): Payer: Self-pay

## 2010-10-30 ENCOUNTER — Encounter: Payer: No Typology Code available for payment source | Admitting: Internal Medicine

## 2010-10-30 ENCOUNTER — Encounter (HOSPITAL_COMMUNITY): Payer: Self-pay

## 2010-11-01 ENCOUNTER — Encounter (HOSPITAL_COMMUNITY): Payer: Self-pay

## 2010-11-05 ENCOUNTER — Encounter (HOSPITAL_COMMUNITY): Payer: Self-pay

## 2010-11-05 ENCOUNTER — Other Ambulatory Visit: Payer: Self-pay | Admitting: Internal Medicine

## 2010-11-06 ENCOUNTER — Other Ambulatory Visit: Payer: Self-pay | Admitting: Internal Medicine

## 2010-11-06 ENCOUNTER — Encounter (HOSPITAL_COMMUNITY): Payer: Self-pay

## 2010-11-08 ENCOUNTER — Encounter (HOSPITAL_COMMUNITY): Payer: Self-pay

## 2010-11-12 ENCOUNTER — Encounter (HOSPITAL_COMMUNITY): Payer: Self-pay

## 2010-11-13 ENCOUNTER — Encounter (HOSPITAL_COMMUNITY): Payer: Self-pay

## 2010-11-15 ENCOUNTER — Encounter (HOSPITAL_COMMUNITY): Payer: Self-pay

## 2010-11-19 ENCOUNTER — Encounter (HOSPITAL_COMMUNITY): Payer: Self-pay

## 2010-11-20 ENCOUNTER — Encounter (HOSPITAL_COMMUNITY): Payer: Self-pay

## 2010-11-22 ENCOUNTER — Encounter (HOSPITAL_COMMUNITY): Payer: Self-pay

## 2010-11-26 ENCOUNTER — Encounter (HOSPITAL_COMMUNITY): Payer: Self-pay

## 2010-11-27 ENCOUNTER — Encounter (HOSPITAL_COMMUNITY): Payer: Self-pay

## 2010-11-29 ENCOUNTER — Encounter (HOSPITAL_COMMUNITY): Payer: Self-pay

## 2010-12-02 ENCOUNTER — Other Ambulatory Visit: Payer: Self-pay | Admitting: Internal Medicine

## 2010-12-03 ENCOUNTER — Encounter (HOSPITAL_COMMUNITY): Payer: Self-pay

## 2010-12-04 ENCOUNTER — Encounter (HOSPITAL_COMMUNITY): Payer: Self-pay

## 2010-12-04 ENCOUNTER — Encounter: Payer: Self-pay | Admitting: Internal Medicine

## 2010-12-06 ENCOUNTER — Encounter (HOSPITAL_COMMUNITY): Payer: Self-pay

## 2010-12-09 ENCOUNTER — Other Ambulatory Visit: Payer: Self-pay | Admitting: Internal Medicine

## 2010-12-10 ENCOUNTER — Encounter (HOSPITAL_COMMUNITY): Payer: Self-pay

## 2010-12-10 ENCOUNTER — Other Ambulatory Visit: Payer: Self-pay | Admitting: *Deleted

## 2010-12-10 MED ORDER — POTASSIUM CHLORIDE ER 10 MEQ PO CPCR: 10.0000 meq | ORAL_CAPSULE | Freq: Every day | ORAL | Status: DC

## 2010-12-11 ENCOUNTER — Encounter (HOSPITAL_COMMUNITY): Payer: Self-pay

## 2010-12-13 ENCOUNTER — Encounter: Payer: Self-pay | Admitting: Internal Medicine

## 2010-12-13 ENCOUNTER — Ambulatory Visit (INDEPENDENT_AMBULATORY_CARE_PROVIDER_SITE_OTHER): Payer: No Typology Code available for payment source | Admitting: Internal Medicine

## 2010-12-13 ENCOUNTER — Encounter (HOSPITAL_COMMUNITY): Payer: Self-pay

## 2010-12-13 DIAGNOSIS — Z95810 Presence of automatic (implantable) cardiac defibrillator: Secondary | ICD-10-CM

## 2010-12-13 DIAGNOSIS — I428 Other cardiomyopathies: Secondary | ICD-10-CM

## 2010-12-13 DIAGNOSIS — I509 Heart failure, unspecified: Secondary | ICD-10-CM

## 2010-12-13 DIAGNOSIS — I4891 Unspecified atrial fibrillation: Secondary | ICD-10-CM

## 2010-12-13 LAB — ICD DEVICE OBSERVATION
AL AMPLITUDE: 0.7 mv
DEVICE MODEL ICD: 623851
HV IMPEDENCE: 43 Ohm
RV LEAD IMPEDENCE ICD: 410 Ohm
TZON-0003FASTVT: 300 ms

## 2010-12-13 MED ORDER — FUROSEMIDE 40 MG PO TABS: ORAL_TABLET | ORAL | Status: DC

## 2010-12-13 NOTE — Progress Notes (Signed)
HPI Mr. Forrey returns today for followup. He is a very pleasant 77 year old man with a long-standing nonischemic cardiomyopathy, chronic systolic heart failure, hypertension, and dyslipidemia. The patient continues to do well. He has class II congestive heart failure symptoms. He denies chest pain, shortness of breath, or peripheral edema. No Known Allergies   Current Outpatient Prescriptions  Medication Sig Dispense Refill  . aspirin EC 325 MG EC tablet Take 325 mg by mouth daily.        . carvedilol (COREG) 6.25 MG tablet TAKE 1 TABLET BY MOUTH TWICE DAILY  180 tablet  0  . furosemide (LASIX) 40 MG tablet Take one and one half tablets once daily  135 tablet  4  . niacin (NIASPAN) 500 MG CR tablet Take 500 mg by mouth daily.        . potassium chloride (K-DUR) 10 MEQ tablet TAKE 1 TABLET BY MOUTH DAILY  30 tablet  0  . ramipril (ALTACE) 2.5 MG capsule Take 2.5 mg by mouth 2 (two) times daily.        . Tamsulosin HCl (FLOMAX) 0.4 MG CAPS Take 0.4 mg by mouth at bedtime.           Past Medical History  Diagnosis Date  . Automatic implantable cardiac defibrillator in situ     BiV pacer  . Secondary cardiomyopathy, unspecified     nicm ef 30-35%,nonob cad-cath 2006,echo 2/12:ef30-35%.mod lvh,inf hk,mild-mod lae and mild rae,pasp 35  . Congestive heart failure, unspecified   . Unspecified essential hypertension   . History of atrial fibrillation     ROS:   All systems reviewed and negative except as noted in the HPI.   Past Surgical History  Procedure Date  . Rt. shoulder   . Bilateral knee arthroscopy   . Colonoscopy with polypectomy   . Intraoperative transesophageal echocardiogram   . Median sterotomy for mitral valve repair   . Implantation of a dual chamber biventricular icd      Family History  Problem Relation Age of Onset  . Heart disease Neg Hx     Rheumatic or valvular heart disease   . Cardiomyopathy Neg Hx      History   Social History  . Marital Status:  Married    Spouse Name: N/A    Number of Children: 6  . Years of Education: N/A   Occupational History  . 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 [employer] in his church    Social History Main Topics  . Smoking status: Former Smoker    Quit date: 05/06/1961  . Smokeless tobacco: Not on file  . Alcohol Use: No  . Drug Use:   . Sexually Active:    Other Topics Concern  . Not on file   Social History Narrative   Lives with his wife in [place].Has 6 grown children.Remains quite active.     BP 132/84  Pulse 71  Ht 5\' 7"  (1.702 m)  Wt 174 lb 12.8 oz (79.289 kg)  BMI 27.38 kg/m2  Physical Exam:  Well appearing NAD HEENT: Unremarkable Neck:  No JVD, no thyromegally Lymphatics:  No adenopathy Back:  No CVA tenderness Lungs:  Clear. Well-healed ICD incision. HEART:  Regular rate rhythm, no murmurs, no rubs, no clicks Abd:  soft, positive bowel sounds, no organomegally, no rebound, no guarding Ext:  2 plus pulses, no edema, no cyanosis, no clubbing Skin:  No rashes no nodules Neuro:  CN II through XII intact,  motor grossly intact  DEVICE  Normal device function.  See PaceArt for details.   Assess/Plan:

## 2010-12-13 NOTE — Assessment & Plan Note (Signed)
He is currently class II. I've encouraged him to maintain a low-sodium diet, and remain active, and continue his current medical therapy.

## 2010-12-13 NOTE — Assessment & Plan Note (Signed)
His device is working normally. We'll plan to recheck in several months. 

## 2010-12-13 NOTE — Patient Instructions (Signed)
Your physician wants you to follow-up in: one year  You will receive a reminder letter in the mail two months in advance. If you don't receive a letter, please call our office to schedule the follow-up appointment.   INCREASE FUROSEMIDE TO 40MG  ONE AND ONE HALF TABLETS DAILY

## 2010-12-13 NOTE — Assessment & Plan Note (Signed)
He denies palpitations. He appears to be free of symptoms.

## 2010-12-17 ENCOUNTER — Encounter (HOSPITAL_COMMUNITY): Payer: Self-pay

## 2010-12-18 ENCOUNTER — Encounter (HOSPITAL_COMMUNITY): Payer: Self-pay

## 2010-12-20 ENCOUNTER — Encounter (HOSPITAL_COMMUNITY): Payer: Self-pay

## 2010-12-24 ENCOUNTER — Encounter (HOSPITAL_COMMUNITY): Payer: Self-pay

## 2010-12-25 ENCOUNTER — Encounter (HOSPITAL_COMMUNITY): Payer: Self-pay

## 2010-12-27 ENCOUNTER — Encounter (HOSPITAL_COMMUNITY): Payer: Self-pay

## 2010-12-31 ENCOUNTER — Encounter (HOSPITAL_COMMUNITY): Payer: Self-pay

## 2011-01-01 ENCOUNTER — Encounter (HOSPITAL_COMMUNITY): Payer: Self-pay

## 2011-01-03 ENCOUNTER — Encounter (HOSPITAL_COMMUNITY): Payer: Self-pay

## 2011-01-07 ENCOUNTER — Encounter (HOSPITAL_COMMUNITY): Payer: Self-pay

## 2011-01-08 ENCOUNTER — Encounter (HOSPITAL_COMMUNITY): Payer: Self-pay

## 2011-01-10 ENCOUNTER — Encounter (HOSPITAL_COMMUNITY): Payer: Self-pay

## 2011-01-14 ENCOUNTER — Encounter (HOSPITAL_COMMUNITY): Payer: Self-pay

## 2011-01-15 ENCOUNTER — Encounter (HOSPITAL_COMMUNITY): Payer: Self-pay

## 2011-01-17 ENCOUNTER — Telehealth: Payer: Self-pay | Admitting: Internal Medicine

## 2011-01-17 ENCOUNTER — Encounter (HOSPITAL_COMMUNITY): Payer: Self-pay

## 2011-01-17 NOTE — Telephone Encounter (Signed)
Pt calling to see if paperwork has been faxed for pt to return to [facility] for rehab. Please return pt call to clarify/discuss further.

## 2011-01-17 NOTE — Telephone Encounter (Signed)
RN s/w Pt re: [facility] needed a note stating ok to return to exercising. Pt states Dr Ladona Ridgel said it was ok during his last visit. RN informed Pt Dr Ladona Ridgel and his nurse Tresa Endo are out of the office, will follow up on the note by Monday, Sept 17. Pt verbalizes understanding.

## 2011-01-18 ENCOUNTER — Telehealth: Payer: Self-pay | Admitting: *Deleted

## 2011-01-18 DIAGNOSIS — T82198A Other mechanical complication of other cardiac electronic device, initial encounter: Secondary | ICD-10-CM

## 2011-01-18 NOTE — Telephone Encounter (Signed)
Checking lead 

## 2011-01-18 NOTE — Telephone Encounter (Signed)
Faxing back form

## 2011-01-21 ENCOUNTER — Encounter (HOSPITAL_COMMUNITY): Payer: Self-pay

## 2011-01-22 ENCOUNTER — Encounter (HOSPITAL_COMMUNITY): Payer: Self-pay

## 2011-01-24 ENCOUNTER — Encounter (HOSPITAL_COMMUNITY): Payer: Self-pay

## 2011-01-28 ENCOUNTER — Ambulatory Visit (INDEPENDENT_AMBULATORY_CARE_PROVIDER_SITE_OTHER)
Admission: RE | Admit: 2011-01-28 | Discharge: 2011-01-28 | Disposition: A | Discharge status: Home / Self Care | Payer: No Typology Code available for payment source | Source: Ambulatory Visit | Attending: Internal Medicine | Admitting: Internal Medicine

## 2011-01-28 ENCOUNTER — Encounter (HOSPITAL_COMMUNITY): Admission: RE | Admit: 2011-01-28 | Payer: Self-pay | Source: Ambulatory Visit

## 2011-01-28 ENCOUNTER — Encounter (HOSPITAL_COMMUNITY): Payer: Self-pay

## 2011-01-28 DIAGNOSIS — Q211 Atrial septal defect: Secondary | ICD-10-CM | POA: Insufficient documentation

## 2011-01-28 DIAGNOSIS — I472 Ventricular tachycardia, unspecified: Secondary | ICD-10-CM | POA: Insufficient documentation

## 2011-01-28 DIAGNOSIS — I442 Atrioventricular block, complete: Secondary | ICD-10-CM | POA: Insufficient documentation

## 2011-01-28 DIAGNOSIS — E119 Type 2 diabetes mellitus without complications: Secondary | ICD-10-CM | POA: Insufficient documentation

## 2011-01-28 DIAGNOSIS — T82198A Other mechanical complication of other cardiac electronic device, initial encounter: Secondary | ICD-10-CM

## 2011-01-28 DIAGNOSIS — Z7901 Long term (current) use of anticoagulants: Secondary | ICD-10-CM | POA: Insufficient documentation

## 2011-01-28 DIAGNOSIS — I498 Other specified cardiac arrhythmias: Secondary | ICD-10-CM | POA: Insufficient documentation

## 2011-01-28 DIAGNOSIS — Q2111 Secundum atrial septal defect: Secondary | ICD-10-CM | POA: Insufficient documentation

## 2011-01-28 DIAGNOSIS — I2789 Other specified pulmonary heart diseases: Secondary | ICD-10-CM | POA: Insufficient documentation

## 2011-01-28 DIAGNOSIS — I440 Atrioventricular block, first degree: Secondary | ICD-10-CM | POA: Insufficient documentation

## 2011-01-28 DIAGNOSIS — I10 Essential (primary) hypertension: Secondary | ICD-10-CM | POA: Insufficient documentation

## 2011-01-28 DIAGNOSIS — Z8249 Family history of ischemic heart disease and other diseases of the circulatory system: Secondary | ICD-10-CM | POA: Insufficient documentation

## 2011-01-28 DIAGNOSIS — I509 Heart failure, unspecified: Secondary | ICD-10-CM | POA: Insufficient documentation

## 2011-01-28 DIAGNOSIS — Z95810 Presence of automatic (implantable) cardiac defibrillator: Secondary | ICD-10-CM | POA: Insufficient documentation

## 2011-01-28 DIAGNOSIS — Z9889 Other specified postprocedural states: Secondary | ICD-10-CM | POA: Insufficient documentation

## 2011-01-28 DIAGNOSIS — E669 Obesity, unspecified: Secondary | ICD-10-CM | POA: Insufficient documentation

## 2011-01-28 DIAGNOSIS — I4729 Other ventricular tachycardia: Secondary | ICD-10-CM | POA: Insufficient documentation

## 2011-01-28 DIAGNOSIS — I428 Other cardiomyopathies: Secondary | ICD-10-CM | POA: Insufficient documentation

## 2011-01-28 DIAGNOSIS — Z5189 Encounter for other specified aftercare: Secondary | ICD-10-CM | POA: Insufficient documentation

## 2011-01-29 ENCOUNTER — Encounter (HOSPITAL_COMMUNITY): Payer: Self-pay

## 2011-01-29 ENCOUNTER — Encounter (HOSPITAL_COMMUNITY): Payer: Self-pay | Attending: Internal Medicine

## 2011-01-31 ENCOUNTER — Encounter (HOSPITAL_COMMUNITY): Payer: Self-pay

## 2011-02-04 ENCOUNTER — Encounter (HOSPITAL_COMMUNITY): Payer: Self-pay

## 2011-02-04 ENCOUNTER — Encounter (HOSPITAL_COMMUNITY): Payer: Self-pay | Attending: Internal Medicine

## 2011-02-04 DIAGNOSIS — Z9889 Other specified postprocedural states: Secondary | ICD-10-CM | POA: Insufficient documentation

## 2011-02-04 DIAGNOSIS — I10 Essential (primary) hypertension: Secondary | ICD-10-CM | POA: Insufficient documentation

## 2011-02-04 DIAGNOSIS — E119 Type 2 diabetes mellitus without complications: Secondary | ICD-10-CM | POA: Insufficient documentation

## 2011-02-04 DIAGNOSIS — I2789 Other specified pulmonary heart diseases: Secondary | ICD-10-CM | POA: Insufficient documentation

## 2011-02-04 DIAGNOSIS — I442 Atrioventricular block, complete: Secondary | ICD-10-CM | POA: Insufficient documentation

## 2011-02-04 DIAGNOSIS — E669 Obesity, unspecified: Secondary | ICD-10-CM | POA: Insufficient documentation

## 2011-02-04 DIAGNOSIS — Z5189 Encounter for other specified aftercare: Secondary | ICD-10-CM | POA: Insufficient documentation

## 2011-02-04 DIAGNOSIS — Q2111 Secundum atrial septal defect: Secondary | ICD-10-CM | POA: Insufficient documentation

## 2011-02-04 DIAGNOSIS — I498 Other specified cardiac arrhythmias: Secondary | ICD-10-CM | POA: Insufficient documentation

## 2011-02-04 DIAGNOSIS — I4729 Other ventricular tachycardia: Secondary | ICD-10-CM | POA: Insufficient documentation

## 2011-02-04 DIAGNOSIS — Q211 Atrial septal defect: Secondary | ICD-10-CM | POA: Insufficient documentation

## 2011-02-04 DIAGNOSIS — I472 Ventricular tachycardia, unspecified: Secondary | ICD-10-CM | POA: Insufficient documentation

## 2011-02-04 DIAGNOSIS — Z7901 Long term (current) use of anticoagulants: Secondary | ICD-10-CM | POA: Insufficient documentation

## 2011-02-04 DIAGNOSIS — Z8249 Family history of ischemic heart disease and other diseases of the circulatory system: Secondary | ICD-10-CM | POA: Insufficient documentation

## 2011-02-04 DIAGNOSIS — Z95810 Presence of automatic (implantable) cardiac defibrillator: Secondary | ICD-10-CM | POA: Insufficient documentation

## 2011-02-04 DIAGNOSIS — I440 Atrioventricular block, first degree: Secondary | ICD-10-CM | POA: Insufficient documentation

## 2011-02-04 DIAGNOSIS — I428 Other cardiomyopathies: Secondary | ICD-10-CM | POA: Insufficient documentation

## 2011-02-04 DIAGNOSIS — I509 Heart failure, unspecified: Secondary | ICD-10-CM | POA: Insufficient documentation

## 2011-02-05 ENCOUNTER — Encounter (HOSPITAL_COMMUNITY): Payer: Self-pay

## 2011-02-05 NOTE — Progress Notes (Signed)
Patient did not show.  This encounter was created in error - please disregard.

## 2011-02-07 ENCOUNTER — Encounter (HOSPITAL_COMMUNITY): Payer: Self-pay

## 2011-02-11 ENCOUNTER — Encounter (HOSPITAL_COMMUNITY): Payer: Self-pay

## 2011-02-12 ENCOUNTER — Encounter (HOSPITAL_COMMUNITY): Payer: Self-pay

## 2011-02-14 ENCOUNTER — Encounter (HOSPITAL_COMMUNITY): Payer: Self-pay

## 2011-02-18 ENCOUNTER — Encounter (HOSPITAL_COMMUNITY): Payer: Self-pay

## 2011-02-19 ENCOUNTER — Encounter (HOSPITAL_COMMUNITY): Payer: Self-pay

## 2011-02-21 ENCOUNTER — Encounter (HOSPITAL_COMMUNITY): Payer: Self-pay

## 2011-02-25 ENCOUNTER — Encounter (HOSPITAL_COMMUNITY): Payer: Self-pay

## 2011-02-26 ENCOUNTER — Encounter (HOSPITAL_COMMUNITY): Payer: Self-pay

## 2011-02-28 ENCOUNTER — Encounter (HOSPITAL_COMMUNITY): Payer: Self-pay

## 2011-03-04 ENCOUNTER — Other Ambulatory Visit: Payer: Self-pay | Admitting: Internal Medicine

## 2011-03-04 ENCOUNTER — Encounter (HOSPITAL_COMMUNITY): Payer: Self-pay

## 2011-03-05 ENCOUNTER — Encounter (HOSPITAL_COMMUNITY): Payer: Self-pay

## 2011-03-07 ENCOUNTER — Encounter (HOSPITAL_COMMUNITY): Payer: Self-pay

## 2011-03-07 DIAGNOSIS — Z9889 Other specified postprocedural states: Secondary | ICD-10-CM | POA: Insufficient documentation

## 2011-03-07 DIAGNOSIS — Q2111 Secundum atrial septal defect: Secondary | ICD-10-CM | POA: Insufficient documentation

## 2011-03-07 DIAGNOSIS — I2789 Other specified pulmonary heart diseases: Secondary | ICD-10-CM | POA: Insufficient documentation

## 2011-03-07 DIAGNOSIS — I4729 Other ventricular tachycardia: Secondary | ICD-10-CM | POA: Insufficient documentation

## 2011-03-07 DIAGNOSIS — I442 Atrioventricular block, complete: Secondary | ICD-10-CM | POA: Insufficient documentation

## 2011-03-07 DIAGNOSIS — Z5189 Encounter for other specified aftercare: Secondary | ICD-10-CM | POA: Insufficient documentation

## 2011-03-07 DIAGNOSIS — Z8249 Family history of ischemic heart disease and other diseases of the circulatory system: Secondary | ICD-10-CM | POA: Insufficient documentation

## 2011-03-07 DIAGNOSIS — I440 Atrioventricular block, first degree: Secondary | ICD-10-CM | POA: Insufficient documentation

## 2011-03-07 DIAGNOSIS — I509 Heart failure, unspecified: Secondary | ICD-10-CM | POA: Insufficient documentation

## 2011-03-07 DIAGNOSIS — I428 Other cardiomyopathies: Secondary | ICD-10-CM | POA: Insufficient documentation

## 2011-03-07 DIAGNOSIS — E669 Obesity, unspecified: Secondary | ICD-10-CM | POA: Insufficient documentation

## 2011-03-07 DIAGNOSIS — I498 Other specified cardiac arrhythmias: Secondary | ICD-10-CM | POA: Insufficient documentation

## 2011-03-07 DIAGNOSIS — I472 Ventricular tachycardia, unspecified: Secondary | ICD-10-CM | POA: Insufficient documentation

## 2011-03-07 DIAGNOSIS — Z7901 Long term (current) use of anticoagulants: Secondary | ICD-10-CM | POA: Insufficient documentation

## 2011-03-07 DIAGNOSIS — I10 Essential (primary) hypertension: Secondary | ICD-10-CM | POA: Insufficient documentation

## 2011-03-07 DIAGNOSIS — E119 Type 2 diabetes mellitus without complications: Secondary | ICD-10-CM | POA: Insufficient documentation

## 2011-03-07 DIAGNOSIS — Q211 Atrial septal defect: Secondary | ICD-10-CM | POA: Insufficient documentation

## 2011-03-07 DIAGNOSIS — Z95810 Presence of automatic (implantable) cardiac defibrillator: Secondary | ICD-10-CM | POA: Insufficient documentation

## 2011-03-11 ENCOUNTER — Encounter (HOSPITAL_COMMUNITY): Payer: Self-pay

## 2011-03-12 ENCOUNTER — Encounter (HOSPITAL_COMMUNITY): Payer: Self-pay

## 2011-03-14 ENCOUNTER — Encounter: Payer: No Typology Code available for payment source | Admitting: *Deleted

## 2011-03-14 ENCOUNTER — Encounter (HOSPITAL_COMMUNITY): Payer: Self-pay

## 2011-03-18 ENCOUNTER — Encounter (HOSPITAL_COMMUNITY): Payer: Self-pay

## 2011-03-19 ENCOUNTER — Encounter (HOSPITAL_COMMUNITY): Payer: Self-pay

## 2011-03-20 ENCOUNTER — Encounter: Payer: Self-pay | Admitting: *Deleted

## 2011-03-21 ENCOUNTER — Encounter (HOSPITAL_COMMUNITY): Payer: Self-pay

## 2011-03-25 ENCOUNTER — Encounter (HOSPITAL_COMMUNITY): Payer: Self-pay

## 2011-03-25 ENCOUNTER — Ambulatory Visit (INDEPENDENT_AMBULATORY_CARE_PROVIDER_SITE_OTHER): Payer: No Typology Code available for payment source | Admitting: *Deleted

## 2011-03-25 ENCOUNTER — Encounter (HOSPITAL_COMMUNITY)
Admission: RE | Admit: 2011-03-25 | Discharge: 2011-03-25 | Disposition: A | Discharge status: Home / Self Care | Payer: Self-pay | Source: Ambulatory Visit | Attending: Internal Medicine | Admitting: Internal Medicine

## 2011-03-25 DIAGNOSIS — I2589 Other forms of chronic ischemic heart disease: Secondary | ICD-10-CM

## 2011-03-25 DIAGNOSIS — I429 Cardiomyopathy, unspecified: Secondary | ICD-10-CM

## 2011-03-25 DIAGNOSIS — Z95810 Presence of automatic (implantable) cardiac defibrillator: Secondary | ICD-10-CM

## 2011-03-25 DIAGNOSIS — I4891 Unspecified atrial fibrillation: Secondary | ICD-10-CM

## 2011-03-26 ENCOUNTER — Encounter (HOSPITAL_COMMUNITY)
Admission: RE | Admit: 2011-03-26 | Discharge: 2011-03-26 | Disposition: A | Discharge status: Home / Self Care | Payer: Self-pay | Source: Ambulatory Visit | Attending: Internal Medicine | Admitting: Internal Medicine

## 2011-03-26 ENCOUNTER — Encounter: Payer: Self-pay | Admitting: Internal Medicine

## 2011-03-26 ENCOUNTER — Encounter (HOSPITAL_COMMUNITY): Payer: Self-pay

## 2011-03-26 ENCOUNTER — Other Ambulatory Visit: Payer: Self-pay | Admitting: Internal Medicine

## 2011-03-26 LAB — REMOTE ICD DEVICE
AL IMPEDENCE ICD: 460 Ohm
BAMS-0001: 150 {beats}/min
BRDY-0002RV: 60 {beats}/min
BRDY-0004RV: 120 {beats}/min
LV LEAD IMPEDENCE ICD: 350 Ohm
RV LEAD IMPEDENCE ICD: 430 Ohm
TZAT-0001FASTVT: 1
TZAT-0001SLOWVT: 1
TZAT-0004FASTVT: 8
TZAT-0012FASTVT: 200 ms
TZAT-0018SLOWVT: NEGATIVE
TZAT-0019FASTVT: 7.5 V
TZAT-0019SLOWVT: 7.5 V
TZON-0003SLOWVT: 375 ms
TZON-0010FASTVT: 40 ms
TZON-0010SLOWVT: 40 ms
TZST-0001FASTVT: 3
TZST-0001FASTVT: 4
TZST-0001FASTVT: 5
TZST-0001SLOWVT: 2
TZST-0003FASTVT: 20 J
TZST-0003FASTVT: 40 J
TZST-0003SLOWVT: 40 J

## 2011-03-27 NOTE — Progress Notes (Signed)
Remote icd check  

## 2011-03-28 ENCOUNTER — Encounter (HOSPITAL_COMMUNITY): Payer: Self-pay

## 2011-04-01 ENCOUNTER — Encounter (HOSPITAL_COMMUNITY)
Admission: RE | Admit: 2011-04-01 | Discharge: 2011-04-01 | Disposition: A | Discharge status: Home / Self Care | Payer: Self-pay | Source: Ambulatory Visit | Attending: Internal Medicine | Admitting: Internal Medicine

## 2011-04-01 ENCOUNTER — Encounter (HOSPITAL_COMMUNITY): Payer: Self-pay

## 2011-04-02 ENCOUNTER — Encounter (HOSPITAL_COMMUNITY): Payer: Self-pay

## 2011-04-02 ENCOUNTER — Encounter (HOSPITAL_COMMUNITY)
Admission: RE | Admit: 2011-04-02 | Discharge: 2011-04-02 | Disposition: A | Discharge status: Home / Self Care | Payer: Self-pay | Source: Ambulatory Visit | Attending: Internal Medicine | Admitting: Internal Medicine

## 2011-04-04 ENCOUNTER — Encounter (HOSPITAL_COMMUNITY): Payer: Self-pay

## 2011-04-04 ENCOUNTER — Encounter (HOSPITAL_COMMUNITY)
Admission: RE | Admit: 2011-04-04 | Discharge: 2011-04-04 | Disposition: A | Discharge status: Home / Self Care | Payer: Self-pay | Source: Ambulatory Visit | Attending: Internal Medicine | Admitting: Internal Medicine

## 2011-04-08 ENCOUNTER — Encounter (HOSPITAL_COMMUNITY): Payer: Self-pay

## 2011-04-08 ENCOUNTER — Encounter (HOSPITAL_COMMUNITY)
Admission: RE | Admit: 2011-04-08 | Discharge: 2011-04-08 | Disposition: A | Discharge status: Home / Self Care | Payer: Self-pay | Source: Ambulatory Visit | Attending: Internal Medicine | Admitting: Internal Medicine

## 2011-04-08 DIAGNOSIS — Q2111 Secundum atrial septal defect: Secondary | ICD-10-CM | POA: Insufficient documentation

## 2011-04-08 DIAGNOSIS — Z5189 Encounter for other specified aftercare: Secondary | ICD-10-CM | POA: Insufficient documentation

## 2011-04-08 DIAGNOSIS — Z9889 Other specified postprocedural states: Secondary | ICD-10-CM | POA: Insufficient documentation

## 2011-04-08 DIAGNOSIS — I440 Atrioventricular block, first degree: Secondary | ICD-10-CM | POA: Insufficient documentation

## 2011-04-08 DIAGNOSIS — I509 Heart failure, unspecified: Secondary | ICD-10-CM | POA: Insufficient documentation

## 2011-04-08 DIAGNOSIS — I472 Ventricular tachycardia, unspecified: Secondary | ICD-10-CM | POA: Insufficient documentation

## 2011-04-08 DIAGNOSIS — I2789 Other specified pulmonary heart diseases: Secondary | ICD-10-CM | POA: Insufficient documentation

## 2011-04-08 DIAGNOSIS — I442 Atrioventricular block, complete: Secondary | ICD-10-CM | POA: Insufficient documentation

## 2011-04-08 DIAGNOSIS — I10 Essential (primary) hypertension: Secondary | ICD-10-CM | POA: Insufficient documentation

## 2011-04-08 DIAGNOSIS — E669 Obesity, unspecified: Secondary | ICD-10-CM | POA: Insufficient documentation

## 2011-04-08 DIAGNOSIS — I428 Other cardiomyopathies: Secondary | ICD-10-CM | POA: Insufficient documentation

## 2011-04-08 DIAGNOSIS — I498 Other specified cardiac arrhythmias: Secondary | ICD-10-CM | POA: Insufficient documentation

## 2011-04-08 DIAGNOSIS — Z95810 Presence of automatic (implantable) cardiac defibrillator: Secondary | ICD-10-CM | POA: Insufficient documentation

## 2011-04-08 DIAGNOSIS — E119 Type 2 diabetes mellitus without complications: Secondary | ICD-10-CM | POA: Insufficient documentation

## 2011-04-08 DIAGNOSIS — I4729 Other ventricular tachycardia: Secondary | ICD-10-CM | POA: Insufficient documentation

## 2011-04-08 DIAGNOSIS — Q211 Atrial septal defect: Secondary | ICD-10-CM | POA: Insufficient documentation

## 2011-04-08 DIAGNOSIS — Z8249 Family history of ischemic heart disease and other diseases of the circulatory system: Secondary | ICD-10-CM | POA: Insufficient documentation

## 2011-04-08 DIAGNOSIS — Z7901 Long term (current) use of anticoagulants: Secondary | ICD-10-CM | POA: Insufficient documentation

## 2011-04-09 ENCOUNTER — Encounter (HOSPITAL_COMMUNITY): Payer: Self-pay

## 2011-04-09 ENCOUNTER — Encounter (HOSPITAL_COMMUNITY)
Admission: RE | Admit: 2011-04-09 | Discharge: 2011-04-09 | Disposition: A | Discharge status: Home / Self Care | Payer: Self-pay | Source: Ambulatory Visit | Attending: Internal Medicine | Admitting: Internal Medicine

## 2011-04-11 ENCOUNTER — Encounter (HOSPITAL_COMMUNITY)
Admission: RE | Admit: 2011-04-11 | Discharge: 2011-04-11 | Disposition: A | Discharge status: Home / Self Care | Payer: Self-pay | Source: Ambulatory Visit | Attending: Internal Medicine | Admitting: Internal Medicine

## 2011-04-11 ENCOUNTER — Encounter (HOSPITAL_COMMUNITY): Payer: Self-pay

## 2011-04-11 ENCOUNTER — Encounter: Payer: Self-pay | Admitting: *Deleted

## 2011-04-15 ENCOUNTER — Encounter (HOSPITAL_COMMUNITY): Payer: Self-pay

## 2011-04-15 ENCOUNTER — Encounter (HOSPITAL_COMMUNITY)
Admission: RE | Admit: 2011-04-15 | Discharge: 2011-04-15 | Disposition: A | Discharge status: Home / Self Care | Payer: Self-pay | Source: Ambulatory Visit | Attending: Internal Medicine | Admitting: Internal Medicine

## 2011-04-16 ENCOUNTER — Encounter (HOSPITAL_COMMUNITY)
Admission: RE | Admit: 2011-04-16 | Discharge: 2011-04-16 | Disposition: A | Discharge status: Home / Self Care | Payer: Self-pay | Source: Ambulatory Visit | Attending: Internal Medicine | Admitting: Internal Medicine

## 2011-04-16 ENCOUNTER — Encounter (HOSPITAL_COMMUNITY): Payer: Self-pay

## 2011-04-18 ENCOUNTER — Encounter (HOSPITAL_COMMUNITY)
Admission: RE | Admit: 2011-04-18 | Discharge: 2011-04-18 | Disposition: A | Discharge status: Home / Self Care | Payer: Self-pay | Source: Ambulatory Visit | Attending: Internal Medicine | Admitting: Internal Medicine

## 2011-04-18 ENCOUNTER — Encounter (HOSPITAL_COMMUNITY): Payer: Self-pay

## 2011-04-22 ENCOUNTER — Encounter (HOSPITAL_COMMUNITY)
Admission: RE | Admit: 2011-04-22 | Discharge: 2011-04-22 | Disposition: A | Discharge status: Home / Self Care | Payer: Self-pay | Source: Ambulatory Visit | Attending: Internal Medicine | Admitting: Internal Medicine

## 2011-04-22 ENCOUNTER — Encounter (HOSPITAL_COMMUNITY): Payer: Self-pay

## 2011-04-23 ENCOUNTER — Encounter (HOSPITAL_COMMUNITY): Payer: Self-pay

## 2011-04-23 ENCOUNTER — Encounter (HOSPITAL_COMMUNITY)
Admission: RE | Admit: 2011-04-23 | Discharge: 2011-04-23 | Disposition: A | Discharge status: Home / Self Care | Payer: Self-pay | Source: Ambulatory Visit | Attending: Internal Medicine | Admitting: Internal Medicine

## 2011-04-25 ENCOUNTER — Encounter (HOSPITAL_COMMUNITY): Payer: Self-pay

## 2011-04-25 ENCOUNTER — Encounter (HOSPITAL_COMMUNITY)
Admission: RE | Admit: 2011-04-25 | Discharge: 2011-04-25 | Disposition: A | Discharge status: Home / Self Care | Payer: Self-pay | Source: Ambulatory Visit | Attending: Internal Medicine | Admitting: Internal Medicine

## 2011-04-29 ENCOUNTER — Encounter (HOSPITAL_COMMUNITY): Payer: Self-pay

## 2011-04-30 ENCOUNTER — Encounter (HOSPITAL_COMMUNITY): Payer: Self-pay

## 2011-05-02 ENCOUNTER — Encounter (HOSPITAL_COMMUNITY): Payer: Self-pay

## 2011-05-06 ENCOUNTER — Encounter (HOSPITAL_COMMUNITY): Payer: Self-pay

## 2011-05-07 ENCOUNTER — Encounter (HOSPITAL_COMMUNITY): Payer: Self-pay

## 2011-05-09 ENCOUNTER — Encounter (HOSPITAL_COMMUNITY): Payer: Self-pay

## 2011-05-09 ENCOUNTER — Encounter (HOSPITAL_COMMUNITY)
Admission: RE | Admit: 2011-05-09 | Discharge: 2011-05-09 | Disposition: A | Discharge status: Home / Self Care | Payer: Self-pay | Source: Ambulatory Visit | Attending: Internal Medicine | Admitting: Internal Medicine

## 2011-05-09 DIAGNOSIS — I4729 Other ventricular tachycardia: Secondary | ICD-10-CM | POA: Insufficient documentation

## 2011-05-09 DIAGNOSIS — I498 Other specified cardiac arrhythmias: Secondary | ICD-10-CM | POA: Insufficient documentation

## 2011-05-09 DIAGNOSIS — Q2111 Secundum atrial septal defect: Secondary | ICD-10-CM | POA: Insufficient documentation

## 2011-05-09 DIAGNOSIS — E119 Type 2 diabetes mellitus without complications: Secondary | ICD-10-CM | POA: Insufficient documentation

## 2011-05-09 DIAGNOSIS — I442 Atrioventricular block, complete: Secondary | ICD-10-CM | POA: Insufficient documentation

## 2011-05-09 DIAGNOSIS — I440 Atrioventricular block, first degree: Secondary | ICD-10-CM | POA: Insufficient documentation

## 2011-05-09 DIAGNOSIS — Q211 Atrial septal defect: Secondary | ICD-10-CM | POA: Insufficient documentation

## 2011-05-09 DIAGNOSIS — I509 Heart failure, unspecified: Secondary | ICD-10-CM | POA: Insufficient documentation

## 2011-05-09 DIAGNOSIS — Z9889 Other specified postprocedural states: Secondary | ICD-10-CM | POA: Insufficient documentation

## 2011-05-09 DIAGNOSIS — Z7901 Long term (current) use of anticoagulants: Secondary | ICD-10-CM | POA: Insufficient documentation

## 2011-05-09 DIAGNOSIS — E669 Obesity, unspecified: Secondary | ICD-10-CM | POA: Insufficient documentation

## 2011-05-09 DIAGNOSIS — Z95810 Presence of automatic (implantable) cardiac defibrillator: Secondary | ICD-10-CM | POA: Insufficient documentation

## 2011-05-09 DIAGNOSIS — Z5189 Encounter for other specified aftercare: Secondary | ICD-10-CM | POA: Insufficient documentation

## 2011-05-09 DIAGNOSIS — I428 Other cardiomyopathies: Secondary | ICD-10-CM | POA: Insufficient documentation

## 2011-05-09 DIAGNOSIS — Z8249 Family history of ischemic heart disease and other diseases of the circulatory system: Secondary | ICD-10-CM | POA: Insufficient documentation

## 2011-05-09 DIAGNOSIS — I472 Ventricular tachycardia, unspecified: Secondary | ICD-10-CM | POA: Insufficient documentation

## 2011-05-09 DIAGNOSIS — I2789 Other specified pulmonary heart diseases: Secondary | ICD-10-CM | POA: Insufficient documentation

## 2011-05-09 DIAGNOSIS — I10 Essential (primary) hypertension: Secondary | ICD-10-CM | POA: Insufficient documentation

## 2011-05-13 ENCOUNTER — Encounter (HOSPITAL_COMMUNITY)
Admission: RE | Admit: 2011-05-13 | Discharge: 2011-05-13 | Disposition: A | Discharge status: Home / Self Care | Payer: Self-pay | Source: Ambulatory Visit | Attending: Internal Medicine | Admitting: Internal Medicine

## 2011-05-13 ENCOUNTER — Encounter (HOSPITAL_COMMUNITY): Payer: Self-pay

## 2011-05-14 ENCOUNTER — Encounter (HOSPITAL_COMMUNITY)
Admission: RE | Admit: 2011-05-14 | Discharge: 2011-05-14 | Disposition: A | Discharge status: Home / Self Care | Payer: Self-pay | Source: Ambulatory Visit | Attending: Internal Medicine | Admitting: Internal Medicine

## 2011-05-14 ENCOUNTER — Encounter (HOSPITAL_COMMUNITY): Payer: Self-pay

## 2011-05-16 ENCOUNTER — Encounter (HOSPITAL_COMMUNITY): Payer: Self-pay

## 2011-05-20 ENCOUNTER — Encounter (HOSPITAL_COMMUNITY): Payer: Self-pay

## 2011-05-21 ENCOUNTER — Encounter (HOSPITAL_COMMUNITY): Payer: Self-pay

## 2011-05-23 ENCOUNTER — Encounter (HOSPITAL_COMMUNITY): Payer: Self-pay

## 2011-05-27 ENCOUNTER — Encounter (HOSPITAL_COMMUNITY): Payer: Self-pay

## 2011-05-28 ENCOUNTER — Encounter (HOSPITAL_COMMUNITY): Payer: Self-pay

## 2011-05-30 ENCOUNTER — Encounter (HOSPITAL_COMMUNITY): Payer: Self-pay

## 2011-06-03 ENCOUNTER — Encounter (HOSPITAL_COMMUNITY): Payer: Self-pay

## 2011-06-04 ENCOUNTER — Encounter (HOSPITAL_COMMUNITY)
Admission: RE | Admit: 2011-06-04 | Discharge: 2011-06-04 | Disposition: A | Discharge status: Home / Self Care | Payer: Self-pay | Source: Ambulatory Visit | Attending: Internal Medicine | Admitting: Internal Medicine

## 2011-06-06 ENCOUNTER — Encounter (HOSPITAL_COMMUNITY)
Admission: RE | Admit: 2011-06-06 | Discharge: 2011-06-06 | Disposition: A | Discharge status: Home / Self Care | Payer: Self-pay | Source: Ambulatory Visit | Attending: Internal Medicine | Admitting: Internal Medicine

## 2011-06-09 ENCOUNTER — Other Ambulatory Visit: Payer: Self-pay | Admitting: Internal Medicine

## 2011-06-10 ENCOUNTER — Encounter (HOSPITAL_COMMUNITY)
Admission: RE | Admit: 2011-06-10 | Discharge: 2011-06-10 | Disposition: A | Discharge status: Home / Self Care | Payer: Self-pay | Source: Ambulatory Visit | Attending: Internal Medicine | Admitting: Internal Medicine

## 2011-06-10 ENCOUNTER — Other Ambulatory Visit: Payer: Self-pay

## 2011-06-10 DIAGNOSIS — I10 Essential (primary) hypertension: Secondary | ICD-10-CM | POA: Insufficient documentation

## 2011-06-10 DIAGNOSIS — I440 Atrioventricular block, first degree: Secondary | ICD-10-CM | POA: Insufficient documentation

## 2011-06-10 DIAGNOSIS — Q211 Atrial septal defect: Secondary | ICD-10-CM | POA: Insufficient documentation

## 2011-06-10 DIAGNOSIS — I2789 Other specified pulmonary heart diseases: Secondary | ICD-10-CM | POA: Insufficient documentation

## 2011-06-10 DIAGNOSIS — I498 Other specified cardiac arrhythmias: Secondary | ICD-10-CM | POA: Insufficient documentation

## 2011-06-10 DIAGNOSIS — Z95810 Presence of automatic (implantable) cardiac defibrillator: Secondary | ICD-10-CM | POA: Insufficient documentation

## 2011-06-10 DIAGNOSIS — I428 Other cardiomyopathies: Secondary | ICD-10-CM | POA: Insufficient documentation

## 2011-06-10 DIAGNOSIS — E119 Type 2 diabetes mellitus without complications: Secondary | ICD-10-CM | POA: Insufficient documentation

## 2011-06-10 DIAGNOSIS — Z5189 Encounter for other specified aftercare: Secondary | ICD-10-CM | POA: Insufficient documentation

## 2011-06-10 DIAGNOSIS — I442 Atrioventricular block, complete: Secondary | ICD-10-CM | POA: Insufficient documentation

## 2011-06-10 DIAGNOSIS — I509 Heart failure, unspecified: Secondary | ICD-10-CM | POA: Insufficient documentation

## 2011-06-10 DIAGNOSIS — I472 Ventricular tachycardia, unspecified: Secondary | ICD-10-CM | POA: Insufficient documentation

## 2011-06-10 DIAGNOSIS — Z8249 Family history of ischemic heart disease and other diseases of the circulatory system: Secondary | ICD-10-CM | POA: Insufficient documentation

## 2011-06-10 DIAGNOSIS — E669 Obesity, unspecified: Secondary | ICD-10-CM | POA: Insufficient documentation

## 2011-06-10 DIAGNOSIS — Z9889 Other specified postprocedural states: Secondary | ICD-10-CM | POA: Insufficient documentation

## 2011-06-10 DIAGNOSIS — Q2111 Secundum atrial septal defect: Secondary | ICD-10-CM | POA: Insufficient documentation

## 2011-06-10 DIAGNOSIS — I4729 Other ventricular tachycardia: Secondary | ICD-10-CM | POA: Insufficient documentation

## 2011-06-10 DIAGNOSIS — Z7901 Long term (current) use of anticoagulants: Secondary | ICD-10-CM | POA: Insufficient documentation

## 2011-06-10 MED ORDER — CARVEDILOL 6.25 MG PO TABS: 6.2500 mg | ORAL_TABLET | Freq: Two times a day (BID) | ORAL | Status: DC

## 2011-06-11 ENCOUNTER — Encounter (HOSPITAL_COMMUNITY)
Admission: RE | Admit: 2011-06-11 | Discharge: 2011-06-11 | Disposition: A | Discharge status: Home / Self Care | Payer: Self-pay | Source: Ambulatory Visit | Attending: Internal Medicine | Admitting: Internal Medicine

## 2011-06-13 ENCOUNTER — Encounter (HOSPITAL_COMMUNITY)
Admission: RE | Admit: 2011-06-13 | Discharge: 2011-06-13 | Disposition: A | Discharge status: Home / Self Care | Payer: Self-pay | Source: Ambulatory Visit | Attending: Internal Medicine | Admitting: Internal Medicine

## 2011-06-17 ENCOUNTER — Encounter (HOSPITAL_COMMUNITY)
Admission: RE | Admit: 2011-06-17 | Discharge: 2011-06-17 | Disposition: A | Discharge status: Home / Self Care | Payer: Self-pay | Source: Ambulatory Visit | Attending: Internal Medicine | Admitting: Internal Medicine

## 2011-06-18 ENCOUNTER — Encounter (HOSPITAL_COMMUNITY)
Admission: RE | Admit: 2011-06-18 | Discharge: 2011-06-18 | Disposition: A | Discharge status: Home / Self Care | Payer: Self-pay | Source: Ambulatory Visit | Attending: Internal Medicine | Admitting: Internal Medicine

## 2011-06-20 ENCOUNTER — Encounter (HOSPITAL_COMMUNITY)
Admission: RE | Admit: 2011-06-20 | Discharge: 2011-06-20 | Disposition: A | Discharge status: Home / Self Care | Payer: Self-pay | Source: Ambulatory Visit | Attending: Internal Medicine | Admitting: Internal Medicine

## 2011-06-24 ENCOUNTER — Encounter (HOSPITAL_COMMUNITY)
Admission: RE | Admit: 2011-06-24 | Discharge: 2011-06-24 | Disposition: A | Discharge status: Home / Self Care | Payer: Self-pay | Source: Ambulatory Visit | Attending: Internal Medicine | Admitting: Internal Medicine

## 2011-06-25 ENCOUNTER — Encounter (HOSPITAL_COMMUNITY)
Admission: RE | Admit: 2011-06-25 | Discharge: 2011-06-25 | Disposition: A | Discharge status: Home / Self Care | Payer: Self-pay | Source: Ambulatory Visit | Attending: Internal Medicine | Admitting: Internal Medicine

## 2011-06-27 ENCOUNTER — Encounter: Payer: Self-pay | Admitting: Internal Medicine

## 2011-06-27 ENCOUNTER — Ambulatory Visit (INDEPENDENT_AMBULATORY_CARE_PROVIDER_SITE_OTHER): Payer: No Typology Code available for payment source | Admitting: *Deleted

## 2011-06-27 ENCOUNTER — Encounter (HOSPITAL_COMMUNITY)
Admission: RE | Admit: 2011-06-27 | Discharge: 2011-06-27 | Disposition: A | Discharge status: Home / Self Care | Payer: Self-pay | Source: Ambulatory Visit | Attending: Internal Medicine | Admitting: Internal Medicine

## 2011-06-27 DIAGNOSIS — I4891 Unspecified atrial fibrillation: Secondary | ICD-10-CM

## 2011-06-27 DIAGNOSIS — I429 Cardiomyopathy, unspecified: Secondary | ICD-10-CM

## 2011-06-27 DIAGNOSIS — Z95810 Presence of automatic (implantable) cardiac defibrillator: Secondary | ICD-10-CM

## 2011-06-27 LAB — REMOTE ICD DEVICE
DEVICE MODEL ICD: 623851
HV IMPEDENCE: 47 Ohm
RV LEAD AMPLITUDE: 10 mv
TZAT-0004FASTVT: 8
TZAT-0004SLOWVT: 8
TZAT-0012SLOWVT: 200 ms
TZAT-0013SLOWVT: 3
TZAT-0018FASTVT: NEGATIVE
TZAT-0018SLOWVT: NEGATIVE
TZAT-0019FASTVT: 7.5 V
TZON-0003FASTVT: 300 ms
TZON-0003SLOWVT: 375 ms
TZON-0004FASTVT: 16
TZON-0010FASTVT: 40 ms
TZST-0001FASTVT: 3
TZST-0001FASTVT: 4
TZST-0001SLOWVT: 3
TZST-0001SLOWVT: 5
TZST-0003FASTVT: 36 J
TZST-0003FASTVT: 40 J
TZST-0003SLOWVT: 30 J
VENTRICULAR PACING ICD: 80 pct

## 2011-07-01 ENCOUNTER — Encounter (HOSPITAL_COMMUNITY)
Admission: RE | Admit: 2011-07-01 | Discharge: 2011-07-01 | Disposition: A | Discharge status: Home / Self Care | Payer: Self-pay | Source: Ambulatory Visit | Attending: Internal Medicine | Admitting: Internal Medicine

## 2011-07-02 ENCOUNTER — Encounter (HOSPITAL_COMMUNITY)
Admission: RE | Admit: 2011-07-02 | Discharge: 2011-07-02 | Disposition: A | Discharge status: Home / Self Care | Payer: Self-pay | Source: Ambulatory Visit | Attending: Internal Medicine | Admitting: Internal Medicine

## 2011-07-04 ENCOUNTER — Encounter (HOSPITAL_COMMUNITY)
Admission: RE | Admit: 2011-07-04 | Discharge: 2011-07-04 | Disposition: A | Discharge status: Home / Self Care | Payer: Self-pay | Source: Ambulatory Visit | Attending: Internal Medicine | Admitting: Internal Medicine

## 2011-07-04 NOTE — Progress Notes (Signed)
Remote icd check  

## 2011-07-08 ENCOUNTER — Encounter (HOSPITAL_COMMUNITY)
Admission: RE | Admit: 2011-07-08 | Discharge: 2011-07-08 | Disposition: A | Discharge status: Home / Self Care | Payer: Self-pay | Source: Ambulatory Visit | Attending: Internal Medicine | Admitting: Internal Medicine

## 2011-07-08 DIAGNOSIS — I440 Atrioventricular block, first degree: Secondary | ICD-10-CM | POA: Insufficient documentation

## 2011-07-08 DIAGNOSIS — I442 Atrioventricular block, complete: Secondary | ICD-10-CM | POA: Insufficient documentation

## 2011-07-08 DIAGNOSIS — Z8249 Family history of ischemic heart disease and other diseases of the circulatory system: Secondary | ICD-10-CM | POA: Insufficient documentation

## 2011-07-08 DIAGNOSIS — I4729 Other ventricular tachycardia: Secondary | ICD-10-CM | POA: Insufficient documentation

## 2011-07-08 DIAGNOSIS — I472 Ventricular tachycardia, unspecified: Secondary | ICD-10-CM | POA: Insufficient documentation

## 2011-07-08 DIAGNOSIS — Z9889 Other specified postprocedural states: Secondary | ICD-10-CM | POA: Insufficient documentation

## 2011-07-08 DIAGNOSIS — I428 Other cardiomyopathies: Secondary | ICD-10-CM | POA: Insufficient documentation

## 2011-07-08 DIAGNOSIS — I498 Other specified cardiac arrhythmias: Secondary | ICD-10-CM | POA: Insufficient documentation

## 2011-07-08 DIAGNOSIS — E119 Type 2 diabetes mellitus without complications: Secondary | ICD-10-CM | POA: Insufficient documentation

## 2011-07-08 DIAGNOSIS — Q2111 Secundum atrial septal defect: Secondary | ICD-10-CM | POA: Insufficient documentation

## 2011-07-08 DIAGNOSIS — Z7901 Long term (current) use of anticoagulants: Secondary | ICD-10-CM | POA: Insufficient documentation

## 2011-07-08 DIAGNOSIS — I10 Essential (primary) hypertension: Secondary | ICD-10-CM | POA: Insufficient documentation

## 2011-07-08 DIAGNOSIS — E669 Obesity, unspecified: Secondary | ICD-10-CM | POA: Insufficient documentation

## 2011-07-08 DIAGNOSIS — I2789 Other specified pulmonary heart diseases: Secondary | ICD-10-CM | POA: Insufficient documentation

## 2011-07-08 DIAGNOSIS — Q211 Atrial septal defect: Secondary | ICD-10-CM | POA: Insufficient documentation

## 2011-07-08 DIAGNOSIS — I509 Heart failure, unspecified: Secondary | ICD-10-CM | POA: Insufficient documentation

## 2011-07-08 DIAGNOSIS — Z95810 Presence of automatic (implantable) cardiac defibrillator: Secondary | ICD-10-CM | POA: Insufficient documentation

## 2011-07-08 DIAGNOSIS — Z5189 Encounter for other specified aftercare: Secondary | ICD-10-CM | POA: Insufficient documentation

## 2011-07-09 ENCOUNTER — Encounter (HOSPITAL_COMMUNITY)
Admission: RE | Admit: 2011-07-09 | Discharge: 2011-07-09 | Disposition: A | Discharge status: Home / Self Care | Payer: Self-pay | Source: Ambulatory Visit | Attending: Internal Medicine | Admitting: Internal Medicine

## 2011-07-11 ENCOUNTER — Encounter (HOSPITAL_COMMUNITY)
Admission: RE | Admit: 2011-07-11 | Discharge: 2011-07-11 | Disposition: A | Discharge status: Home / Self Care | Payer: Self-pay | Source: Ambulatory Visit | Attending: Internal Medicine | Admitting: Internal Medicine

## 2011-07-15 ENCOUNTER — Encounter (HOSPITAL_COMMUNITY)
Admission: RE | Admit: 2011-07-15 | Discharge: 2011-07-15 | Disposition: A | Discharge status: Home / Self Care | Payer: Self-pay | Source: Ambulatory Visit | Attending: Internal Medicine | Admitting: Internal Medicine

## 2011-07-16 ENCOUNTER — Encounter (HOSPITAL_COMMUNITY)
Admission: RE | Admit: 2011-07-16 | Discharge: 2011-07-16 | Disposition: A | Discharge status: Home / Self Care | Payer: Self-pay | Source: Ambulatory Visit | Attending: Internal Medicine | Admitting: Internal Medicine

## 2011-07-18 ENCOUNTER — Encounter (HOSPITAL_COMMUNITY)
Admission: RE | Admit: 2011-07-18 | Discharge: 2011-07-18 | Disposition: A | Discharge status: Home / Self Care | Payer: Self-pay | Source: Ambulatory Visit | Attending: Internal Medicine | Admitting: Internal Medicine

## 2011-07-22 ENCOUNTER — Encounter (HOSPITAL_COMMUNITY): Payer: Self-pay

## 2011-07-23 ENCOUNTER — Encounter (HOSPITAL_COMMUNITY)
Admission: RE | Admit: 2011-07-23 | Discharge: 2011-07-23 | Disposition: A | Discharge status: Home / Self Care | Payer: Self-pay | Source: Ambulatory Visit | Attending: Internal Medicine | Admitting: Internal Medicine

## 2011-07-25 ENCOUNTER — Encounter (HOSPITAL_COMMUNITY)
Admission: RE | Admit: 2011-07-25 | Discharge: 2011-07-25 | Disposition: A | Discharge status: Home / Self Care | Payer: Self-pay | Source: Ambulatory Visit | Attending: Internal Medicine | Admitting: Internal Medicine

## 2011-07-29 ENCOUNTER — Encounter (HOSPITAL_COMMUNITY)
Admission: RE | Admit: 2011-07-29 | Discharge: 2011-07-29 | Disposition: A | Discharge status: Home / Self Care | Payer: Self-pay | Source: Ambulatory Visit | Attending: Internal Medicine | Admitting: Internal Medicine

## 2011-07-30 ENCOUNTER — Encounter (HOSPITAL_COMMUNITY)
Admission: RE | Admit: 2011-07-30 | Discharge: 2011-07-30 | Disposition: A | Discharge status: Home / Self Care | Payer: Self-pay | Source: Ambulatory Visit | Attending: Internal Medicine | Admitting: Internal Medicine

## 2011-08-01 ENCOUNTER — Encounter (HOSPITAL_COMMUNITY)
Admission: RE | Admit: 2011-08-01 | Discharge: 2011-08-01 | Disposition: A | Discharge status: Home / Self Care | Payer: Self-pay | Source: Ambulatory Visit | Attending: Internal Medicine | Admitting: Internal Medicine

## 2011-08-02 ENCOUNTER — Emergency Department (HOSPITAL_COMMUNITY): Payer: No Typology Code available for payment source

## 2011-08-02 ENCOUNTER — Encounter (HOSPITAL_COMMUNITY): Payer: Self-pay | Admitting: *Deleted

## 2011-08-02 ENCOUNTER — Emergency Department (HOSPITAL_COMMUNITY)
Admission: EM | Admit: 2011-08-02 | Discharge: 2011-08-02 | Disposition: A | Discharge status: Home Health | Payer: No Typology Code available for payment source | Attending: Emergency Medicine | Admitting: Emergency Medicine

## 2011-08-02 DIAGNOSIS — Y9229 Other specified public building as the place of occurrence of the external cause: Secondary | ICD-10-CM | POA: Insufficient documentation

## 2011-08-02 DIAGNOSIS — S1093XA Contusion of unspecified part of neck, initial encounter: Secondary | ICD-10-CM | POA: Insufficient documentation

## 2011-08-02 DIAGNOSIS — I10 Essential (primary) hypertension: Secondary | ICD-10-CM | POA: Insufficient documentation

## 2011-08-02 DIAGNOSIS — M542 Cervicalgia: Secondary | ICD-10-CM | POA: Insufficient documentation

## 2011-08-02 DIAGNOSIS — I509 Heart failure, unspecified: Secondary | ICD-10-CM | POA: Insufficient documentation

## 2011-08-02 DIAGNOSIS — S0083XA Contusion of other part of head, initial encounter: Secondary | ICD-10-CM

## 2011-08-02 DIAGNOSIS — M5030 Other cervical disc degeneration, unspecified cervical region: Secondary | ICD-10-CM | POA: Insufficient documentation

## 2011-08-02 DIAGNOSIS — Z95810 Presence of automatic (implantable) cardiac defibrillator: Secondary | ICD-10-CM | POA: Insufficient documentation

## 2011-08-02 DIAGNOSIS — W19XXXA Unspecified fall, initial encounter: Secondary | ICD-10-CM

## 2011-08-02 DIAGNOSIS — S0003XA Contusion of scalp, initial encounter: Secondary | ICD-10-CM | POA: Insufficient documentation

## 2011-08-02 DIAGNOSIS — W101XXA Fall (on)(from) sidewalk curb, initial encounter: Secondary | ICD-10-CM | POA: Insufficient documentation

## 2011-08-02 DIAGNOSIS — G319 Degenerative disease of nervous system, unspecified: Secondary | ICD-10-CM | POA: Insufficient documentation

## 2011-08-02 DIAGNOSIS — Z79899 Other long term (current) drug therapy: Secondary | ICD-10-CM | POA: Insufficient documentation

## 2011-08-02 MED ORDER — TRAMADOL HCL 50 MG PO TABS: 50.0000 mg | ORAL_TABLET | Freq: Four times a day (QID) | ORAL | Status: AC | PRN

## 2011-08-02 MED ORDER — OXYCODONE-ACETAMINOPHEN 5-325 MG PO TABS
1.0000 | ORAL_TABLET | Freq: Once | ORAL | Status: AC
  Administered 2011-08-02: 1 via ORAL
  Filled 2011-08-02: qty 1

## 2011-08-02 NOTE — ED Notes (Signed)
Pt states that he tripped and fell.  Pt has band aid on his nose and dressing on his forehead.  Pt reports laceration from glasses on his nose and abrasion to forehead.  No LOC.  Pt is alert and oriented  GCS 15 on arrival.  No neck pain

## 2011-08-02 NOTE — ED Provider Notes (Signed)
Medical screening examination/treatment/procedure(s) were performed by non-physician practitioner and as supervising physician I was immediately available for consultation/collaboration.  Lydia Meng R. Kassadee Carawan, MD 08/02/11 2331 

## 2011-08-02 NOTE — Discharge Instructions (Signed)
Facial and Scalp Contusions You have a contusion (bruise) on your face or scalp. Injuries around the face and head generally cause a lot of swelling, especially around the eyes. This is because the blood supply to this area is good and tissues are loose. Swelling from a contusion is usually better in 2-3 days. It may take a week or longer for a "black eye" to clear up completely. HOME CARE INSTRUCTIONS   Apply ice packs to the injured area for about 15 to 20 minutes, 3 to 4 times a day, for the first couple days. This helps keep swelling down.   Use mild pain medicine as needed or instructed by your caregiver.   You may have a mild headache, slight dizziness, nausea, and weakness for a few days. This usually clears up with bed rest and mild pain medications.   Contact your caregiver if you are concerned about facial defects or have any difficulty with your bite or develop pain with chewing.  SEEK IMMEDIATE MEDICAL CARE IF:  You develop severe pain or a headache, unrelieved by medication.   You develop unusual sleepiness, confusion, personality changes, or vomiting.   You have a persistent nosebleed, double or blurred vision, or drainage from the nose or ear.   You have difficulty walking or using your arms or legs.  MAKE SURE YOU:   Understand these instructions.   Will watch your condition.   Will get help right away if you are not doing well or get worse.  Document Released: 05/30/2004 Document Revised: 04/11/2011 Document Reviewed: 03/08/2011 [facility] Patient Information 2012 [place], [place].Facial and Scalp Contusions You have a contusion (bruise) on your face or scalp. Injuries around the face and head generally cause a lot of swelling, especially around the eyes. This is because the blood supply to this area is good and tissues are loose. Swelling from a contusion is usually better in 2-3 days. It may take a week or longer for a "black eye" to clear up completely. HOME CARE  INSTRUCTIONS   Apply ice packs to the injured area for about 15 to 20 minutes, 3 to 4 times a day, for the first couple days. This helps keep swelling down.   Use mild pain medicine as needed or instructed by your caregiver.   You may have a mild headache, slight dizziness, nausea, and weakness for a few days. This usually clears up with bed rest and mild pain medications.   Contact your caregiver if you are concerned about facial defects or have any difficulty with your bite or develop pain with chewing.  SEEK IMMEDIATE MEDICAL CARE IF:  You develop severe pain or a headache, unrelieved by medication.   You develop unusual sleepiness, confusion, personality changes, or vomiting.   You have a persistent nosebleed, double or blurred vision, or drainage from the nose or ear.   You have difficulty walking or using your arms or legs.  MAKE SURE YOU:   Understand these instructions.   Will watch your condition.   Will get help right away if you are not doing well or get worse.  Document Released: 05/30/2004 Document Revised: 04/11/2011 Document Reviewed: 03/08/2011 [facility] Patient Information 2012 [place], [place].

## 2011-08-02 NOTE — ED Notes (Signed)
No dizziness with fall, ICD did not go off, mechanical fall

## 2011-08-02 NOTE — ED Provider Notes (Signed)
History     CSN: [id number]  Arrival date & time 08/02/11  1651   First MD Initiated Contact with Patient 08/02/11 1940      Chief Complaint  Patient presents with  . Fall    (Consider location/radiation/quality/duration/timing/severity/associated sxs/prior treatment) HPI  78 year old male with history of A. fib on cardiac defibrillator presents with chief complaint of fall. Patient states he was walking out from a nursery when he tripped over a curb and fall forward. Patient hits his head against the ground but denies loss of consciousness. He uses right hand to break the fall and noticed an abrasion to his right thumb. Patient denies any precipitating factors prior to fall. He denies neck pain, chest pain, shortness of breath, nausea, vomiting, diarrhea, abdominal pain, back pain. He denies any other injuries. He is not on any blood thinning medication.  Past Medical History  Diagnosis Date  . Automatic implantable cardiac defibrillator in situ     BiV pacer  . Secondary cardiomyopathy, unspecified     nicm ef 30-35%,nonob cad-cath 2006,echo 2/12:ef30-35%.mod lvh,inf hk,mild-mod lae and mild rae,pasp 35  . Congestive heart failure, unspecified   . Unspecified essential hypertension   . History of atrial fibrillation     Past Surgical History  Procedure Date  . Rt. shoulder   . Bilateral knee arthroscopy   . Colonoscopy with polypectomy   . Intraoperative transesophageal echocardiogram   . Median sterotomy for mitral valve repair   . Implantation of a dual chamber biventricular icd     Family History  Problem Relation Age of Onset  . Heart disease Neg Hx     Rheumatic or valvular heart disease   . Cardiomyopathy Neg Hx     History  Substance Use Topics  . Smoking status: Former Smoker    Quit date: 05/06/1961  . Smokeless tobacco: Not on file  . Alcohol Use: No      Review of Systems  All other systems reviewed and are negative.    Allergies  Review of  patient's allergies indicates no known allergies.  Home Medications   Current Outpatient Rx  Name Route Sig Dispense Refill  . ASPIRIN EC 325 MG PO TBEC Oral Take 325 mg by mouth daily.      Marland Kitchen CARVEDILOL 6.25 MG PO TABS Oral Take 1 tablet (6.25 mg total) by mouth 2 (two) times daily with a meal. 180 tablet 5  . FUROSEMIDE 40 MG PO TABS  Take one and one half tablets once daily 135 tablet 4  . NIACIN ER (ANTIHYPERLIPIDEMIC) 500 MG PO TBCR Oral Take 500 mg by mouth daily.      Marland Kitchen POTASSIUM CHLORIDE ER 10 MEQ PO TBCR Oral Take 10 mEq by mouth daily.    Marland Kitchen RAMIPRIL 2.5 MG PO CAPS Oral Take 2.5 mg by mouth 2 (two) times daily.      Marland Kitchen TAMSULOSIN HCL 0.4 MG PO CAPS Oral Take 0.4 mg by mouth at bedtime.        BP 137/88  Pulse 55  Temp(Src) 97.6 F (36.4 C) (Oral)  Resp 14  SpO2 92%  Physical Exam  Nursing note and vitals reviewed. Constitutional: He is oriented to person, place, and time. He appears well-developed and well-nourished. No distress.  HENT:  Head: Normocephalic.       Abrasion noted to left forehead, bridge of nose, and ecchymosis to the infraorbital region. No obvious deformity. No hemotympanum. No septal hematoma. No dental pain. No neck pain. No foreign  body seen or palpated.  Eyes: Conjunctivae and EOM are normal. Pupils are equal, round, and reactive to light.       No pain with eye movement.  Neck: Normal range of motion. Neck supple.       No midline cervical tenderness. No step-off. Full range of motion.  Cardiovascular: Normal rate and regular rhythm.        2 out 6 systolic murmur best heard at left chest.  Pulmonary/Chest: Effort normal and breath sounds normal. No respiratory distress. He exhibits no tenderness.  Abdominal: Soft. There is no tenderness.  Musculoskeletal: Normal range of motion.       Mild abrasion noted to lateral aspects of right thumb without obvious deformity. Normal range of motion to all joints and right hand, and right wrist. No right elbow  pain, or right shoulder pain.  Neurological: He is alert and oriented to person, place, and time. He has normal strength. He displays a negative Romberg sign. Coordination and gait normal. GCS eye subscore is 4. GCS verbal subscore is 5. GCS motor subscore is 6.  Skin: Skin is warm.  Psychiatric: He has a normal mood and affect.    ED Course  Procedures (including critical care time)  Labs Reviewed - No data to display No results found.   No diagnosis found.  Results for orders placed in visit on 06/27/11  REMOTE ICD DEVICE      Component Value Range   DEVICE MODEL ICD 191478     DEV-0014ICD Lewayne Bunting   M.D.     DEV-0020ICD Y     DEV-0014LDO Lewayne Bunting   M.D.     [id number] Lewayne Bunting   M.D.     [id number] Lewayne Bunting   M.D.     [id number] [employer] Jude Medical Monitoring System     [phone]       Value: Remote CRT-D device check. Thresholds and sensing consistent with previous device measurements. Lead impedance trends stable over time. 100% A-fib, - coumadin.   No ventricular arrhythmia episodes recorded. Patient bi-ventricularly pacing >80% of the      time. Device programmed with appropriate safety margins. Programmed DDT to allow for more BiV pacing but the % is consistant since last remote.  Next remote 09/26/11.   AL IMPEDENCE ICD 430     LV LEAD IMPEDENCE ICD 410     RV LEAD IMPEDENCE ICD 460     HV IMPEDENCE 47     VENTRICULAR PACING ICD 80     ATRIAL PACING ICD 1     RV LEAD AMPLITUDE 10     BRDY-0001RV DDT     BRDY-0002RV 60     BRDY-0004RV 120     BRDY-0005RV Off     BRDY-0010RV V.Safety=On     BAMS-0001 150     TZON-0002FASTVT ATP + 4 Shocks     TZON-0003FASTVT 300     TZON-0004FASTVT 16     TZON-0005FASTVT 6     TZON-0008FASTVT Off     TZON-0010FASTVT 40     TZAT-0001FASTVT 1     TZAT-0002FASTVT Y     TZAT-0003FASTVT Burst     TZAT-0004FASTVT 8     TZAT-0012FASTVT 200     TZAT-0013FASTVT 1     TZAT-0018FASTVT N     TZAT-0019FASTVT 7.5       TZAT-0020FASTVT 1.0     TZST-0001FASTVT 2     TZST-0002FASTVT Y     TZST-0003FASTVT 20.0     TZST-0004FASTVT Biphasic  TZST-0007FASTVT B > AX     TZST-0001FASTVT 3     TZST-0002FASTVT Y     TZST-0003FASTVT 36.0     TZST-0007FASTVT B > AX     TZST-0001FASTVT 4     TZST-0002FASTVT Y     TZST-0003FASTVT 40.0     TZST-0004FASTVT Biphasic     TZST-0007FASTVT B > AX     TZST-0001FASTVT 5     TZST-0002FASTVT Y     TZST-0003FASTVT 40.0     TZST-0004FASTVT Biphasic     TZST-0007FASTVT B > AX     TZON-0002SLOWVT ATP + 4 Shocks     TZON-0003SLOWVT 375     TZON-0004SLOWVT 24     TZON-0005SLOWVT 6     TZON-0008SLOWVT Off     TZON-0010SLOWVT 40     TZAT-0001SLOWVT 1     TZAT-0002SLOWVT Y     TZAT-0003SLOWVT Burst     TZAT-0004SLOWVT 8     TZAT-0012SLOWVT 200     TZAT-0013SLOWVT 3     TZAT-0018SLOWVT N     TZAT-0019SLOWVT 7.5     TZAT-0020SLOWVT 1.0     TZST-0001SLOWVT 2     TZST-0002SLOWVT Y     TZST-0003SLOWVT 20.0     TZST-0004SLOWVT Biphasic     TZST-0007SLOWVT B > AX     TZST-0001SLOWVT 3     TZST-0002SLOWVT Y     TZST-0003SLOWVT 30.0     TZST-0004SLOWVT Biphasic     TZST-0007SLOWVT B > AX     TZST-0001SLOWVT 4     TZST-0002SLOWVT Y     TZST-0003SLOWVT 40.0     TZST-0004SLOWVT Biphasic     TZST-0007SLOWVT B > AX     TZST-0001SLOWVT 5     TZST-0002SLOWVT Y     TZST-0004SLOWVT Biphasic     TZST-0007SLOWVT B > AX     Dg Cervical Spine Complete  08/02/2011  *RADIOLOGY REPORT*  Clinical Data: History of fall complaining of neck pain.  Facial trauma.  CERVICAL SPINE - COMPLETE 4+ VIEW  Comparison: No priors.  Findings: Multiple views of the cervical spine demonstrate no definite acute displaced fractures of the cervical spine. Alignment appears anatomic.  Prevertebral soft tissues are normal. Extensive multilevel degenerative disc disease is seen throughout the cervical spine, most severe at C2-C3 and C5-C6.  Multilevel facet arthropathy is also noted.  Median sternotomy  wires are incidentally noted.  IMPRESSION:  1.  No acute radiographic abnormality of the cervical spine. 2.  Severe multilevel degenerative disc disease and cervical spondylosis, as above.  Original Report Authenticated By: Florencia Reasons, M.D.   Ct Head Wo Contrast  08/02/2011  *RADIOLOGY REPORT*  Clinical Data:  History of trauma from a fall with laceration to nose and abrasion to the forehead.  CT HEAD WITHOUT CONTRAST CT MAXILLOFACIAL WITHOUT CONTRAST  Technique:  Multidetector CT imaging of the head and maxillofacial structures were performed using the standard protocol without intravenous contrast. Multiplanar CT image reconstructions of the maxillofacial structures were also generated.  Comparison:  CT head 06/16/2010.  CT HEAD  Findings: There is a small amount of swelling and high attenuation material in the subcutaneous tissues of the left frontal scalp, consistent with a contusion and scalp hematoma.  No underlying displaced skull fracture is identified.  No definite acute intracranial abnormality.  Specifically, no definite signs of acute post-traumatic intracranial hemorrhage, no focal mass, mass effect, hydrocephalus or abnormal intra or extra-axial fluid collections. A well-defined focus of decreased attenuation in the left caudate head is unchanged, compatible with  a lacunar infarction.  There is mild cerebral and cerebellar atrophy with associated ex vacuo dilatation of the ventricular system.  As well, there are patchy and confluent areas of decreased attenuation throughout the deep and periventricular white matter of the cerebral hemispheres bilaterally, compatible with chronic microvascular ischemic changes.  The visualized paranasal sinuses and mastoids are well pneumatized.  IMPRESSION: 1.  Small left frontal scalp hematoma without underlying displaced skull fracture or acute intracranial abnormality. 2.  Mild cerebral and cerebellar atrophy with chronic microvascular ischemic changes in  the white matter and old left caudate head lacunar infarction redemonstrated, as above.  CT MAXILLOFACIAL  Findings:   Postoperative changes in the lateral wall and floor of the left orbit are noted, presumably related to remote trauma. Today's study demonstrates no acute displaced facial bone fractures.  The mandible is intact.  Mandibular condyles are properly located.  The patient is edentulous.  Pterygoid plates are intact.  Small left frontal scalp hematoma with extension into the left periorbital region is again noted.  The globes and retro- orbital soft tissues are unremarkable bilaterally.  IMPRESSION: 1.  Small left frontal scalp hematoma without underlying displaced facial bone fracture or other acute abnormality.  2.  Postoperative changes in the lateral wall and floor of the left orbit, likely related to remote trauma.  Original Report Authenticated By: Florencia Reasons, M.D.   Ct Maxillofacial Wo Cm  08/02/2011  *RADIOLOGY REPORT*  Clinical Data:  History of trauma from a fall with laceration to nose and abrasion to the forehead.  CT HEAD WITHOUT CONTRAST CT MAXILLOFACIAL WITHOUT CONTRAST  Technique:  Multidetector CT imaging of the head and maxillofacial structures were performed using the standard protocol without intravenous contrast. Multiplanar CT image reconstructions of the maxillofacial structures were also generated.  Comparison:  CT head 06/16/2010.  CT HEAD  Findings: There is a small amount of swelling and high attenuation material in the subcutaneous tissues of the left frontal scalp, consistent with a contusion and scalp hematoma.  No underlying displaced skull fracture is identified.  No definite acute intracranial abnormality.  Specifically, no definite signs of acute post-traumatic intracranial hemorrhage, no focal mass, mass effect, hydrocephalus or abnormal intra or extra-axial fluid collections. A well-defined focus of decreased attenuation in the left caudate head is unchanged,  compatible with a lacunar infarction.  There is mild cerebral and cerebellar atrophy with associated ex vacuo dilatation of the ventricular system.  As well, there are patchy and confluent areas of decreased attenuation throughout the deep and periventricular white matter of the cerebral hemispheres bilaterally, compatible with chronic microvascular ischemic changes.  The visualized paranasal sinuses and mastoids are well pneumatized.  IMPRESSION: 1.  Small left frontal scalp hematoma without underlying displaced skull fracture or acute intracranial abnormality. 2.  Mild cerebral and cerebellar atrophy with chronic microvascular ischemic changes in the white matter and old left caudate head lacunar infarction redemonstrated, as above.  CT MAXILLOFACIAL  Findings:   Postoperative changes in the lateral wall and floor of the left orbit are noted, presumably related to remote trauma. Today's study demonstrates no acute displaced facial bone fractures.  The mandible is intact.  Mandibular condyles are properly located.  The patient is edentulous.  Pterygoid plates are intact.  Small left frontal scalp hematoma with extension into the left periorbital region is again noted.  The globes and retro- orbital soft tissues are unremarkable bilaterally.  IMPRESSION: 1.  Small left frontal scalp hematoma without underlying displaced facial bone fracture  or other acute abnormality.  2.  Postoperative changes in the lateral wall and floor of the left orbit, likely related to remote trauma.  Original Report Authenticated By: Florencia Reasons, M.D.      MDM  Patient had a mechanical fall in which he fell face first. He suffers mild abrasion to the face without evidence of laceration. Will obtain head CT, maxillofacial CT, and neck x-ray. Pain medication given. No presyncopal event. Patient is alert and oriented.   9:35 PM CT imaging of head and face shows no acute fracture or dislocation. X-ray of neck shows no acute  fracture or dislocation. Reassurance given. Will clean the wound appropriately. Patient is able to ambulate.     Fayrene Helper, PA-C 08/02/11 2136

## 2011-08-05 ENCOUNTER — Encounter (HOSPITAL_COMMUNITY): Payer: Self-pay

## 2011-08-05 ENCOUNTER — Encounter: Payer: Self-pay | Admitting: *Deleted

## 2011-08-05 DIAGNOSIS — Z5189 Encounter for other specified aftercare: Secondary | ICD-10-CM | POA: Insufficient documentation

## 2011-08-05 DIAGNOSIS — I472 Ventricular tachycardia, unspecified: Secondary | ICD-10-CM | POA: Insufficient documentation

## 2011-08-05 DIAGNOSIS — Z8249 Family history of ischemic heart disease and other diseases of the circulatory system: Secondary | ICD-10-CM | POA: Insufficient documentation

## 2011-08-05 DIAGNOSIS — E119 Type 2 diabetes mellitus without complications: Secondary | ICD-10-CM | POA: Insufficient documentation

## 2011-08-05 DIAGNOSIS — I4729 Other ventricular tachycardia: Secondary | ICD-10-CM | POA: Insufficient documentation

## 2011-08-05 DIAGNOSIS — Z95810 Presence of automatic (implantable) cardiac defibrillator: Secondary | ICD-10-CM | POA: Insufficient documentation

## 2011-08-05 DIAGNOSIS — I509 Heart failure, unspecified: Secondary | ICD-10-CM | POA: Insufficient documentation

## 2011-08-05 DIAGNOSIS — I428 Other cardiomyopathies: Secondary | ICD-10-CM | POA: Insufficient documentation

## 2011-08-05 DIAGNOSIS — Z9889 Other specified postprocedural states: Secondary | ICD-10-CM | POA: Insufficient documentation

## 2011-08-05 DIAGNOSIS — Z7901 Long term (current) use of anticoagulants: Secondary | ICD-10-CM | POA: Insufficient documentation

## 2011-08-05 DIAGNOSIS — Q211 Atrial septal defect: Secondary | ICD-10-CM | POA: Insufficient documentation

## 2011-08-05 DIAGNOSIS — I10 Essential (primary) hypertension: Secondary | ICD-10-CM | POA: Insufficient documentation

## 2011-08-05 DIAGNOSIS — I442 Atrioventricular block, complete: Secondary | ICD-10-CM | POA: Insufficient documentation

## 2011-08-05 DIAGNOSIS — Q2111 Secundum atrial septal defect: Secondary | ICD-10-CM | POA: Insufficient documentation

## 2011-08-05 DIAGNOSIS — I440 Atrioventricular block, first degree: Secondary | ICD-10-CM | POA: Insufficient documentation

## 2011-08-05 DIAGNOSIS — I2789 Other specified pulmonary heart diseases: Secondary | ICD-10-CM | POA: Insufficient documentation

## 2011-08-05 DIAGNOSIS — I498 Other specified cardiac arrhythmias: Secondary | ICD-10-CM | POA: Insufficient documentation

## 2011-08-05 DIAGNOSIS — E669 Obesity, unspecified: Secondary | ICD-10-CM | POA: Insufficient documentation

## 2011-08-06 ENCOUNTER — Encounter (HOSPITAL_COMMUNITY)
Admission: RE | Admit: 2011-08-06 | Discharge: 2011-08-06 | Disposition: A | Discharge status: Home / Self Care | Payer: Self-pay | Source: Ambulatory Visit | Attending: Internal Medicine | Admitting: Internal Medicine

## 2011-08-08 ENCOUNTER — Encounter (HOSPITAL_COMMUNITY): Payer: Self-pay

## 2011-08-12 ENCOUNTER — Encounter (HOSPITAL_COMMUNITY): Payer: Self-pay

## 2011-08-13 ENCOUNTER — Encounter (HOSPITAL_COMMUNITY): Payer: Self-pay

## 2011-08-15 ENCOUNTER — Encounter (HOSPITAL_COMMUNITY)
Admission: RE | Admit: 2011-08-15 | Discharge: 2011-08-15 | Disposition: A | Discharge status: Home / Self Care | Payer: Self-pay | Source: Ambulatory Visit | Attending: Internal Medicine | Admitting: Internal Medicine

## 2011-08-19 ENCOUNTER — Encounter (HOSPITAL_COMMUNITY)
Admission: RE | Admit: 2011-08-19 | Discharge: 2011-08-19 | Disposition: A | Discharge status: Home / Self Care | Payer: Self-pay | Source: Ambulatory Visit | Attending: Internal Medicine | Admitting: Internal Medicine

## 2011-08-20 ENCOUNTER — Encounter (HOSPITAL_COMMUNITY)
Admission: RE | Admit: 2011-08-20 | Discharge: 2011-08-20 | Disposition: A | Discharge status: Home / Self Care | Payer: Self-pay | Source: Ambulatory Visit | Attending: Internal Medicine | Admitting: Internal Medicine

## 2011-08-22 ENCOUNTER — Encounter (HOSPITAL_COMMUNITY)
Admission: RE | Admit: 2011-08-22 | Discharge: 2011-08-22 | Disposition: A | Discharge status: Home / Self Care | Payer: Self-pay | Source: Ambulatory Visit | Attending: Internal Medicine | Admitting: Internal Medicine

## 2011-08-26 ENCOUNTER — Encounter (HOSPITAL_COMMUNITY)
Admission: RE | Admit: 2011-08-26 | Discharge: 2011-08-26 | Disposition: A | Discharge status: Home / Self Care | Payer: Self-pay | Source: Ambulatory Visit | Attending: Internal Medicine | Admitting: Internal Medicine

## 2011-08-27 ENCOUNTER — Encounter (HOSPITAL_COMMUNITY)
Admission: RE | Admit: 2011-08-27 | Discharge: 2011-08-27 | Disposition: A | Discharge status: Home / Self Care | Payer: Self-pay | Source: Ambulatory Visit | Attending: Internal Medicine | Admitting: Internal Medicine

## 2011-08-29 ENCOUNTER — Encounter (HOSPITAL_COMMUNITY)
Admission: RE | Admit: 2011-08-29 | Discharge: 2011-08-29 | Disposition: A | Discharge status: Home / Self Care | Payer: Self-pay | Source: Ambulatory Visit | Attending: Internal Medicine | Admitting: Internal Medicine

## 2011-09-02 ENCOUNTER — Encounter (HOSPITAL_COMMUNITY)
Admission: RE | Admit: 2011-09-02 | Discharge: 2011-09-02 | Disposition: A | Discharge status: Home / Self Care | Payer: Self-pay | Source: Ambulatory Visit | Attending: Internal Medicine | Admitting: Internal Medicine

## 2011-09-03 ENCOUNTER — Encounter (HOSPITAL_COMMUNITY)
Admission: RE | Admit: 2011-09-03 | Discharge: 2011-09-03 | Disposition: A | Discharge status: Home / Self Care | Payer: Self-pay | Source: Ambulatory Visit | Attending: Internal Medicine | Admitting: Internal Medicine

## 2011-09-05 ENCOUNTER — Encounter (HOSPITAL_COMMUNITY)
Admission: RE | Admit: 2011-09-05 | Discharge: 2011-09-05 | Disposition: A | Discharge status: Home / Self Care | Payer: Self-pay | Source: Ambulatory Visit | Attending: Internal Medicine | Admitting: Internal Medicine

## 2011-09-05 DIAGNOSIS — I10 Essential (primary) hypertension: Secondary | ICD-10-CM | POA: Insufficient documentation

## 2011-09-05 DIAGNOSIS — Z5189 Encounter for other specified aftercare: Secondary | ICD-10-CM | POA: Insufficient documentation

## 2011-09-05 DIAGNOSIS — I442 Atrioventricular block, complete: Secondary | ICD-10-CM | POA: Insufficient documentation

## 2011-09-05 DIAGNOSIS — Q211 Atrial septal defect: Secondary | ICD-10-CM | POA: Insufficient documentation

## 2011-09-05 DIAGNOSIS — Z8249 Family history of ischemic heart disease and other diseases of the circulatory system: Secondary | ICD-10-CM | POA: Insufficient documentation

## 2011-09-05 DIAGNOSIS — Q2111 Secundum atrial septal defect: Secondary | ICD-10-CM | POA: Insufficient documentation

## 2011-09-05 DIAGNOSIS — I472 Ventricular tachycardia, unspecified: Secondary | ICD-10-CM | POA: Insufficient documentation

## 2011-09-05 DIAGNOSIS — E669 Obesity, unspecified: Secondary | ICD-10-CM | POA: Insufficient documentation

## 2011-09-05 DIAGNOSIS — Z7901 Long term (current) use of anticoagulants: Secondary | ICD-10-CM | POA: Insufficient documentation

## 2011-09-05 DIAGNOSIS — I509 Heart failure, unspecified: Secondary | ICD-10-CM | POA: Insufficient documentation

## 2011-09-05 DIAGNOSIS — I2789 Other specified pulmonary heart diseases: Secondary | ICD-10-CM | POA: Insufficient documentation

## 2011-09-05 DIAGNOSIS — Z9889 Other specified postprocedural states: Secondary | ICD-10-CM | POA: Insufficient documentation

## 2011-09-05 DIAGNOSIS — I440 Atrioventricular block, first degree: Secondary | ICD-10-CM | POA: Insufficient documentation

## 2011-09-05 DIAGNOSIS — I4729 Other ventricular tachycardia: Secondary | ICD-10-CM | POA: Insufficient documentation

## 2011-09-05 DIAGNOSIS — I498 Other specified cardiac arrhythmias: Secondary | ICD-10-CM | POA: Insufficient documentation

## 2011-09-05 DIAGNOSIS — E119 Type 2 diabetes mellitus without complications: Secondary | ICD-10-CM | POA: Insufficient documentation

## 2011-09-05 DIAGNOSIS — Z95810 Presence of automatic (implantable) cardiac defibrillator: Secondary | ICD-10-CM | POA: Insufficient documentation

## 2011-09-05 DIAGNOSIS — I428 Other cardiomyopathies: Secondary | ICD-10-CM | POA: Insufficient documentation

## 2011-09-09 ENCOUNTER — Encounter (HOSPITAL_COMMUNITY)
Admission: RE | Admit: 2011-09-09 | Discharge: 2011-09-09 | Disposition: A | Discharge status: Home / Self Care | Payer: Self-pay | Source: Ambulatory Visit | Attending: Internal Medicine | Admitting: Internal Medicine

## 2011-09-10 ENCOUNTER — Encounter (HOSPITAL_COMMUNITY)
Admission: RE | Admit: 2011-09-10 | Discharge: 2011-09-10 | Disposition: A | Discharge status: Home / Self Care | Payer: Self-pay | Source: Ambulatory Visit | Attending: Internal Medicine | Admitting: Internal Medicine

## 2011-09-12 ENCOUNTER — Encounter (HOSPITAL_COMMUNITY)
Admission: RE | Admit: 2011-09-12 | Discharge: 2011-09-12 | Disposition: A | Discharge status: Home / Self Care | Payer: Self-pay | Source: Ambulatory Visit | Attending: Internal Medicine | Admitting: Internal Medicine

## 2011-09-16 ENCOUNTER — Encounter (HOSPITAL_COMMUNITY)
Admission: RE | Admit: 2011-09-16 | Discharge: 2011-09-16 | Disposition: A | Discharge status: Home / Self Care | Payer: Self-pay | Source: Ambulatory Visit | Attending: Internal Medicine | Admitting: Internal Medicine

## 2011-09-17 ENCOUNTER — Encounter (HOSPITAL_COMMUNITY)
Admission: RE | Admit: 2011-09-17 | Discharge: 2011-09-17 | Disposition: A | Discharge status: Home / Self Care | Payer: Self-pay | Source: Ambulatory Visit | Attending: Internal Medicine | Admitting: Internal Medicine

## 2011-09-19 ENCOUNTER — Encounter (HOSPITAL_COMMUNITY)
Admission: RE | Admit: 2011-09-19 | Discharge: 2011-09-19 | Disposition: A | Discharge status: Home / Self Care | Payer: Self-pay | Source: Ambulatory Visit | Attending: Internal Medicine | Admitting: Internal Medicine

## 2011-09-23 ENCOUNTER — Encounter (HOSPITAL_COMMUNITY)
Admission: RE | Admit: 2011-09-23 | Discharge: 2011-09-23 | Disposition: A | Discharge status: Home / Self Care | Payer: Self-pay | Source: Ambulatory Visit | Attending: Internal Medicine | Admitting: Internal Medicine

## 2011-09-24 ENCOUNTER — Encounter (HOSPITAL_COMMUNITY)
Admission: RE | Admit: 2011-09-24 | Discharge: 2011-09-24 | Disposition: A | Discharge status: Home / Self Care | Payer: Self-pay | Source: Ambulatory Visit | Attending: Internal Medicine | Admitting: Internal Medicine

## 2011-09-26 ENCOUNTER — Encounter (HOSPITAL_COMMUNITY)
Admission: RE | Admit: 2011-09-26 | Discharge: 2011-09-26 | Disposition: A | Discharge status: Home / Self Care | Payer: Self-pay | Source: Ambulatory Visit | Attending: Internal Medicine | Admitting: Internal Medicine

## 2011-09-26 ENCOUNTER — Ambulatory Visit (INDEPENDENT_AMBULATORY_CARE_PROVIDER_SITE_OTHER): Payer: No Typology Code available for payment source | Admitting: *Deleted

## 2011-09-26 ENCOUNTER — Encounter: Payer: Self-pay | Admitting: Internal Medicine

## 2011-09-26 DIAGNOSIS — I509 Heart failure, unspecified: Secondary | ICD-10-CM

## 2011-09-26 DIAGNOSIS — Z95810 Presence of automatic (implantable) cardiac defibrillator: Secondary | ICD-10-CM

## 2011-09-27 ENCOUNTER — Encounter: Payer: Self-pay | Admitting: *Deleted

## 2011-09-27 LAB — REMOTE ICD DEVICE
AL IMPEDENCE ICD: 400 Ohm
BRDY-0004RV: 120 {beats}/min
HV IMPEDENCE: 44 Ohm
RV LEAD IMPEDENCE ICD: 430 Ohm
TZAT-0004FASTVT: 8
TZAT-0013SLOWVT: 3
TZAT-0018FASTVT: NEGATIVE
TZAT-0020SLOWVT: 1 ms
TZON-0003FASTVT: 300 ms
TZON-0005SLOWVT: 6
TZON-0010FASTVT: 40 ms
TZON-0010SLOWVT: 40 ms
TZST-0001FASTVT: 2
TZST-0001FASTVT: 3
TZST-0001FASTVT: 5
TZST-0001SLOWVT: 2
TZST-0001SLOWVT: 4
TZST-0003FASTVT: 20 J
TZST-0003FASTVT: 40 J
TZST-0003FASTVT: 40 J
TZST-0003SLOWVT: 30 J
TZST-0003SLOWVT: 40 J

## 2011-09-30 ENCOUNTER — Encounter (HOSPITAL_COMMUNITY): Payer: Self-pay

## 2011-10-01 ENCOUNTER — Encounter (HOSPITAL_COMMUNITY)
Admission: RE | Admit: 2011-10-01 | Discharge: 2011-10-01 | Disposition: A | Discharge status: Home / Self Care | Payer: Self-pay | Source: Ambulatory Visit | Attending: Internal Medicine | Admitting: Internal Medicine

## 2011-10-03 ENCOUNTER — Encounter (HOSPITAL_COMMUNITY): Payer: Self-pay

## 2011-10-07 ENCOUNTER — Encounter (HOSPITAL_COMMUNITY)
Admission: RE | Admit: 2011-10-07 | Discharge: 2011-10-07 | Disposition: A | Discharge status: Home / Self Care | Payer: Self-pay | Source: Ambulatory Visit | Attending: Internal Medicine | Admitting: Internal Medicine

## 2011-10-07 ENCOUNTER — Encounter: Payer: Self-pay | Admitting: *Deleted

## 2011-10-07 DIAGNOSIS — Z95810 Presence of automatic (implantable) cardiac defibrillator: Secondary | ICD-10-CM | POA: Insufficient documentation

## 2011-10-07 DIAGNOSIS — I472 Ventricular tachycardia, unspecified: Secondary | ICD-10-CM | POA: Insufficient documentation

## 2011-10-07 DIAGNOSIS — I428 Other cardiomyopathies: Secondary | ICD-10-CM | POA: Insufficient documentation

## 2011-10-07 DIAGNOSIS — I509 Heart failure, unspecified: Secondary | ICD-10-CM | POA: Insufficient documentation

## 2011-10-07 DIAGNOSIS — I442 Atrioventricular block, complete: Secondary | ICD-10-CM | POA: Insufficient documentation

## 2011-10-07 DIAGNOSIS — E119 Type 2 diabetes mellitus without complications: Secondary | ICD-10-CM | POA: Insufficient documentation

## 2011-10-07 DIAGNOSIS — I498 Other specified cardiac arrhythmias: Secondary | ICD-10-CM | POA: Insufficient documentation

## 2011-10-07 DIAGNOSIS — Z8249 Family history of ischemic heart disease and other diseases of the circulatory system: Secondary | ICD-10-CM | POA: Insufficient documentation

## 2011-10-07 DIAGNOSIS — Z9889 Other specified postprocedural states: Secondary | ICD-10-CM | POA: Insufficient documentation

## 2011-10-07 DIAGNOSIS — I2789 Other specified pulmonary heart diseases: Secondary | ICD-10-CM | POA: Insufficient documentation

## 2011-10-07 DIAGNOSIS — Q211 Atrial septal defect: Secondary | ICD-10-CM | POA: Insufficient documentation

## 2011-10-07 DIAGNOSIS — Z7901 Long term (current) use of anticoagulants: Secondary | ICD-10-CM | POA: Insufficient documentation

## 2011-10-07 DIAGNOSIS — I4729 Other ventricular tachycardia: Secondary | ICD-10-CM | POA: Insufficient documentation

## 2011-10-07 DIAGNOSIS — Z5189 Encounter for other specified aftercare: Secondary | ICD-10-CM | POA: Insufficient documentation

## 2011-10-07 DIAGNOSIS — E669 Obesity, unspecified: Secondary | ICD-10-CM | POA: Insufficient documentation

## 2011-10-07 DIAGNOSIS — Q2111 Secundum atrial septal defect: Secondary | ICD-10-CM | POA: Insufficient documentation

## 2011-10-07 DIAGNOSIS — I10 Essential (primary) hypertension: Secondary | ICD-10-CM | POA: Insufficient documentation

## 2011-10-07 DIAGNOSIS — I440 Atrioventricular block, first degree: Secondary | ICD-10-CM | POA: Insufficient documentation

## 2011-10-08 ENCOUNTER — Encounter (HOSPITAL_COMMUNITY): Payer: Self-pay

## 2011-10-10 ENCOUNTER — Encounter (HOSPITAL_COMMUNITY): Payer: Self-pay

## 2011-10-14 ENCOUNTER — Encounter (HOSPITAL_COMMUNITY): Payer: Self-pay

## 2011-10-15 ENCOUNTER — Encounter (HOSPITAL_COMMUNITY): Payer: Self-pay

## 2011-10-17 ENCOUNTER — Encounter (HOSPITAL_COMMUNITY): Payer: Self-pay

## 2011-10-18 ENCOUNTER — Other Ambulatory Visit (HOSPITAL_COMMUNITY): Payer: Self-pay | Admitting: Radiology

## 2011-10-18 DIAGNOSIS — R05 Cough: Secondary | ICD-10-CM

## 2011-10-21 ENCOUNTER — Other Ambulatory Visit: Payer: Self-pay | Admitting: Surgery

## 2011-10-21 ENCOUNTER — Encounter (HOSPITAL_COMMUNITY): Payer: Self-pay

## 2011-10-22 ENCOUNTER — Encounter (HOSPITAL_COMMUNITY): Payer: Self-pay

## 2011-10-24 ENCOUNTER — Encounter (HOSPITAL_COMMUNITY): Payer: Self-pay

## 2011-10-28 ENCOUNTER — Encounter (HOSPITAL_COMMUNITY): Payer: Self-pay

## 2011-10-29 ENCOUNTER — Ambulatory Visit (HOSPITAL_COMMUNITY)
Admission: RE | Admit: 2011-10-29 | Discharge: 2011-10-29 | Disposition: A | Discharge status: Home / Self Care | Payer: No Typology Code available for payment source | Source: Ambulatory Visit | Attending: Internal Medicine | Admitting: Internal Medicine

## 2011-10-29 ENCOUNTER — Encounter (HOSPITAL_COMMUNITY): Payer: Self-pay

## 2011-10-29 ENCOUNTER — Encounter (HOSPITAL_COMMUNITY): Payer: No Typology Code available for payment source

## 2011-10-29 DIAGNOSIS — R059 Cough, unspecified: Secondary | ICD-10-CM | POA: Insufficient documentation

## 2011-10-29 DIAGNOSIS — R05 Cough: Secondary | ICD-10-CM | POA: Insufficient documentation

## 2011-10-29 LAB — PULMONARY FUNCTION TEST

## 2011-10-29 MED ORDER — ALBUTEROL SULFATE (5 MG/ML) 0.5% IN NEBU
2.5000 mg | INHALATION_SOLUTION | Freq: Once | RESPIRATORY_TRACT | Status: AC
  Administered 2011-10-29: 2.5 mg via RESPIRATORY_TRACT

## 2011-10-31 ENCOUNTER — Encounter (HOSPITAL_COMMUNITY): Payer: Self-pay

## 2011-11-01 ENCOUNTER — Encounter (HOSPITAL_COMMUNITY): Payer: No Typology Code available for payment source

## 2011-11-04 ENCOUNTER — Encounter (HOSPITAL_COMMUNITY)
Admission: RE | Admit: 2011-11-04 | Discharge: 2011-11-04 | Disposition: A | Discharge status: Home / Self Care | Payer: Self-pay | Source: Ambulatory Visit | Attending: Internal Medicine | Admitting: Internal Medicine

## 2011-11-04 DIAGNOSIS — I498 Other specified cardiac arrhythmias: Secondary | ICD-10-CM | POA: Insufficient documentation

## 2011-11-04 DIAGNOSIS — I4729 Other ventricular tachycardia: Secondary | ICD-10-CM | POA: Insufficient documentation

## 2011-11-04 DIAGNOSIS — I472 Ventricular tachycardia, unspecified: Secondary | ICD-10-CM | POA: Insufficient documentation

## 2011-11-04 DIAGNOSIS — Z8249 Family history of ischemic heart disease and other diseases of the circulatory system: Secondary | ICD-10-CM | POA: Insufficient documentation

## 2011-11-04 DIAGNOSIS — I428 Other cardiomyopathies: Secondary | ICD-10-CM | POA: Insufficient documentation

## 2011-11-04 DIAGNOSIS — I10 Essential (primary) hypertension: Secondary | ICD-10-CM | POA: Insufficient documentation

## 2011-11-04 DIAGNOSIS — I509 Heart failure, unspecified: Secondary | ICD-10-CM | POA: Insufficient documentation

## 2011-11-04 DIAGNOSIS — Z5189 Encounter for other specified aftercare: Secondary | ICD-10-CM | POA: Insufficient documentation

## 2011-11-04 DIAGNOSIS — I440 Atrioventricular block, first degree: Secondary | ICD-10-CM | POA: Insufficient documentation

## 2011-11-04 DIAGNOSIS — Z9889 Other specified postprocedural states: Secondary | ICD-10-CM | POA: Insufficient documentation

## 2011-11-04 DIAGNOSIS — Z95810 Presence of automatic (implantable) cardiac defibrillator: Secondary | ICD-10-CM | POA: Insufficient documentation

## 2011-11-04 DIAGNOSIS — Q2111 Secundum atrial septal defect: Secondary | ICD-10-CM | POA: Insufficient documentation

## 2011-11-04 DIAGNOSIS — E119 Type 2 diabetes mellitus without complications: Secondary | ICD-10-CM | POA: Insufficient documentation

## 2011-11-04 DIAGNOSIS — I2789 Other specified pulmonary heart diseases: Secondary | ICD-10-CM | POA: Insufficient documentation

## 2011-11-04 DIAGNOSIS — Q211 Atrial septal defect: Secondary | ICD-10-CM | POA: Insufficient documentation

## 2011-11-04 DIAGNOSIS — Z7901 Long term (current) use of anticoagulants: Secondary | ICD-10-CM | POA: Insufficient documentation

## 2011-11-04 DIAGNOSIS — E669 Obesity, unspecified: Secondary | ICD-10-CM | POA: Insufficient documentation

## 2011-11-04 DIAGNOSIS — I442 Atrioventricular block, complete: Secondary | ICD-10-CM | POA: Insufficient documentation

## 2011-11-04 NOTE — Progress Notes (Signed)
Patient returned to exercise today after being out due to virus and cough. Pt is feeling better today, weight is down 3.5 kg from last session on 09/23/11, patient states his appetite has been down . Pt's BP was low throughout exercise. Pre-exercise BP was 98/62, but on the 1st station his BP dropped to 88/54 water given and recheck BP was 98/64. On the 2nd station pt's BP dropped to 80/42, Gatorade given, and recheck BP was 102/60. BP on the 3rd station was 98/52 and post-exercise was 104/60. Pt remained asymptomatic throughout. Pt's SaO2 was also low with exercise ranging from 84% - 93% on room air. Pt states he had a PFT last Tuesday and is awaiting the results of that test.

## 2011-11-05 ENCOUNTER — Encounter (HOSPITAL_COMMUNITY)
Admission: RE | Admit: 2011-11-05 | Discharge: 2011-11-05 | Disposition: A | Discharge status: Home / Self Care | Payer: Self-pay | Source: Ambulatory Visit | Attending: Internal Medicine | Admitting: Internal Medicine

## 2011-11-07 ENCOUNTER — Encounter (HOSPITAL_COMMUNITY): Payer: Self-pay

## 2011-11-11 ENCOUNTER — Encounter (HOSPITAL_COMMUNITY)
Admission: RE | Admit: 2011-11-11 | Discharge: 2011-11-11 | Disposition: A | Discharge status: Home / Self Care | Payer: Self-pay | Source: Ambulatory Visit | Attending: Internal Medicine | Admitting: Internal Medicine

## 2011-11-12 ENCOUNTER — Encounter (HOSPITAL_COMMUNITY): Payer: Self-pay

## 2011-11-14 ENCOUNTER — Encounter (HOSPITAL_COMMUNITY)
Admission: RE | Admit: 2011-11-14 | Discharge: 2011-11-14 | Disposition: A | Discharge status: Home / Self Care | Payer: Self-pay | Source: Ambulatory Visit | Attending: Internal Medicine | Admitting: Internal Medicine

## 2011-11-18 ENCOUNTER — Encounter (HOSPITAL_COMMUNITY): Payer: Self-pay

## 2011-11-19 ENCOUNTER — Encounter (HOSPITAL_COMMUNITY): Payer: Self-pay

## 2011-11-21 ENCOUNTER — Encounter (HOSPITAL_COMMUNITY): Payer: Self-pay

## 2011-11-25 ENCOUNTER — Encounter (HOSPITAL_COMMUNITY)
Admission: RE | Admit: 2011-11-25 | Discharge: 2011-11-25 | Disposition: A | Discharge status: Home / Self Care | Payer: Self-pay | Source: Ambulatory Visit | Attending: Internal Medicine | Admitting: Internal Medicine

## 2011-11-26 ENCOUNTER — Encounter (HOSPITAL_COMMUNITY)
Admission: RE | Admit: 2011-11-26 | Discharge: 2011-11-26 | Disposition: A | Discharge status: Home / Self Care | Payer: Self-pay | Source: Ambulatory Visit | Attending: Internal Medicine | Admitting: Internal Medicine

## 2011-11-28 ENCOUNTER — Encounter (HOSPITAL_COMMUNITY)
Admission: RE | Admit: 2011-11-28 | Discharge: 2011-11-28 | Disposition: A | Discharge status: Home / Self Care | Payer: Self-pay | Source: Ambulatory Visit | Attending: Internal Medicine | Admitting: Internal Medicine

## 2011-12-02 ENCOUNTER — Encounter (HOSPITAL_COMMUNITY)
Admission: RE | Admit: 2011-12-02 | Discharge: 2011-12-02 | Disposition: A | Discharge status: Home / Self Care | Payer: Self-pay | Source: Ambulatory Visit | Attending: Internal Medicine | Admitting: Internal Medicine

## 2011-12-03 ENCOUNTER — Encounter (HOSPITAL_COMMUNITY)
Admission: RE | Admit: 2011-12-03 | Discharge: 2011-12-03 | Disposition: A | Discharge status: Home / Self Care | Payer: Self-pay | Source: Ambulatory Visit | Attending: Internal Medicine | Admitting: Internal Medicine

## 2011-12-05 ENCOUNTER — Encounter (HOSPITAL_COMMUNITY): Payer: Self-pay

## 2011-12-05 DIAGNOSIS — I509 Heart failure, unspecified: Secondary | ICD-10-CM | POA: Insufficient documentation

## 2011-12-05 DIAGNOSIS — I428 Other cardiomyopathies: Secondary | ICD-10-CM | POA: Insufficient documentation

## 2011-12-05 DIAGNOSIS — Q2111 Secundum atrial septal defect: Secondary | ICD-10-CM | POA: Insufficient documentation

## 2011-12-05 DIAGNOSIS — I442 Atrioventricular block, complete: Secondary | ICD-10-CM | POA: Insufficient documentation

## 2011-12-05 DIAGNOSIS — I498 Other specified cardiac arrhythmias: Secondary | ICD-10-CM | POA: Insufficient documentation

## 2011-12-05 DIAGNOSIS — I440 Atrioventricular block, first degree: Secondary | ICD-10-CM | POA: Insufficient documentation

## 2011-12-05 DIAGNOSIS — Q211 Atrial septal defect: Secondary | ICD-10-CM | POA: Insufficient documentation

## 2011-12-05 DIAGNOSIS — E669 Obesity, unspecified: Secondary | ICD-10-CM | POA: Insufficient documentation

## 2011-12-05 DIAGNOSIS — Z7901 Long term (current) use of anticoagulants: Secondary | ICD-10-CM | POA: Insufficient documentation

## 2011-12-05 DIAGNOSIS — Z8249 Family history of ischemic heart disease and other diseases of the circulatory system: Secondary | ICD-10-CM | POA: Insufficient documentation

## 2011-12-05 DIAGNOSIS — I472 Ventricular tachycardia, unspecified: Secondary | ICD-10-CM | POA: Insufficient documentation

## 2011-12-05 DIAGNOSIS — I10 Essential (primary) hypertension: Secondary | ICD-10-CM | POA: Insufficient documentation

## 2011-12-05 DIAGNOSIS — Z9889 Other specified postprocedural states: Secondary | ICD-10-CM | POA: Insufficient documentation

## 2011-12-05 DIAGNOSIS — Z95810 Presence of automatic (implantable) cardiac defibrillator: Secondary | ICD-10-CM | POA: Insufficient documentation

## 2011-12-05 DIAGNOSIS — I2789 Other specified pulmonary heart diseases: Secondary | ICD-10-CM | POA: Insufficient documentation

## 2011-12-05 DIAGNOSIS — E119 Type 2 diabetes mellitus without complications: Secondary | ICD-10-CM | POA: Insufficient documentation

## 2011-12-05 DIAGNOSIS — I4729 Other ventricular tachycardia: Secondary | ICD-10-CM | POA: Insufficient documentation

## 2011-12-05 DIAGNOSIS — Z5189 Encounter for other specified aftercare: Secondary | ICD-10-CM | POA: Insufficient documentation

## 2011-12-09 ENCOUNTER — Encounter (HOSPITAL_COMMUNITY)
Admission: RE | Admit: 2011-12-09 | Discharge: 2011-12-09 | Disposition: A | Discharge status: Home / Self Care | Payer: Self-pay | Source: Ambulatory Visit | Attending: Internal Medicine | Admitting: Internal Medicine

## 2011-12-10 ENCOUNTER — Encounter (HOSPITAL_COMMUNITY)
Admission: RE | Admit: 2011-12-10 | Discharge: 2011-12-10 | Disposition: A | Discharge status: Home / Self Care | Payer: Self-pay | Source: Ambulatory Visit | Attending: Internal Medicine | Admitting: Internal Medicine

## 2011-12-12 ENCOUNTER — Encounter (HOSPITAL_COMMUNITY)
Admission: RE | Admit: 2011-12-12 | Discharge: 2011-12-12 | Disposition: A | Discharge status: Home / Self Care | Payer: Self-pay | Source: Ambulatory Visit | Attending: Internal Medicine | Admitting: Internal Medicine

## 2011-12-16 ENCOUNTER — Encounter: Payer: Self-pay | Admitting: Internal Medicine

## 2011-12-16 ENCOUNTER — Ambulatory Visit (INDEPENDENT_AMBULATORY_CARE_PROVIDER_SITE_OTHER): Payer: No Typology Code available for payment source | Admitting: Internal Medicine

## 2011-12-16 ENCOUNTER — Encounter (HOSPITAL_COMMUNITY)
Admission: RE | Admit: 2011-12-16 | Discharge: 2011-12-16 | Disposition: A | Discharge status: Home / Self Care | Payer: Self-pay | Source: Ambulatory Visit | Attending: Internal Medicine | Admitting: Internal Medicine

## 2011-12-16 VITALS — BP 132/87 | HR 59 | Ht 66.5 in | Wt 169.8 lb

## 2011-12-16 DIAGNOSIS — I4891 Unspecified atrial fibrillation: Secondary | ICD-10-CM

## 2011-12-16 DIAGNOSIS — I509 Heart failure, unspecified: Secondary | ICD-10-CM

## 2011-12-16 DIAGNOSIS — I10 Essential (primary) hypertension: Secondary | ICD-10-CM

## 2011-12-16 DIAGNOSIS — Z95810 Presence of automatic (implantable) cardiac defibrillator: Secondary | ICD-10-CM

## 2011-12-16 DIAGNOSIS — I429 Cardiomyopathy, unspecified: Secondary | ICD-10-CM

## 2011-12-16 LAB — ICD DEVICE OBSERVATION
HV IMPEDENCE: 45 Ohm
LV LEAD IMPEDENCE ICD: 390 Ohm
RV LEAD IMPEDENCE ICD: 460 Ohm
TZAT-0012SLOWVT: 200 ms
TZAT-0013FASTVT: 1
TZAT-0013SLOWVT: 3
TZAT-0018FASTVT: NEGATIVE
TZAT-0019FASTVT: 7.5 V
TZAT-0020FASTVT: 1 ms
TZAT-0020SLOWVT: 1 ms
TZON-0003FASTVT: 300 ms
TZON-0004FASTVT: 16
TZON-0005FASTVT: 6
TZON-0005SLOWVT: 6
TZST-0001FASTVT: 2
TZST-0001FASTVT: 4
TZST-0001SLOWVT: 5
TZST-0003FASTVT: 36 J
TZST-0003FASTVT: 40 J
TZST-0003SLOWVT: 30 J

## 2011-12-16 NOTE — Progress Notes (Signed)
HPI [employer] returns today for followup. He is an 78 year old man with a long-standing nonischemic cardiomyopathy, chronic systolic heart failure, status post biventricular ICD implantation. In the interim, he has done well. He remains active in his garden. He does admit to some dietary indiscretion with sodium. No syncope or any recent ICD shocks. He is very mild peripheral edema. No Known Allergies   Current Outpatient Prescriptions  Medication Sig Dispense Refill  . aspirin EC 325 MG EC tablet Take 325 mg by mouth daily.        . carvedilol (COREG) 6.25 MG tablet Take 1 tablet (6.25 mg total) by mouth 2 (two) times daily with a meal.  180 tablet  5  . furosemide (LASIX) 40 MG tablet Take one and one half tablets once daily  135 tablet  4  . niacin (NIASPAN) 500 MG CR tablet Take 500 mg by mouth daily.        . potassium chloride (K-DUR) 10 MEQ tablet Take 10 mEq by mouth daily.      . Tamsulosin HCl (FLOMAX) 0.4 MG CAPS Take 0.4 mg by mouth at bedtime.           Past Medical History  Diagnosis Date  . Automatic implantable cardiac defibrillator in situ     BiV pacer  . Secondary cardiomyopathy, unspecified     nicm ef 30-35%,nonob cad-cath 2006,echo 2/12:ef30-35%.mod lvh,inf hk,mild-mod lae and mild rae,pasp 35  . Congestive heart failure, unspecified   . Unspecified essential hypertension   . History of atrial fibrillation     ROS:   All systems reviewed and negative except as noted in the HPI.   Past Surgical History  Procedure Date  . Rt. shoulder   . Bilateral knee arthroscopy   . Colonoscopy with polypectomy   . Intraoperative transesophageal echocardiogram   . Median sterotomy for mitral valve repair   . Implantation of a dual chamber biventricular icd      Family History  Problem Relation Age of Onset  . Heart disease Neg Hx     Rheumatic or valvular heart disease   . Cardiomyopathy Neg Hx      History   Social History  . Marital Status: Married   Spouse Name: N/A    Number of Children: 6  . Years of Education: N/A   Occupational History  . 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 [employer] in his [employer]    Social History Main Topics  . Smoking status: Former Smoker    Quit date: 05/06/1961  . Smokeless tobacco: Never Used  . Alcohol Use: No  . Drug Use: Not on file  . Sexually Active: Not on file   Other Topics Concern  . Not on file   Social History Narrative   Lives with his wife in [place].Has 6 grown children.Remains quite active.     BP 132/87  Pulse 59  Ht 5' 6.5" (1.689 m)  Wt 169 lb 12.8 oz (77.021 kg)  BMI 27.00 kg/m2  Physical Exam:  Well appearing elderly man, NAD HEENT: Unremarkable Neck:  No JVD, no thyromegally Lungs:  Clear with no wheezes, rales, or rhonchi. Well-healed ICD incision. HEART:  Regular rate rhythm, no murmurs, no rubs, no clicks Abd:  soft, positive bowel sounds, no organomegally, no rebound, no guarding Ext:  2 plus pulses, no edema, no cyanosis, no clubbing Skin:  No rashes no nodules Neuro:  CN II through XII intact, motor grossly intact  DEVICE  Normal device function.  See PaceArt for details.   Assess/Plan:

## 2011-12-16 NOTE — Patient Instructions (Addendum)
Your physician wants you to follow-up in: 12 months with Dr Court Joy will receive a reminder letter in the mail two months in advance. If you don't receive a letter, please call our office to schedule the follow-up appointment.  Remote monitoring is used to monitor your Pacemaker of ICD from home. This monitoring reduces the number of office visits required to check your device to one time per year. It allows [place] to keep an eye on the functioning of your device to ensure it is working properly. You are scheduled for a device check from home on March 23, 2012. You may send your transmission at any time that day. If you have a wireless device, the transmission will be sent automatically. After your physician reviews your transmission, you will receive a postcard with your next transmission date.

## 2011-12-16 NOTE — Assessment & Plan Note (Signed)
His blood pressure is controlled. I've encouraged the patient to maintain a low-sodium diet. He does admit to some dietary indiscretion and states that he will try to do better in the future.

## 2011-12-16 NOTE — Assessment & Plan Note (Signed)
He is maintaining atrial fibrillation but his rate is controlled. No change in medical therapy at this time.

## 2011-12-16 NOTE — Assessment & Plan Note (Signed)
His [employer] Jude biventricular ICD is working normally. We'll plan to recheck in several months. 

## 2011-12-17 ENCOUNTER — Encounter (HOSPITAL_COMMUNITY)
Admission: RE | Admit: 2011-12-17 | Discharge: 2011-12-17 | Disposition: A | Discharge status: Home / Self Care | Payer: Self-pay | Source: Ambulatory Visit | Attending: Internal Medicine | Admitting: Internal Medicine

## 2011-12-19 ENCOUNTER — Encounter (HOSPITAL_COMMUNITY)
Admission: RE | Admit: 2011-12-19 | Discharge: 2011-12-19 | Disposition: A | Discharge status: Home / Self Care | Payer: Self-pay | Source: Ambulatory Visit | Attending: Internal Medicine | Admitting: Internal Medicine

## 2011-12-23 ENCOUNTER — Encounter (HOSPITAL_COMMUNITY)
Admission: RE | Admit: 2011-12-23 | Discharge: 2011-12-23 | Disposition: A | Discharge status: Home / Self Care | Payer: Self-pay | Source: Ambulatory Visit | Attending: Internal Medicine | Admitting: Internal Medicine

## 2011-12-24 ENCOUNTER — Encounter (HOSPITAL_COMMUNITY)
Admission: RE | Admit: 2011-12-24 | Discharge: 2011-12-24 | Disposition: A | Discharge status: Home / Self Care | Payer: Self-pay | Source: Ambulatory Visit | Attending: Internal Medicine | Admitting: Internal Medicine

## 2011-12-26 ENCOUNTER — Encounter (HOSPITAL_COMMUNITY)
Admission: RE | Admit: 2011-12-26 | Discharge: 2011-12-26 | Disposition: A | Discharge status: Home / Self Care | Payer: Self-pay | Source: Ambulatory Visit | Attending: Internal Medicine | Admitting: Internal Medicine

## 2011-12-30 ENCOUNTER — Encounter (HOSPITAL_COMMUNITY)
Admission: RE | Admit: 2011-12-30 | Discharge: 2011-12-30 | Disposition: A | Discharge status: Home / Self Care | Payer: Self-pay | Source: Ambulatory Visit | Attending: Internal Medicine | Admitting: Internal Medicine

## 2011-12-31 ENCOUNTER — Encounter (HOSPITAL_COMMUNITY)
Admission: RE | Admit: 2011-12-31 | Discharge: 2011-12-31 | Disposition: A | Discharge status: Home / Self Care | Payer: Self-pay | Source: Ambulatory Visit | Attending: Internal Medicine | Admitting: Internal Medicine

## 2012-01-02 ENCOUNTER — Encounter (HOSPITAL_COMMUNITY)
Admission: RE | Admit: 2012-01-02 | Discharge: 2012-01-02 | Disposition: A | Discharge status: Home / Self Care | Payer: Self-pay | Source: Ambulatory Visit | Attending: Internal Medicine | Admitting: Internal Medicine

## 2012-01-06 ENCOUNTER — Encounter (HOSPITAL_COMMUNITY): Payer: Self-pay

## 2012-01-07 ENCOUNTER — Encounter (HOSPITAL_COMMUNITY)
Admission: RE | Admit: 2012-01-07 | Discharge: 2012-01-07 | Disposition: A | Discharge status: Home / Self Care | Payer: Self-pay | Source: Ambulatory Visit | Attending: Internal Medicine | Admitting: Internal Medicine

## 2012-01-07 DIAGNOSIS — I442 Atrioventricular block, complete: Secondary | ICD-10-CM | POA: Insufficient documentation

## 2012-01-07 DIAGNOSIS — I472 Ventricular tachycardia, unspecified: Secondary | ICD-10-CM | POA: Insufficient documentation

## 2012-01-07 DIAGNOSIS — E669 Obesity, unspecified: Secondary | ICD-10-CM | POA: Insufficient documentation

## 2012-01-07 DIAGNOSIS — I10 Essential (primary) hypertension: Secondary | ICD-10-CM | POA: Insufficient documentation

## 2012-01-07 DIAGNOSIS — I440 Atrioventricular block, first degree: Secondary | ICD-10-CM | POA: Insufficient documentation

## 2012-01-07 DIAGNOSIS — Z5189 Encounter for other specified aftercare: Secondary | ICD-10-CM | POA: Insufficient documentation

## 2012-01-07 DIAGNOSIS — I509 Heart failure, unspecified: Secondary | ICD-10-CM | POA: Insufficient documentation

## 2012-01-07 DIAGNOSIS — Q2111 Secundum atrial septal defect: Secondary | ICD-10-CM | POA: Insufficient documentation

## 2012-01-07 DIAGNOSIS — Z8249 Family history of ischemic heart disease and other diseases of the circulatory system: Secondary | ICD-10-CM | POA: Insufficient documentation

## 2012-01-07 DIAGNOSIS — Z95810 Presence of automatic (implantable) cardiac defibrillator: Secondary | ICD-10-CM | POA: Insufficient documentation

## 2012-01-07 DIAGNOSIS — E119 Type 2 diabetes mellitus without complications: Secondary | ICD-10-CM | POA: Insufficient documentation

## 2012-01-07 DIAGNOSIS — Z9889 Other specified postprocedural states: Secondary | ICD-10-CM | POA: Insufficient documentation

## 2012-01-07 DIAGNOSIS — Q211 Atrial septal defect: Secondary | ICD-10-CM | POA: Insufficient documentation

## 2012-01-07 DIAGNOSIS — I498 Other specified cardiac arrhythmias: Secondary | ICD-10-CM | POA: Insufficient documentation

## 2012-01-07 DIAGNOSIS — I4729 Other ventricular tachycardia: Secondary | ICD-10-CM | POA: Insufficient documentation

## 2012-01-07 DIAGNOSIS — I428 Other cardiomyopathies: Secondary | ICD-10-CM | POA: Insufficient documentation

## 2012-01-07 DIAGNOSIS — Z7901 Long term (current) use of anticoagulants: Secondary | ICD-10-CM | POA: Insufficient documentation

## 2012-01-07 DIAGNOSIS — I2789 Other specified pulmonary heart diseases: Secondary | ICD-10-CM | POA: Insufficient documentation

## 2012-01-09 ENCOUNTER — Encounter (HOSPITAL_COMMUNITY): Payer: Self-pay

## 2012-01-10 ENCOUNTER — Encounter: Payer: No Typology Code available for payment source | Admitting: Internal Medicine

## 2012-01-13 ENCOUNTER — Encounter (HOSPITAL_COMMUNITY)
Admission: RE | Admit: 2012-01-13 | Discharge: 2012-01-13 | Disposition: A | Discharge status: Home / Self Care | Payer: Self-pay | Source: Ambulatory Visit | Attending: Internal Medicine | Admitting: Internal Medicine

## 2012-01-14 ENCOUNTER — Encounter (HOSPITAL_COMMUNITY)
Admission: RE | Admit: 2012-01-14 | Discharge: 2012-01-14 | Disposition: A | Discharge status: Home / Self Care | Payer: Self-pay | Source: Ambulatory Visit | Attending: Internal Medicine | Admitting: Internal Medicine

## 2012-01-16 ENCOUNTER — Encounter (HOSPITAL_COMMUNITY)
Admission: RE | Admit: 2012-01-16 | Discharge: 2012-01-16 | Disposition: A | Discharge status: Home / Self Care | Payer: Self-pay | Source: Ambulatory Visit | Attending: Internal Medicine | Admitting: Internal Medicine

## 2012-01-19 ENCOUNTER — Other Ambulatory Visit: Payer: Self-pay | Admitting: Internal Medicine

## 2012-01-20 ENCOUNTER — Encounter (HOSPITAL_COMMUNITY)
Admission: RE | Admit: 2012-01-20 | Discharge: 2012-01-20 | Disposition: A | Discharge status: Home / Self Care | Payer: Self-pay | Source: Ambulatory Visit | Attending: Internal Medicine | Admitting: Internal Medicine

## 2012-01-21 ENCOUNTER — Encounter (HOSPITAL_COMMUNITY)
Admission: RE | Admit: 2012-01-21 | Discharge: 2012-01-21 | Disposition: A | Discharge status: Home / Self Care | Payer: Self-pay | Source: Ambulatory Visit | Attending: Internal Medicine | Admitting: Internal Medicine

## 2012-01-22 ENCOUNTER — Encounter: Payer: Self-pay | Admitting: Internal Medicine

## 2012-01-23 ENCOUNTER — Encounter (HOSPITAL_COMMUNITY)
Admission: RE | Admit: 2012-01-23 | Discharge: 2012-01-23 | Disposition: A | Discharge status: Home / Self Care | Payer: Self-pay | Source: Ambulatory Visit | Attending: Internal Medicine | Admitting: Internal Medicine

## 2012-01-27 ENCOUNTER — Encounter (HOSPITAL_COMMUNITY)
Admission: RE | Admit: 2012-01-27 | Discharge: 2012-01-27 | Disposition: A | Discharge status: Home / Self Care | Payer: Self-pay | Source: Ambulatory Visit | Attending: Internal Medicine | Admitting: Internal Medicine

## 2012-01-28 ENCOUNTER — Encounter (HOSPITAL_COMMUNITY)
Admission: RE | Admit: 2012-01-28 | Discharge: 2012-01-28 | Disposition: A | Discharge status: Home / Self Care | Payer: Self-pay | Source: Ambulatory Visit | Attending: Internal Medicine | Admitting: Internal Medicine

## 2012-01-30 ENCOUNTER — Encounter (HOSPITAL_COMMUNITY)
Admission: RE | Admit: 2012-01-30 | Discharge: 2012-01-30 | Disposition: A | Discharge status: Home / Self Care | Payer: Self-pay | Source: Ambulatory Visit | Attending: Internal Medicine | Admitting: Internal Medicine

## 2012-02-03 ENCOUNTER — Encounter (HOSPITAL_COMMUNITY)
Admission: RE | Admit: 2012-02-03 | Discharge: 2012-02-03 | Disposition: A | Discharge status: Home / Self Care | Payer: Self-pay | Source: Ambulatory Visit | Attending: Internal Medicine | Admitting: Internal Medicine

## 2012-02-04 ENCOUNTER — Ambulatory Visit (INDEPENDENT_AMBULATORY_CARE_PROVIDER_SITE_OTHER): Payer: No Typology Code available for payment source | Admitting: Internal Medicine

## 2012-02-04 ENCOUNTER — Encounter: Payer: Self-pay | Admitting: Internal Medicine

## 2012-02-04 ENCOUNTER — Encounter (HOSPITAL_COMMUNITY)
Admission: RE | Admit: 2012-02-04 | Discharge: 2012-02-04 | Disposition: A | Discharge status: Home / Self Care | Payer: Self-pay | Source: Ambulatory Visit | Attending: Internal Medicine | Admitting: Internal Medicine

## 2012-02-04 VITALS — BP 133/84 | HR 60 | Ht 66.0 in | Wt 174.0 lb

## 2012-02-04 DIAGNOSIS — I509 Heart failure, unspecified: Secondary | ICD-10-CM | POA: Insufficient documentation

## 2012-02-04 DIAGNOSIS — Z95810 Presence of automatic (implantable) cardiac defibrillator: Secondary | ICD-10-CM

## 2012-02-04 DIAGNOSIS — I440 Atrioventricular block, first degree: Secondary | ICD-10-CM | POA: Insufficient documentation

## 2012-02-04 DIAGNOSIS — I4729 Other ventricular tachycardia: Secondary | ICD-10-CM | POA: Insufficient documentation

## 2012-02-04 DIAGNOSIS — I2789 Other specified pulmonary heart diseases: Secondary | ICD-10-CM | POA: Insufficient documentation

## 2012-02-04 DIAGNOSIS — E785 Hyperlipidemia, unspecified: Secondary | ICD-10-CM | POA: Insufficient documentation

## 2012-02-04 DIAGNOSIS — Z9889 Other specified postprocedural states: Secondary | ICD-10-CM | POA: Insufficient documentation

## 2012-02-04 DIAGNOSIS — Q2111 Secundum atrial septal defect: Secondary | ICD-10-CM | POA: Insufficient documentation

## 2012-02-04 DIAGNOSIS — E669 Obesity, unspecified: Secondary | ICD-10-CM | POA: Insufficient documentation

## 2012-02-04 DIAGNOSIS — I10 Essential (primary) hypertension: Secondary | ICD-10-CM | POA: Insufficient documentation

## 2012-02-04 DIAGNOSIS — I428 Other cardiomyopathies: Secondary | ICD-10-CM | POA: Insufficient documentation

## 2012-02-04 DIAGNOSIS — Z5189 Encounter for other specified aftercare: Secondary | ICD-10-CM | POA: Insufficient documentation

## 2012-02-04 DIAGNOSIS — Z8249 Family history of ischemic heart disease and other diseases of the circulatory system: Secondary | ICD-10-CM | POA: Insufficient documentation

## 2012-02-04 DIAGNOSIS — Q211 Atrial septal defect: Secondary | ICD-10-CM | POA: Insufficient documentation

## 2012-02-04 DIAGNOSIS — Z7901 Long term (current) use of anticoagulants: Secondary | ICD-10-CM | POA: Insufficient documentation

## 2012-02-04 DIAGNOSIS — I429 Cardiomyopathy, unspecified: Secondary | ICD-10-CM

## 2012-02-04 DIAGNOSIS — I472 Ventricular tachycardia, unspecified: Secondary | ICD-10-CM | POA: Insufficient documentation

## 2012-02-04 DIAGNOSIS — E119 Type 2 diabetes mellitus without complications: Secondary | ICD-10-CM | POA: Insufficient documentation

## 2012-02-04 DIAGNOSIS — I498 Other specified cardiac arrhythmias: Secondary | ICD-10-CM | POA: Insufficient documentation

## 2012-02-04 DIAGNOSIS — I442 Atrioventricular block, complete: Secondary | ICD-10-CM | POA: Insufficient documentation

## 2012-02-04 LAB — ICD DEVICE OBSERVATION
AL AMPLITUDE: 0.7 mv
LV LEAD THRESHOLD: 1.25 V
RV LEAD THRESHOLD: 1 V
TZAT-0001FASTVT: 1
TZAT-0004FASTVT: 8
TZAT-0018SLOWVT: NEGATIVE
TZAT-0019FASTVT: 7.5 V
TZAT-0019SLOWVT: 7.5 V
TZAT-0020SLOWVT: 1 ms
TZON-0003SLOWVT: 375 ms
TZON-0005SLOWVT: 6
TZON-0010FASTVT: 40 ms
TZON-0010SLOWVT: 40 ms
TZST-0001FASTVT: 3
TZST-0001FASTVT: 4
TZST-0001SLOWVT: 2
TZST-0001SLOWVT: 4
TZST-0003FASTVT: 20 J
TZST-0003FASTVT: 40 J
TZST-0003FASTVT: 40 J
TZST-0003SLOWVT: 30 J
TZST-0003SLOWVT: 40 J

## 2012-02-04 NOTE — Assessment & Plan Note (Signed)
His device is working normally. We'll plan to recheck in several months. 

## 2012-02-04 NOTE — Progress Notes (Signed)
HPI [name] returns today for followup. He is a 78-year-old man with chronic systolic heart failure, a nonischemic cardiomyopathy, and severe left ventricular dysfunction. I saw the patient last approximately 8 weeks ago. At that time he was having dizziness and we backed off on his diuretic. Over the last several days, he has gained weight. He is approximately 5 pounds from his visit 2 months ago. He denies much in the way of dizziness but does note increasing shortness of breath. He is also had peripheral edema. He denies sodium indiscretion. His only other complaint today is flushing in his face after taking niacin. No Known Allergies   Current Outpatient Prescriptions  Medication Sig Dispense Refill  . aspirin EC 325 MG EC tablet Take 325 mg by mouth daily.        . carvedilol (COREG) 6.25 MG tablet Take 1 tablet (6.25 mg total) by mouth 2 (two) times daily with a meal.  180 tablet  5  . furosemide (LASIX) 40 MG tablet Take one and one half tablets once daily  135 tablet  4  . niacin (NIASPAN) 500 MG CR tablet Take 500 mg by mouth daily.        . potassium chloride (K-DUR,KLOR-CON) 10 MEQ tablet TAKE 1 TABLET BY MOUTH DAILY  30 tablet  6  . Tamsulosin HCl (FLOMAX) 0.4 MG CAPS Take 0.4 mg by mouth at bedtime.           Past Medical History  Diagnosis Date  . Automatic implantable cardiac defibrillator in situ     BiV pacer  . Secondary cardiomyopathy, unspecified     nicm ef 30-35%,nonob cad-cath 2006,echo 2/12:ef30-35%.mod lvh,inf hk,mild-mod lae and mild rae,pasp 35  . Congestive heart failure, unspecified   . Unspecified essential hypertension   . History of atrial fibrillation     ROS:   All systems reviewed and negative except as noted in the HPI.   Past Surgical History  Procedure Date  . Rt. shoulder   . Bilateral knee arthroscopy   . Colonoscopy with polypectomy   . Intraoperative transesophageal echocardiogram   . Median sterotomy for mitral valve repair   .  Implantation of a dual chamber biventricular icd      Family History  Problem Relation Age of Onset  . Heart disease Neg Hx     Rheumatic or valvular heart disease   . Cardiomyopathy Neg Hx      History   Social History  . Marital Status: Married    Spouse Name: N/A    Number of Children: 6  . Years of Education: N/A   Occupational History  . 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 [employer] G[employer] in his church    Social History Main Topics  . Smoking status: Former Smoker    Quit date: 05/06/1961  . Smokeless tobacco: Never Used  . Alcohol Use: No  . Drug Use: Not on file  . Sexually Active: Not on file   Other Topics Concern  . Not on file   Social History Narrative   Lives with his wife in [place].Has 6 grown children.Remains quite active.     BP 133/84  Pulse 60  Ht 5\' 6"  (1.676 m)  Wt 174 lb (78.926 kg)  BMI 28.08 kg/m2  SpO2 93%  Physical Exam:  Well appearing elderly man, NAD HEENT: Unremarkable Neck:  8 cm JVD, no thyromegally Lungs:  Scattered rales in the bases bilaterally approximately one fourth the way  up HEART:  Regular rate rhythm, no murmurs, no rubs, no clicks Abd:  soft, positive bowel sounds, no organomegally, no rebound, no guarding Ext:  2 plus pulses, 2+ edema, no cyanosis, no clubbing Skin:  No rashes no nodules Neuro:  CN II through XII intact, motor grossly intact  DEVICE  Normal device function.  See PaceArt for details.   Assess/Plan:

## 2012-02-04 NOTE — Patient Instructions (Addendum)
Remote monitoring is used to monitor your Pacemaker of ICD from home. This monitoring reduces the number of office visits required to check your device to one time per year. It allows [place] to keep an eye on the functioning of your device to ensure it is working properly. You are scheduled for a device check from home on May 11, 2012. You may send your transmission at any time that day. If you have a wireless device, the transmission will be sent automatically. After your physician reviews your transmission, you will receive a postcard with your next transmission date.  Your physician wants you to follow-up in: 1 year with Dr Ladona Ridgel.  You will receive a reminder letter in the mail two months in advance. If you don't receive a letter, please call our office to schedule the follow-up appointment.   Take your Aspirin prior to Niaspan  Increase your Furosemide to 1 1/2 tablets for 3 days , then decrease back to 1 daily

## 2012-02-04 NOTE — Assessment & Plan Note (Signed)
His chronic systolic heart failure has worsened a bit since his last visit. At that time we thought he might be overly diuresed him back off on his Lasix. His weight is up 5 pounds. He has evidence of rales in the lungs and peripheral edema. I've asked the patient to go up to one half tablets of Lasix twice a day for the next 3 or 4 days. If his weight goes back down for 5 pounds, he is asked to back off to 1 tablet twice a day. He is encouraged to maintain a low-sodium diet.

## 2012-02-04 NOTE — Assessment & Plan Note (Signed)
On niacin, the patient has developed flushing. I've asked the patient to take his aspirin 30 minutes before taking the niacin. In addition I've asked the patient is a low-fat snack prior to taking his niacin. Hopefully this will help improve his symptoms.

## 2012-02-06 ENCOUNTER — Encounter (HOSPITAL_COMMUNITY)
Admission: RE | Admit: 2012-02-06 | Discharge: 2012-02-06 | Disposition: A | Discharge status: Home / Self Care | Payer: Self-pay | Source: Ambulatory Visit | Attending: Internal Medicine | Admitting: Internal Medicine

## 2012-02-11 ENCOUNTER — Encounter (HOSPITAL_COMMUNITY)
Admission: RE | Admit: 2012-02-11 | Discharge: 2012-02-11 | Disposition: A | Discharge status: Home / Self Care | Payer: Self-pay | Source: Ambulatory Visit | Attending: Internal Medicine | Admitting: Internal Medicine

## 2012-02-13 ENCOUNTER — Encounter (HOSPITAL_COMMUNITY)
Admission: RE | Admit: 2012-02-13 | Discharge: 2012-02-13 | Disposition: A | Discharge status: Home / Self Care | Payer: Self-pay | Source: Ambulatory Visit | Attending: Internal Medicine | Admitting: Internal Medicine

## 2012-02-18 ENCOUNTER — Encounter (HOSPITAL_COMMUNITY)
Admission: RE | Admit: 2012-02-18 | Discharge: 2012-02-18 | Disposition: A | Discharge status: Home / Self Care | Payer: Self-pay | Source: Ambulatory Visit | Attending: Internal Medicine | Admitting: Internal Medicine

## 2012-02-20 ENCOUNTER — Encounter (HOSPITAL_COMMUNITY)
Admission: RE | Admit: 2012-02-20 | Discharge: 2012-02-20 | Disposition: A | Discharge status: Home / Self Care | Payer: Self-pay | Source: Ambulatory Visit | Attending: Internal Medicine | Admitting: Internal Medicine

## 2012-02-24 ENCOUNTER — Encounter (HOSPITAL_COMMUNITY)
Admission: RE | Admit: 2012-02-24 | Discharge: 2012-02-24 | Disposition: A | Discharge status: Home / Self Care | Payer: Self-pay | Source: Ambulatory Visit | Attending: Internal Medicine | Admitting: Internal Medicine

## 2012-02-25 ENCOUNTER — Encounter (HOSPITAL_COMMUNITY)
Admission: RE | Admit: 2012-02-25 | Discharge: 2012-02-25 | Disposition: A | Discharge status: Home / Self Care | Payer: Self-pay | Source: Ambulatory Visit | Attending: Internal Medicine | Admitting: Internal Medicine

## 2012-02-27 ENCOUNTER — Encounter (HOSPITAL_COMMUNITY)
Admission: RE | Admit: 2012-02-27 | Discharge: 2012-02-27 | Disposition: A | Discharge status: Home / Self Care | Payer: Self-pay | Source: Ambulatory Visit | Attending: Internal Medicine | Admitting: Internal Medicine

## 2012-03-02 ENCOUNTER — Encounter (HOSPITAL_COMMUNITY): Payer: Self-pay

## 2012-03-03 ENCOUNTER — Encounter (HOSPITAL_COMMUNITY): Payer: Self-pay

## 2012-03-05 ENCOUNTER — Encounter (HOSPITAL_COMMUNITY)
Admission: RE | Admit: 2012-03-05 | Discharge: 2012-03-05 | Disposition: A | Discharge status: Home / Self Care | Payer: Self-pay | Source: Ambulatory Visit | Attending: Internal Medicine | Admitting: Internal Medicine

## 2012-03-08 ENCOUNTER — Other Ambulatory Visit: Payer: Self-pay | Admitting: Internal Medicine

## 2012-03-09 ENCOUNTER — Encounter (HOSPITAL_COMMUNITY)
Admission: RE | Admit: 2012-03-09 | Discharge: 2012-03-09 | Disposition: A | Discharge status: Home / Self Care | Payer: Self-pay | Source: Ambulatory Visit | Attending: Internal Medicine | Admitting: Internal Medicine

## 2012-03-09 DIAGNOSIS — I509 Heart failure, unspecified: Secondary | ICD-10-CM | POA: Insufficient documentation

## 2012-03-10 ENCOUNTER — Encounter (HOSPITAL_COMMUNITY)
Admission: RE | Admit: 2012-03-10 | Discharge: 2012-03-10 | Disposition: A | Discharge status: Home / Self Care | Payer: Self-pay | Source: Ambulatory Visit | Attending: Internal Medicine | Admitting: Internal Medicine

## 2012-03-12 ENCOUNTER — Encounter (HOSPITAL_COMMUNITY)
Admission: RE | Admit: 2012-03-12 | Discharge: 2012-03-12 | Disposition: A | Discharge status: Home / Self Care | Payer: Self-pay | Source: Ambulatory Visit | Attending: Internal Medicine | Admitting: Internal Medicine

## 2012-03-16 ENCOUNTER — Encounter (HOSPITAL_COMMUNITY)
Admission: RE | Admit: 2012-03-16 | Discharge: 2012-03-16 | Disposition: A | Discharge status: Home / Self Care | Payer: Self-pay | Source: Ambulatory Visit | Attending: Internal Medicine | Admitting: Internal Medicine

## 2012-03-17 ENCOUNTER — Encounter (HOSPITAL_COMMUNITY)
Admission: RE | Admit: 2012-03-17 | Discharge: 2012-03-17 | Disposition: A | Discharge status: Home / Self Care | Payer: Self-pay | Source: Ambulatory Visit | Attending: Internal Medicine | Admitting: Internal Medicine

## 2012-03-18 ENCOUNTER — Other Ambulatory Visit: Payer: Self-pay | Admitting: Orthopedic Surgery

## 2012-03-18 DIAGNOSIS — M25511 Pain in right shoulder: Secondary | ICD-10-CM

## 2012-03-19 ENCOUNTER — Encounter (HOSPITAL_COMMUNITY): Payer: Self-pay

## 2012-03-23 ENCOUNTER — Encounter (HOSPITAL_COMMUNITY): Payer: Self-pay

## 2012-03-24 ENCOUNTER — Encounter (HOSPITAL_COMMUNITY): Payer: Self-pay

## 2012-03-26 ENCOUNTER — Encounter (HOSPITAL_COMMUNITY): Payer: Self-pay

## 2012-03-30 ENCOUNTER — Encounter (HOSPITAL_COMMUNITY): Payer: Self-pay

## 2012-03-31 ENCOUNTER — Encounter (HOSPITAL_COMMUNITY): Payer: Self-pay | Admitting: Cardiology

## 2012-03-31 ENCOUNTER — Observation Stay (HOSPITAL_COMMUNITY)
Admission: EM | Admit: 2012-03-31 | Discharge: 2012-04-03 | Disposition: A | Discharge status: Home / Self Care | Payer: No Typology Code available for payment source | Attending: Cardiology | Admitting: Cardiology

## 2012-03-31 ENCOUNTER — Encounter (HOSPITAL_COMMUNITY): Payer: Self-pay

## 2012-03-31 ENCOUNTER — Emergency Department (HOSPITAL_COMMUNITY): Payer: No Typology Code available for payment source

## 2012-03-31 DIAGNOSIS — Z95810 Presence of automatic (implantable) cardiac defibrillator: Secondary | ICD-10-CM | POA: Diagnosis present

## 2012-03-31 DIAGNOSIS — D696 Thrombocytopenia, unspecified: Secondary | ICD-10-CM | POA: Insufficient documentation

## 2012-03-31 DIAGNOSIS — R0602 Shortness of breath: Secondary | ICD-10-CM | POA: Insufficient documentation

## 2012-03-31 DIAGNOSIS — R0902 Hypoxemia: Secondary | ICD-10-CM | POA: Diagnosis present

## 2012-03-31 DIAGNOSIS — E876 Hypokalemia: Secondary | ICD-10-CM | POA: Insufficient documentation

## 2012-03-31 DIAGNOSIS — Z79899 Other long term (current) drug therapy: Secondary | ICD-10-CM | POA: Insufficient documentation

## 2012-03-31 DIAGNOSIS — Z9889 Other specified postprocedural states: Secondary | ICD-10-CM

## 2012-03-31 DIAGNOSIS — I10 Essential (primary) hypertension: Secondary | ICD-10-CM | POA: Diagnosis present

## 2012-03-31 DIAGNOSIS — I428 Other cardiomyopathies: Secondary | ICD-10-CM

## 2012-03-31 DIAGNOSIS — J811 Chronic pulmonary edema: Secondary | ICD-10-CM

## 2012-03-31 DIAGNOSIS — J984 Other disorders of lung: Secondary | ICD-10-CM

## 2012-03-31 DIAGNOSIS — I2510 Atherosclerotic heart disease of native coronary artery without angina pectoris: Secondary | ICD-10-CM | POA: Insufficient documentation

## 2012-03-31 DIAGNOSIS — I5022 Chronic systolic (congestive) heart failure: Secondary | ICD-10-CM | POA: Diagnosis present

## 2012-03-31 DIAGNOSIS — E785 Hyperlipidemia, unspecified: Secondary | ICD-10-CM | POA: Diagnosis present

## 2012-03-31 DIAGNOSIS — I5023 Acute on chronic systolic (congestive) heart failure: Secondary | ICD-10-CM | POA: Insufficient documentation

## 2012-03-31 DIAGNOSIS — R55 Syncope and collapse: Principal | ICD-10-CM | POA: Diagnosis present

## 2012-03-31 DIAGNOSIS — I509 Heart failure, unspecified: Secondary | ICD-10-CM | POA: Insufficient documentation

## 2012-03-31 DIAGNOSIS — I4891 Unspecified atrial fibrillation: Secondary | ICD-10-CM | POA: Diagnosis present

## 2012-03-31 HISTORY — DX: Atherosclerotic heart disease of native coronary artery without angina pectoris: I25.10

## 2012-03-31 HISTORY — DX: Hyperlipidemia, unspecified: E78.5

## 2012-03-31 HISTORY — DX: Presence of automatic (implantable) cardiac defibrillator: Z95.810

## 2012-03-31 HISTORY — DX: Essential (primary) hypertension: I10

## 2012-03-31 HISTORY — DX: Shortness of breath: R06.02

## 2012-03-31 HISTORY — DX: Unspecified atrial fibrillation: I48.91

## 2012-03-31 HISTORY — DX: Patent foramen ovale: Q21.12

## 2012-03-31 HISTORY — DX: Other specified postprocedural states: Z98.890

## 2012-03-31 HISTORY — DX: Atrial septal defect: Q21.1

## 2012-03-31 HISTORY — DX: Chronic systolic (congestive) heart failure: I50.22

## 2012-03-31 HISTORY — DX: Unspecified systolic (congestive) heart failure: I50.20

## 2012-03-31 HISTORY — DX: Presence of cardiac pacemaker: Z95.0

## 2012-03-31 HISTORY — DX: Other cardiomyopathies: I42.8

## 2012-03-31 LAB — CBC WITH DIFFERENTIAL/PLATELET
Basophils Relative: 1 % (ref 0–1)
Eosinophils Absolute: 0.3 10*3/uL (ref 0.0–0.7)
HCT: 37.1 % — ABNORMAL LOW (ref 39.0–52.0)
MCH: 30.7 pg (ref 26.0–34.0)
MCHC: 32.9 g/dL (ref 30.0–36.0)
MCV: 93.2 fL (ref 78.0–100.0)
Monocytes Relative: 7 % (ref 3–12)
Neutrophils Relative %: 72 % (ref 43–77)
Platelets: 94 10*3/uL — ABNORMAL LOW (ref 150–400)
RBC: 3.98 MIL/uL — ABNORMAL LOW (ref 4.22–5.81)
RDW: 16.9 % — ABNORMAL HIGH (ref 11.5–15.5)
WBC: 4.5 10*3/uL (ref 4.0–10.5)

## 2012-03-31 LAB — BASIC METABOLIC PANEL
BUN: 29 mg/dL — ABNORMAL HIGH (ref 6–23)
GFR calc Af Amer: 60 mL/min — ABNORMAL LOW (ref >90)
GFR calc non Af Amer: 51 mL/min — ABNORMAL LOW (ref >90)
Potassium: 3.4 mEq/L — ABNORMAL LOW (ref 3.5–5.1)

## 2012-03-31 LAB — CK TOTAL AND CKMB (NOT AT ARMC): Relative Index: INVALID (ref 0.0–2.5)

## 2012-03-31 MED ORDER — FUROSEMIDE 40 MG PO TABS
60.0000 mg | ORAL_TABLET | Freq: Every day | ORAL | Status: DC
  Administered 2012-04-01: 60 mg via ORAL
  Filled 2012-03-31 (×2): qty 1

## 2012-03-31 MED ORDER — ONDANSETRON HCL 4 MG/2ML IJ SOLN: 4.0000 mg | Freq: Four times a day (QID) | INTRAMUSCULAR | Status: DC | PRN

## 2012-03-31 MED ORDER — NIACIN ER (ANTIHYPERLIPIDEMIC) 500 MG PO TBCR
500.0000 mg | EXTENDED_RELEASE_TABLET | Freq: Every day | ORAL | Status: DC
  Administered 2012-04-01 – 2012-04-03 (×3): 500 mg via ORAL
  Filled 2012-03-31 (×3): qty 1

## 2012-03-31 MED ORDER — ASPIRIN EC 325 MG PO TBEC
325.0000 mg | DELAYED_RELEASE_TABLET | Freq: Every day | ORAL | Status: DC
  Administered 2012-04-01 – 2012-04-03 (×3): 325 mg via ORAL
  Filled 2012-03-31 (×4): qty 1

## 2012-03-31 MED ORDER — SODIUM CHLORIDE 0.9 % IJ SOLN: 3.0000 mL | INTRAMUSCULAR | Status: DC | PRN

## 2012-03-31 MED ORDER — TAMSULOSIN HCL 0.4 MG PO CAPS
0.4000 mg | ORAL_CAPSULE | Freq: Every day | ORAL | Status: DC
Start: 2012-03-31 — End: 2012-04-03
  Administered 2012-03-31 – 2012-04-02 (×3): 0.4 mg via ORAL
  Filled 2012-03-31 (×4): qty 1

## 2012-03-31 MED ORDER — SODIUM CHLORIDE 0.9 % IJ SOLN
3.0000 mL | Freq: Two times a day (BID) | INTRAMUSCULAR | Status: DC
  Administered 2012-04-01 – 2012-04-03 (×5): 3 mL via INTRAVENOUS

## 2012-03-31 MED ORDER — CARVEDILOL 6.25 MG PO TABS
6.2500 mg | ORAL_TABLET | Freq: Two times a day (BID) | ORAL | Status: DC
  Administered 2012-04-01 – 2012-04-03 (×5): 6.25 mg via ORAL
  Filled 2012-03-31 (×8): qty 1

## 2012-03-31 MED ORDER — FUROSEMIDE 40 MG PO TABS
60.0000 mg | ORAL_TABLET | Freq: Once | ORAL | Status: AC
  Administered 2012-03-31: 60 mg via ORAL
  Filled 2012-03-31: qty 1

## 2012-03-31 MED ORDER — NITROGLYCERIN 0.4 MG SL SUBL: 0.4000 mg | SUBLINGUAL_TABLET | SUBLINGUAL | Status: DC | PRN

## 2012-03-31 MED ORDER — ACETAMINOPHEN 325 MG PO TABS: 650.0000 mg | ORAL_TABLET | ORAL | Status: DC | PRN

## 2012-03-31 MED ORDER — POTASSIUM CHLORIDE CRYS ER 20 MEQ PO TBCR
40.0000 meq | EXTENDED_RELEASE_TABLET | Freq: Once | ORAL | Status: AC
  Administered 2012-03-31: 40 meq via ORAL
  Filled 2012-03-31: qty 2

## 2012-03-31 MED ORDER — FUROSEMIDE 40 MG PO TABS: 60.0000 mg | ORAL_TABLET | Freq: Every day | ORAL | Status: DC

## 2012-03-31 MED ORDER — POTASSIUM CHLORIDE CRYS ER 20 MEQ PO TBCR
20.0000 meq | EXTENDED_RELEASE_TABLET | Freq: Every day | ORAL | Status: DC
  Administered 2012-04-01: 20 meq via ORAL
  Filled 2012-03-31: qty 1

## 2012-03-31 MED ORDER — SODIUM CHLORIDE 0.9 % IV SOLN: 250.0000 mL | INTRAVENOUS | Status: DC | PRN

## 2012-03-31 NOTE — ED Notes (Signed)
Unable to get [employer] to send interrogation results, I personally spoke with Dr Myrtis Ser and he is aware. [employer] staff will be here between 7a-8a to interrogate pacemaker.

## 2012-03-31 NOTE — ED Provider Notes (Signed)
History     CSN: [id number]  Arrival date & time 03/31/12  1446   First MD Initiated Contact with Patient 03/31/12 1505      Chief Complaint  Patient presents with  . Loss of Consciousness  . Shortness of Breath    (Consider location/radiation/quality/duration/timing/severity/associated sxs/prior treatment) HPI Comments: Patient got out of his car at a business and felt dizziness. He had a syncopal event. Found to be hypoxic on arrival - O2 sat was 84%, improved to mid 90s on 2L.   Patient is a 78 y.o. male presenting with syncope and shortness of breath. The history is provided by the patient.  Loss of Consciousness This is a recurrent problem. The current episode started today. Episode frequency: once. The problem has been resolved. Pertinent negatives include no abdominal pain, chest pain, coughing, diaphoresis, fever, nausea or vomiting. Nothing aggravates the symptoms. He has tried nothing for the symptoms.  Shortness of Breath  Associated symptoms include shortness of breath. Pertinent negatives include no chest pain, no fever and no cough.    Past Medical History  Diagnosis Date  . Automatic implantable cardiac defibrillator in situ     BiV pacer  . Secondary cardiomyopathy, unspecified     nicm ef 30-35%,nonob cad-cath 2006,echo 2/12:ef30-35%.mod lvh,inf hk,mild-mod lae and mild rae,pasp 35  . Congestive heart failure, unspecified   . Unspecified essential hypertension   . History of atrial fibrillation     Past Surgical History  Procedure Date  . Rt. shoulder   . Bilateral knee arthroscopy   . Colonoscopy with polypectomy   . Intraoperative transesophageal echocardiogram   . Median sterotomy for mitral valve repair   . Implantation of a dual chamber biventricular icd     Family History  Problem Relation Age of Onset  . Heart disease Neg Hx     Rheumatic or valvular heart disease   . Cardiomyopathy Neg Hx     History  Substance Use Topics  . Smoking  status: Former Smoker    Quit date: 05/06/1961  . Smokeless tobacco: Never Used  . Alcohol Use: No      Review of Systems  Constitutional: Negative for fever and diaphoresis.  Respiratory: Positive for shortness of breath. Negative for cough.   Cardiovascular: Positive for syncope. Negative for chest pain.  Gastrointestinal: Negative for nausea, vomiting and abdominal pain.  All other systems reviewed and are negative.    Allergies  Review of patient's allergies indicates no known allergies.  Home Medications   Current Outpatient Rx  Name  Route  Sig  Dispense  Refill  . ASPIRIN EC 325 MG PO TBEC   Oral   Take 325 mg by mouth daily.           Marland Kitchen CARVEDILOL 6.25 MG PO TABS   Oral   Take 1 tablet (6.25 mg total) by mouth 2 (two) times daily with a meal.   180 tablet   5   . FUROSEMIDE 40 MG PO TABS   Oral   Take 60 mg by mouth daily.         Marland Kitchen NIACIN ER (ANTIHYPERLIPIDEMIC) 500 MG PO TBCR   Oral   Take 500 mg by mouth daily.           Marland Kitchen POTASSIUM CHLORIDE CRYS ER 10 MEQ PO TBCR   Oral   Take 10 mEq by mouth daily.         Marland Kitchen TAMSULOSIN HCL 0.4 MG PO CAPS   Oral  Take 0.4 mg by mouth at bedtime.             BP 137/85  Pulse 60  Temp 97.6 F (36.4 C) (Oral)  Resp 19  SpO2 98%  Physical Exam  Nursing note and vitals reviewed. Constitutional: He is oriented to person, place, and time. He appears well-developed and well-nourished. No distress.  HENT:  Head: Normocephalic and atraumatic.  Mouth/Throat: No oropharyngeal exudate.  Eyes: EOM are normal. Pupils are equal, round, and reactive to light.  Neck: Normal range of motion. Neck supple.  Cardiovascular: Normal rate and regular rhythm.  Exam reveals no friction rub.   No murmur heard. Pulmonary/Chest: Effort normal. No respiratory distress. He has no wheezes. He has rales (mild, bilateral bases).  Abdominal: He exhibits no distension. There is no tenderness. There is no rebound.    Musculoskeletal: Normal range of motion. He exhibits no edema.  Neurological: He is alert and oriented to person, place, and time. No cranial nerve deficit. He exhibits normal muscle tone. Coordination normal.  Skin: He is not diaphoretic.    ED Course  Procedures (including critical care time)  Labs Reviewed  CBC WITH DIFFERENTIAL - Abnormal; Notable for the following:    RBC 3.98 (*)     Hemoglobin 12.2 (*)     HCT 37.1 (*)     RDW 16.9 (*)     Platelets 94 (*)  PLATELET COUNT CONFIRMED BY SMEAR   Lymphs Abs 0.6 (*)     Eosinophils Relative 6 (*)     All other components within normal limits  BASIC METABOLIC PANEL - Abnormal; Notable for the following:    Potassium 3.4 (*)     Glucose, Bld 175 (*)     BUN 29 (*)     GFR calc non Af Amer 51 (*)     GFR calc Af Amer 60 (*)     All other components within normal limits  PRO B NATRIURETIC PEPTIDE - Abnormal; Notable for the following:    Pro B Natriuretic peptide (BNP) 9711.0 (*)     All other components within normal limits  TROPONIN I  TROPONIN I  CK TOTAL AND CKMB  BASIC METABOLIC PANEL  TROPONIN I  CK TOTAL AND CKMB   Dg Chest 1 View  03/31/2012  *RADIOLOGY REPORT*  Clinical Data: Dizziness.  Hypoxia, CHF.  CHEST - 1 VIEW  Comparison: 01/28/2011  Findings: Left AICD remains in place, unchanged.  Prior median sternotomy and valve replacement.  Cardiomegaly with vascular congestion.  No overt edema.  No confluent opacities or effusions. No acute bony abnormality.  Probable bibasilar scarring, stable.  IMPRESSION: Cardiomegaly with vascular congestion.   Original Report Authenticated By: Charlett Nose, M.D.      1. Syncope      Date: 03/31/2012  Rate: 58  Rhythm: paced with PVCs  QRS Axis: indeterminate  Intervals: QT prolonged - 122, similar to previous  ST/T Wave abnormalities: normal  Conduction Disutrbances:paced  Narrative Interpretation:   Old EKG Reviewed: unchanged    MDM   28M with hx of CHF, Afib,  cardiomyopathy, HTN presents with syncope. History of same episodes "when his oxygen gets low." Had recent dosage change of his lasix. Hypoxic with EMS, improved on 2L [place]. Here alert and oriented, states he feels well. Denies any chest pain today. Patient relaxing comfortably, neurologically intact. Bibasilar rales without tachypnea. No previous PE or blood clot per him, not on anticoagulants. Paced rhythm on the monitor, satting  96% on 2 L [place] with good waveform. EKG similar to previous with paced rhythm, however multiple PVCs when I am talking to him. Will check labs including BNP and troponin.  BNP elevated, concern for CHF exacerbation. Troponin normal. Cardiology admitting.       Elwin Mocha, MD 04/01/12 [phone]

## 2012-03-31 NOTE — H&P (Signed)
History and Physical  Patient ID: Arthur Armstrong MRN: [id number], SOB: 07-Jan-1927 79 y.o. Date of Encounter: 03/31/2012, 6:31 PM  Primary Physician: Gaspar Garbe, MD Primary Cardiologist: Dr. Ladona Ridgel  Chief Complaint: Syncope  HPI: 78 y.o. male w/ PMHx significant for Chronic Systolic CHF, NICM (EF 30-35%, s/p BiV ICD), s/p MV repair and PFO closure in 7/06, nonobstructive CAD by cath in 7/06 (pLM 20%, pLAD 40%; mCFX 20%), A.fib, HTN, and HLD who presented to [facility] on 03/31/2012 after a syncopal episode.  He was seen by Dr. Ladona Ridgel in clinic on 8/12 at which time it was noted he was having dizziness for which his lasix was decreased. He was again seen on 02/04/12 and noted improvement in dizziness, but c/o shortness of breath, LE edema, and weight gain (5lbs). He was instructed to increase lasix for 3-4 days and cont to monitor weight. He reports feeling "great" recently without complaints of worsening shortness of breath or edema, weight gain, orthopnea, PND, chest pain, palpitations, fever, chills, change in bowel/bladder, or melena/hematochezia. Today he drove to the alteration shop and got out of his car when he suddenly felt dizzy so he leaned on his car. He thinks he passed out, but was found leaning on his car by the store employees where he was awake. They helped him walk into the store and sit down. He says he was mildly short of breath. Upon EMS arrival he was hypoxic with O2 sats 84% which improved to the mid 90s on 2L [place]. In the ED he is alert, feels well, and hemodynamically stable. EKG shows V-paced rjythm w/ underlying atrial fibrillation 58bpm. CXR is pending. Labs are significant for normal troponin, pBNP 9711, K+ 3.4, glucose 175, BUN/Crt 29/1.25, Plts 94. ICD has not been interrogated.    Past Medical History  Diagnosis Date  . Automatic implantable cardiac defibrillator in situ     BiV pacer  . Secondary cardiomyopathy, unspecified     nicm ef 30-35%,nonob  cad-cath 2006,echo 2/12:ef30-35%.mod lvh,inf hk,mild-mod lae and mild rae,pasp 35  . Congestive heart failure, unspecified   . Unspecified essential hypertension   . History of atrial fibrillation     Echo 2012 Study Conclusions: - Left ventricle: The cavity size was normal. Wall thickness was   increased in a pattern of moderate LVH. Systolic function was   moderately to severely reduced. The estimated ejection fraction   was in the range of 30% to 35%. There is hypokinesis of the   inferior myocardium.  - Mitral valve: Calcified annulus. Mildly thickened leaflets . - Left atrium: The atrium was mildly to moderately dilated. - Right ventricle: The cavity size was mildly dilated. - Right atrium: The atrium was mildly dilated. - Pulmonary arteries: PA peak pressure: 35mm Hg (S).  Cath 2006 HEMODYNAMICS: Right atrium mean of 60 mmHg. Right ventricle 66/13 mmHg. Pulmonary artery 65/30 mmHg. Pulmonary capillary wedge pressure mean of 32  mmHg with V-waves to approximately 45 mmHg. Aorta 135/92 mmHg. Left  Ventricle 136/31 mmHg. Cardiac output 4.8 by thermodilution method. Cardiac  index 2.4 by thermodilution method. Arterial saturation is 89% on room air.  Pulmonary artery saturation 58% on room air.  ANGIOGRAPHIC FINDINGS:  1. Left main coronary artery is free of significant flow-limiting coronary  atherosclerosis. There is approximately 20% stenosis in the proximal  segment.  2. The left anterior descending is a large caliber vessel with three  diagonal branches, the first of which is the largest and bifurcates. There are  minor luminal irregularities throughout, although in the proximal left anterior descending there is approximately 40% tapering.  3. The circumflex coronary artery is a large caliber vessel with four obtuse marginal branches. There are minor luminal irregularities noted throughout this system with 20% stenoses in the mid and distal segment  4. The right coronary artery is a large  dominant vessel with large posterior descending branch. Minor luminal irregularities are noted throughout.  5. Left ventriculography was performed in the RAO projection and revealed ejection fraction approximately 25% associated with 3+ mitral regurgitation  DIAGNOSES:  1. Mild coronary atherosclerosis.  2. Left ventricular ejection fraction of approximately 25% with a 3+ mitral regurgitation and elevated left ventricular end-diastolic pressure of 31 mmHg.  3. Pulmonary hypertension with a pulmonary artery systolic pressure of 65 mmHg. The mean pulmonary capillary wedge pressure is 32 mmHg with V- waves to 45 mmHg. The patient is also hypoxic on room air with arterial saturation of 89%.  DISCUSSION: I reviewed the results of the procedure with Dr. Myrtis Ser. At this point, plan will be to proceed with a transesophageal echocardiogram to better define the etiology of the mitral regurgitation as this looks to be fairly significant and symptom inducing. The standard surface study was technically difficult. In the meanwhile, will continue to diurese and make further plans when more information is available.   Surgical History:  Past Surgical History  Procedure Date  . Rt. shoulder   . Bilateral knee arthroscopy   . Colonoscopy with polypectomy   . Intraoperative transesophageal echocardiogram   . Median sterotomy for mitral valve repair   . Implantation of a dual chamber biventricular icd      Home Meds: Medication Sig  aspirin EC 325 MG EC tablet Take 325 mg by mouth daily.    carvedilol (COREG) 6.25 MG tablet Take 1 tablet (6.25 mg total) by mouth 2 (two) times daily with a meal.  furosemide (LASIX) 40 MG tablet Take 60 mg by mouth daily.  niacin (NIASPAN) 500 MG CR tablet Take 500 mg by mouth daily.    potassium chloride (K-DUR,KLOR-CON) 10 MEQ tablet Take 10 mEq by mouth daily.  Tamsulosin HCl (FLOMAX) 0.4 MG CAPS Take 0.4 mg by mouth at bedtime.      Allergies: No Known Allergies  History    Social History  . Marital Status: Married    Spouse Name: N/A    Number of Children: 6  . Years of Education: N/A   Occupational History  . 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 [employer] G[employer] in his church    Social History Main Topics  . Smoking status: Former Smoker    Quit date: 05/06/1961  . Smokeless tobacco: Never Used  . Alcohol Use: No  . Drug Use: Not on file  . Sexually Active: Not on file   Other Topics Concern  . Not on file   Social History Narrative   Lives with his wife in [place].Has 6 grown children.Remains quite active.     Family History  Problem Relation Age of Onset  . Heart disease Neg Hx     Rheumatic or valvular heart disease   . Cardiomyopathy Neg Hx     Review of Systems: General: negative for chills, fever, night sweats or weight changes.  Cardiovascular: negative for chest pain, shortness of breath, dyspnea on exertion, edema, orthopnea, palpitations, paroxysmal nocturnal dyspnea  Dermatological: negative for rash Respiratory: negative for cough or wheezing Urologic: negative for hematuria Abdominal:  negative for nausea, vomiting, diarrhea, bright red blood per rectum, melena, or hematemesis Neurologic: (+) syncope; negative for visual changes or dizziness All other systems reviewed and are otherwise negative except as noted above.  Labs:   Component Value Date   WBC 4.5 03/31/2012   HGB 12.2* 03/31/2012   HCT 37.1* 03/31/2012   MCV 93.2 03/31/2012   PLT 94* 03/31/2012    Lab 03/31/12 1532  NA 136  K 3.4*  CL 101  CO2 26  BUN 29*  CREATININE 1.25  CALCIUM 8.8  GLUCOSE 175*   Basename 03/31/12 1535  TROPONINI <0.30     03/31/2012 15:35  Pro B Natriuretic peptide (BNP) 9711.0 (H)   Radiology/Studies:  CXR - pending   EKG: 03/31/12 @ 1501 - V-paced w/ underlying atrial fibrillation 58bpm  Physical Exam:  Blood pressure 112/76, pulse 63, temperature 97.6 F (36.4 C), temperature source Oral, resp. rate 19,  SpO2 98.00%. General: Well developed, elderly male, in no acute distress. Head: Normocephalic, atraumatic, sclera non-icteric, nares are without discharge Neck: Supple. Negative for carotid bruits. (+) mild JVD. Lungs: Fine bibasilar rales. No wheezes or rhonchi. Breathing is unlabored on supplemental O2. Heart: RRR with S1 S2. No murmurs, rubs, or gallops appreciated. Abdomen: Soft, non-tender, non-distended with normoactive bowel sounds. No rebound/guarding. No obvious abdominal masses. Msk:  Strength and tone appear normal for age. Extremities: 1+ bilat ankle edema. No clubbing or cyanosis. Distal pedal pulses are intact and equal bilaterally. Neuro: Alert and oriented X 3. Moves all extremities spontaneously. Psych:  Responds to questions appropriately with a normal affect.    ASSESSMENT AND PLAN:   78 y.o. male w/ PMHx significant for Chronic Systolic CHF, NICM (EF 30-35%, s/p BiV ICD), s/p MV repair and PFO closure in 7/06, nonobstructive CAD by cath in 7/06 (pLM 20%, pLAD 40%; mCFX 20%), A.fib, HTN, and HLD who presented to [facility] on 03/31/2012 after a syncopal episode.  1. Syncope 2. Acute on Chronic Systolic CHF 3. NICM s/ BiV ICD 4. A.fib, not on anticoagulation 5. Hypokalemia 6. Hypertension 7. Nonobstructive CAD by cath 2006   Signed, HOPE, JESSICA PA-C 03/31/2012, 6:31 PM Patient seen and examined. I agree with the assessment and plan as detailed above. See also my additional thoughts below.   This patient has been followed for many years by Dr. Ladona Ridgel. He has an ICD in place for his left ventricular dysfunction. It is my understanding that the ICD has never fired. His diuretics were adjusted somewhat in early October, 2013. The etiology of today's spell is not yet clear. The patient says that the event is similar to what he has had in the past. However I cannot find an event such as is documented in the past. He says that he began to feel dizzy when he got out  of his car. He then leaned over his car hoping that the episode would pass. He continued to lean over his car. He cannot remember all the events. It seems that others came to help and took him from leaning over his car in the side. EMS arrived and documented his O2 sat was 84. Oxygen was placed. Upon arrival in the emergency room his O2 sat has been 90 or higher with or without oxygen. He says he did not feel any palpitations before the event. He cannot comment on any type of ICD shock. However he does not remember 1 and there is no documentation of one.  He says that he has  been feeling fine this week.  We will admit him for observation. His ICD will be interrogated. We'll obtain orthostatic blood pressure checks. His overall volume status at this time may be mildly increased. However we do not have a chest x-ray yet. Chest x-ray will be obtained. If the x-ray appears wet we will proceed with very mild diuresis. The rest of the evaluation will be based on his monitor rhythm and the assessment of his ICD and stabilization of his volume status.Potassium will be treated.  At this point the patient is not on an ACE inhibitor or ARB. We will check with Dr. Ladona Ridgel to obtain older history concerning this question. Also the patient is not on Coumadin or an equivalent. He is on aspirin. He has had falls in the past. We will check with Dr. Ladona Ridgel to learn more about the approach to his anticoagulation.  Willa Rough, MD, [facility] 03/31/2012 7:14 PM

## 2012-03-31 NOTE — ED Notes (Signed)
Per report from EMS pt was at a business and was witnessed to slump over onto his car but remained standing.  Employees went out to check on him and he was noted to have severe "blue lips".  He then became weak and slumped over and became unresponsive for approx 1 min. He then awoke and was alert and oriented.   On EMS arrival pt was sating 84% on RA and after 2L/min of O2 via [place] sats improved to 94%.  Pt is a/ox 3.  No distress noted.

## 2012-04-01 ENCOUNTER — Encounter (HOSPITAL_COMMUNITY): Payer: Self-pay | Admitting: General Practice

## 2012-04-01 DIAGNOSIS — J984 Other disorders of lung: Secondary | ICD-10-CM

## 2012-04-01 DIAGNOSIS — J811 Chronic pulmonary edema: Secondary | ICD-10-CM

## 2012-04-01 DIAGNOSIS — R0902 Hypoxemia: Secondary | ICD-10-CM

## 2012-04-01 DIAGNOSIS — I509 Heart failure, unspecified: Secondary | ICD-10-CM

## 2012-04-01 LAB — BASIC METABOLIC PANEL
Calcium: 8.8 mg/dL (ref 8.4–10.5)
GFR calc Af Amer: 76 mL/min — ABNORMAL LOW (ref >90)
GFR calc non Af Amer: 66 mL/min — ABNORMAL LOW (ref >90)
Potassium: 3.5 mEq/L (ref 3.5–5.1)
Sodium: 139 mEq/L (ref 135–145)

## 2012-04-01 LAB — TROPONIN I: Troponin I: <0.3 ng/mL (ref <0.30)

## 2012-04-01 LAB — CK TOTAL AND CKMB (NOT AT ARMC)
CK, MB: 2.8 ng/mL (ref 0.3–4.0)
Relative Index: INVALID (ref 0.0–2.5)
Total CK: 46 U/L (ref 7–232)

## 2012-04-01 MED ORDER — POTASSIUM CHLORIDE CRYS ER 20 MEQ PO TBCR
20.0000 meq | EXTENDED_RELEASE_TABLET | Freq: Three times a day (TID) | ORAL | Status: AC
  Administered 2012-04-01 – 2012-04-02 (×2): 20 meq via ORAL
  Filled 2012-04-01: qty 1

## 2012-04-01 MED ORDER — FUROSEMIDE 10 MG/ML IJ SOLN
INTRAMUSCULAR | Status: AC
  Administered 2012-04-01: 80 mg via INTRAVENOUS
  Filled 2012-04-01: qty 8

## 2012-04-01 MED ORDER — FUROSEMIDE 10 MG/ML IJ SOLN
80.0000 mg | Freq: Two times a day (BID) | INTRAMUSCULAR | Status: AC
  Administered 2012-04-01 – 2012-04-02 (×2): 80 mg via INTRAVENOUS
  Filled 2012-04-01: qty 8

## 2012-04-01 NOTE — Consult Note (Signed)
Patient: Arthur Armstrong DOB: 08-15-1926 Date of Admission: 03/31/2012            Pulmonary consult  Date of Consult: 04/01/2012 MD requesting consult:  Graciela Husbands  Reason for consult:  hypoxia  HPI -  78yo male with extensive PMH including chronic systolic CHF, non-ischemic cardiomyopathy (EF 30-35%), CAD, Afib, PFO (s/p repair 2006) presented 11/26 with near syncope and SOB.  Found to be mildly hypoxic with O2 sats 90-92 despite 2-3L O2 and PCCM consulted.  Per pt/daughter he has been progressively SOB over the last several weeks to months.  He can walk around the house fairly well but has had increasingly poor activity tolerance.  He does have occasional non productive cough but denies BLE edema (This has actually been better over last several weeks after change in Rx).  Remote hx smoking (smoked 1ppd x 5 years >65 years ago) and no obvious occupational exposure.  Per daughter had PFT's done here in August and was told he did not have COPD.   Allergies:  No Known Allergies   PMH: Past Medical History  Diagnosis Date  . Automatic implantable cardiac defibrillator in situ     BiV pacer  . NICM (nonischemic cardiomyopathy)     nicm ef 30-35%,nonob cad-cath 2006,echo 2/12:ef30-35%.mod lvh,inf hk,mild-mod lae and mild rae,pasp 35  . Systolic CHF   . Unspecified essential hypertension   . Atrial fibrillation   . S/P mitral valve repair     2006  . HTN (hypertension)   . HLD (hyperlipidemia)   . Coronary artery disease, non-occlusive     cath 2006 pLM 20%, pLAD 40%; mCFX 20%  . PFO (patent foramen ovale)     s/p repair 2006  . CHF (congestive heart failure)   . ICD (implantable cardiac defibrillator) in place     BIV ICD  . Pacemaker     BIV ICD  . Shortness of breath     Home meds: Medications Prior to Admission  Medication Sig Dispense Refill  . aspirin EC 325 MG EC tablet Take 325 mg by mouth daily.        . carvedilol (COREG) 6.25 MG tablet Take 1 tablet (6.25 mg total) by mouth  2 (two) times daily with a meal.  180 tablet  5  . furosemide (LASIX) 40 MG tablet Take 60 mg by mouth daily.      . niacin (NIASPAN) 500 MG CR tablet Take 500 mg by mouth daily.        . potassium chloride (K-DUR,KLOR-CON) 10 MEQ tablet Take 10 mEq by mouth daily.      . Tamsulosin HCl (FLOMAX) 0.4 MG CAPS Take 0.4 mg by mouth at bedtime.           Social Hx: History   Social History  . Marital Status: Married    Spouse Name: N/A    Number of Children: 6  . Years of Education: N/A   Occupational History  . 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 [employer] in his church    Social History Main Topics  . Smoking status: Former Smoker    Quit date: 05/06/1961  . Smokeless tobacco: Never Used  . Alcohol Use: No  . Drug Use: No  . Sexually Active: Not Currently   Other Topics Concern  . Not on file   Social History Narrative   Lives with his wife in [place].Has 6 grown children.Remains quite active.     Family  Hx: Family History  Problem Relation Age of Onset  . Heart disease Neg Hx     Rheumatic or valvular heart disease   . Cardiomyopathy Neg Hx      ROS: Review of Systems - Per HPI - all systems reviewed and were neg.   Filed Vitals:   04/01/12 1348 04/01/12 1349 04/01/12 1350 04/01/12 1355  BP: 112/70 106/63 112/66 113/62  Pulse: 58 62 62 61  Temp: 98 F (36.7 C) 98 F (36.7 C) 98 F (36.7 C) 98 F (36.7 C)  TempSrc: Oral     Resp: 18 18 18 18   Height:      Weight:      SpO2: 94% 91% 92% 91%    chest X-ray Dg Chest 1 View  03/31/2012  *RADIOLOGY REPORT*  Clinical Data: Dizziness.  Hypoxia, CHF.  CHEST - 1 VIEW  Comparison: 01/28/2011  Findings: Left AICD remains in place, unchanged.  Prior median sternotomy and valve replacement.  Cardiomegaly with vascular congestion.  No overt edema.  No confluent opacities or effusions. No acute bony abnormality.  Probable bibasilar scarring, stable.  IMPRESSION: Cardiomegaly with vascular congestion.    Original Report Authenticated By: Charlett Nose, M.D.      CBC    Component Value Date/Time   WBC 4.5 03/31/2012 1532   RBC 3.98* 03/31/2012 1532   HGB 12.2* 03/31/2012 1532   HCT 37.1* 03/31/2012 1532   PLT 94* 03/31/2012 1532   MCV 93.2 03/31/2012 1532   MCH 30.7 03/31/2012 1532   MCHC 32.9 03/31/2012 1532   RDW 16.9* 03/31/2012 1532   LYMPHSABS 0.6* 03/31/2012 1532   MONOABS 0.3 03/31/2012 1532   EOSABS 0.3 03/31/2012 1532   BASOSABS 0.0 03/31/2012 1532     BMET    Component Value Date/Time   NA 139 04/01/2012 0330   K 3.5 04/01/2012 0330   CL 105 04/01/2012 0330   CO2 26 04/01/2012 0330   GLUCOSE 147* 04/01/2012 0330   BUN 23 04/01/2012 0330   CREATININE 1.02 04/01/2012 0330   CALCIUM 8.8 04/01/2012 0330   GFRNONAA 66* 04/01/2012 0330   GFRAA 76* 04/01/2012 0330     ABG    Component Value Date/Time   TCO2 24 06/16/2010 2212      EXAM: General: pleasant elderly male, NAD in bed  Neuro:  Awake, alert, appropriate, MAE CV: s1s2 irreg  PULM: resps even non labored on [place], diffuse fine crackles to nipple line GI: abd soft, +bs Extremities: warm and dry, scant BLE edema    IMPRESSION/ PLAN:  Dyspnea/hypoxemia -- likely somewhat chronic.  Previous PFT's show no evidence obstructive disease, no response to BD.  Restrictive disease only which is consistent with this being all pulmonary edema.  Would likely benefit from home O2.   PLAN -  Cont aggressive diuresis per cards  Ambulatory desat - if Sats drop <88% will need home O2 2-3L [place] No need BD  Cont cardiac rehab as able   PCCM signing off please call back if needed   [facility], NP 04/01/2012  3:20 PM Pager: [phone]  *Care during the described time interval was provided by me and/or other providers on the critical care team. I have reviewed this patient's available data, including medical history, events of note, physical examination and test results as part of my  evaluation.  Patient's PFTs reviewed, revealed restrictive pattern consistent with pulmonary edema with lower DLCO.  After evaluation, the patient will likely need O2 to relieve pressure of  the RV so would recommend an ambulatory desat study, if O2 sat is <=90% with activity would qualify for home O2 and would recommend use continuously.  There was no response to bronchodilators so would not recommend use.  Patient would benefit from pulmonary f/u in the clinic.  Please arrange for f/u post discharge.  PCCM will be available on a PRN bases.  Patient seen and examined, agree with above note.  I dictated the care and orders written for this patient under my direction.  Koren Bound, M.D. [phone]

## 2012-04-01 NOTE — Care Management Note (Signed)
    Page 1 of 2   04/03/2012     2:26:55 PM   CARE MANAGEMENT NOTE 04/03/2012  Patient:  Arthur Armstrong, Arthur Armstrong   Account Number:  [id number]  Date Initiated:  04/01/2012  Documentation initiated by:  Donn Pierini  Subjective/Objective Assessment:   pt admitted with syncope     Action/Plan:   PTA pt lived at home- NCM to follow for pt progression and d/c needs   Anticipated DC Date:  04/01/2012   Anticipated DC Plan:  HOME/SELF CARE      DC Planning Services  CM consult      PAC Choice  DURABLE MEDICAL EQUIPMENT   Choice offered to / List presented to:     DME arranged  [facility]      DME agency  [facility]        Status of service:  Completed, signed off Medicare Important Message given?   (If response is "NO", the following Medicare IM given date fields will be blank) Date Medicare IM given:   Date Additional Medicare IM given:    Discharge Disposition:  HOME/SELF CARE  Per UR Regulation:  Reviewed for med. necessity/level of care/duration of stay  If discussed at Long Length of Stay Meetings, dates discussed:    Comments:  04/03/12- 1000- Donn Pierini RN, BSN [phone] Pt for discharge today with home O2- call made to Darrian with [facility]- O2 to be delivered to pt's room prior to discharge.   04-01-12 1414 Tomi Bamberger, RN,BSN [phone] CM did speak to pt and family about possible oxygen for home.  If they need 02 they will choose [facility] for services. MD to consult pulmonary and plan is to f/u in am to see if need 02. Pt will benefit from [facility] for disease management. CM will continue to monitor for additional needs.

## 2012-04-01 NOTE — Progress Notes (Signed)
He remains with low 02 sats and lungs w diffuse creackles  I am not sure how much is heart failure some certainly wth hx, diuretic responsiveness and BNP.  I wonder if there is not a pulmonary component and will ask pulm to weigh in, either as inpt or out pt   Will hold overnight and continue diuresis

## 2012-04-01 NOTE — ED Provider Notes (Signed)
I saw and evaluated the patient, reviewed the resident's note and I agree with the findings and plan. I saw the patient along with Dr. Gwendolyn Grant.  The patient presents after a syncopal episode that occurred earlier today.  He was getting into his car when he became faint and lost consciousness.  This lasted for several seconds and woke later after he had been assisted into the store.  He has an extensive pmh including chf, afib, cardiomyopathy and has a pacer defibrillator.    On exam, the patient is afebrile and the vitals are stable.  The heart and lung exam is unremarkable and the abdomen is benign.  The extremities are without edema.    Workup was initiated including ekg, labs which were unchanged.  He has remained stable while here. As he presents with syncope and a significant cardiac history, we have consulted cardiology who will admit the patient for observation and further workup.   Geoffery Lyons, MD 04/01/12 [phone]

## 2012-04-01 NOTE — Progress Notes (Signed)
Patient Name: Arthur Armstrong      SUBJECTIVE: Seen for syncope  He has a history of a nonischemic cardiomyopathy prior in the repair and PFO closure. He is status post CRT-D with persistent atrial fibrillation and 85% pacing.  He has a history of orthostatic intolerance of which he has no recollection. Thankfully his daughter was there; she had been with him on vacation last week and he had multiple episodes of orthostatic lightheadedness.  This occurs in the mornings and all day long upon standing. He has had major falls in the last year associated with what she describes as fracture of his pelvis bruising of his kidney and laceration of his liver. He is not on anticoagulation; perhaps that is the reason.  He has trouble over recent months with shortness of breath balanced against any dizziness by titrated used of diuretics. He has been wearing knee high support stockings.  Past Medical History  Diagnosis Date  . Automatic implantable cardiac defibrillator in situ     BiV pacer  . NICM (nonischemic cardiomyopathy)     nicm ef 30-35%,nonob cad-cath 2006,echo 2/12:ef30-35%.mod lvh,inf hk,mild-mod lae and mild rae,pasp 35  . Systolic CHF   . Unspecified essential hypertension   . Atrial fibrillation   . S/P mitral valve repair     2006  . HTN (hypertension)   . HLD (hyperlipidemia)   . Coronary artery disease, non-occlusive     cath 2006 pLM 20%, pLAD 40%; mCFX 20%  . PFO (patent foramen ovale)     s/p repair 2006  . CHF (congestive heart failure)   . ICD (implantable cardiac defibrillator) in place     BIV ICD  . Pacemaker     BIV ICD  . Shortness of breath     PHYSICAL EXAM Filed Vitals:   03/31/12 2302 03/31/12 2304 03/31/12 2307 04/01/12 0500  BP: 128/79 138/88 150/79 128/90  Pulse: 63 65 65 64  Temp:    98.5 F (36.9 C)  TempSrc:    Oral  Resp:    18  Height:      Weight:    167 lb (75.751 kg)  SpO2: 95% 96% 95% 97%    General appearance: alert, cooperative  and no distress Neck: no adenopathy, no carotid bruit, supple, symmetrical, trachea midline, thyroid not enlarged, symmetric, no tenderness/mass/nodules and JVP aobut 7-8 : External jugulars prominent Lungs: diffuse crackles Heart: S1, S2 normal and systolic murmur: holosystolic 2/6, crescendo at 2nd left intercostal space Abdomen: soft, non-tender; bowel sounds normal; no masses,  no organomegaly Extremities: extremities normal, atraumatic, no cyanosis or edema Pulses: 2+ and symmetric Skin: Skin color, texture, turgor normal. No rashes or lesions Neurologic:  Grossly normal. While he is alert and oriented to year, he does not recall of events that his daughter clearly recalls from last week TELEMETRY: Reviewed telemetry pt in afib with some pvcs and intrinisic ocnduction   Intake/Output Summary (Last 24 hours) at 04/01/12 1306 Last data filed at 04/01/12 0933  Gross per 24 hour  Intake    495 ml  Output   2225 ml  Net  -1730 ml    LABS: Basic Metabolic Panel:  Lab 04/01/12 1308 03/31/12 1532  NA 139 136  K 3.5 3.4*  CL 105 101  CO2 26 26  GLUCOSE 147* 175*  BUN 23 29*  CREATININE 1.02 1.25  CALCIUM 8.8 8.8  MG -- --  PHOS -- --   Cardiac Enzymes:  Basename 04/01/12 0330 03/31/12  2156 03/31/12 1535  CKTOTAL 46 57 --  CKMB 2.8 3.4 --  CKMBINDEX -- -- --  TROPONINI <0.30 <0.30 <0.30   CBC:  Lab 03/31/12 1532  WBC 4.5  NEUTROABS 3.2  HGB 12.2*  HCT 37.1*  MCV 93.2  PLT 94*   PROTIME: No results found for this basename: LABPROT:3,INR:3 in the last 72 hours Liver Function Tests: No results found for this basename: AST:2,ALT:2,ALKPHOS:2,BILITOT:2,PROT:2,ALBUMIN:2 in the last 72 hours No results found for this basename: LIPASE:2,AMYLASE:2 in the last 72 hours BNP: BNP (last 3 results)  Basename 03/31/12 1535  PROBNP 9711.0*   D-Dimer:   Device Interrogation no episodes of ventricular arrhythmias were identified. The patient has the 85% biventricular  pacing; dilated some of this is used in the room number is lower  ASSESSMENT AND PLAN:  Patient Active Hospital Problem List: HYPERTENSION (11/04/2008)   CARDIOMYOPATHY, SECONDARY (11/04/2008)   Atrial fibrillation (09/21/2009)   CHF (11/04/2008)   AUTOMATIC IMPLANTABLE CARDIAC DEFIBRILLATOR SITU-[facility] (11/04/2008)   Syncope (03/31/2012)   The patient had syncope which in the absence of arrhythmia identified on his device is almost certainly vasomotor and this is consistent with his history of preceding orthostatic intolerance and his recurring falls. He has a history of hypertension which likely contributes to stiff vessels and impaired orthostatic intolerance. I have reviewed with the family multiple maneuvers to try to help offset orthostatic hypotension including isometrically contraction prior to standing, thigh high support stockings with 30-40 mm of pressure, the use of ProAmatine and Mestinon.  The issue of anticoagulation needs to be clarified; as noted above I suspect the falls are the reason. There are data suggesting that multiple falls every month they be necessary to offset high risk atrial fibrillation thromboembolic risk reduction with anticoagulation.  This is particularly true in a patient who is taking high-dose aspirin against which apixaban has been studied in randomized fashion and found to be no more risky than even an 81 mg of aspirin and still more effective than Coumadin.  In addition, I'm concerned about the 80% ventricular pacing during his atrial fibrillation. Some of this may be aberration; however, I wonder whether augmented rate control and/or catheter ablation of the AV node might not be helpful in maximizing resynchronization benefit.  The patient is asked to followup with his PCP and Dr. Ladona Ridgel.    Signed, Sherryl Manges MD  04/01/2012

## 2012-04-01 NOTE — Progress Notes (Signed)
Pt's 02 sat dropped between 86-87% on 2L upon ambulation and 88% on 3L. Pt was dyspneic on exertion, and slightly SOB resting. Will continue to monitor closely.

## 2012-04-01 NOTE — Progress Notes (Signed)
Utilization review completed.  

## 2012-04-02 DIAGNOSIS — I5022 Chronic systolic (congestive) heart failure: Secondary | ICD-10-CM | POA: Diagnosis present

## 2012-04-02 LAB — BASIC METABOLIC PANEL
Chloride: 99 mEq/L (ref 96–112)
GFR calc Af Amer: 75 mL/min — ABNORMAL LOW (ref >90)
GFR calc non Af Amer: 65 mL/min — ABNORMAL LOW (ref >90)
Potassium: 3.4 mEq/L — ABNORMAL LOW (ref 3.5–5.1)
Sodium: 137 mEq/L (ref 135–145)

## 2012-04-02 MED ORDER — POTASSIUM CHLORIDE CRYS ER 20 MEQ PO TBCR
40.0000 meq | EXTENDED_RELEASE_TABLET | Freq: Once | ORAL | Status: AC
  Administered 2012-04-02: 40 meq via ORAL
  Filled 2012-04-02: qty 2

## 2012-04-02 MED ORDER — POTASSIUM CHLORIDE CRYS ER 20 MEQ PO TBCR
20.0000 meq | EXTENDED_RELEASE_TABLET | Freq: Two times a day (BID) | ORAL | Status: DC
  Administered 2012-04-02 – 2012-04-03 (×2): 20 meq via ORAL
  Filled 2012-04-02 (×3): qty 1

## 2012-04-02 MED ORDER — FUROSEMIDE 80 MG PO TABS
80.0000 mg | ORAL_TABLET | Freq: Two times a day (BID) | ORAL | Status: DC
  Administered 2012-04-02 – 2012-04-03 (×3): 80 mg via ORAL
  Filled 2012-04-02 (×5): qty 1

## 2012-04-02 NOTE — Progress Notes (Signed)
SATURATION QUALIFICATIONS: (This note is used to comply with regulatory documentation for home oxygen)  Patient Saturations on Room Air at Rest = 87%  Patient Saturations on Room Air while Ambulating = 84%  Patient Saturations on 2-3L Liters of oxygen while Ambulating = 91-93%

## 2012-04-02 NOTE — Progress Notes (Addendum)
Patient ID: Arthur Armstrong, male   DOB: 1926-06-11, 78 y.o.   MRN: [id number]   SUBJECTIVE: The patient is feeling better but feels that he's not back to baseline. There is a complete note from the pulmonary team suggesting that he needs home O2 and pulmonary followup based on low O2 sats with walking. In addition he is fluid overloaded. He diuresis close to 5 L yesterday. I do not yet see chemistry labs from today.   Filed Vitals:   04/01/12 1836 04/01/12 2100 04/02/12 0238 04/02/12 0500  BP: 107/68 108/75 131/83 107/68  Pulse: 60 60  60  Temp:  97.5 F (36.4 C)  97.5 F (36.4 C)  TempSrc:  Oral  Oral  Resp:      Height:      Weight:    164 lb (74.39 kg)  SpO2:  95%  92%    Intake/Output Summary (Last 24 hours) at 04/02/12 0819 Last data filed at 04/02/12 0819  Gross per 24 hour  Intake    700 ml  Output   5450 ml  Net  -4750 ml    LABS: Basic Metabolic Panel:  Basename 04/01/12 0330 03/31/12 1532  NA 139 136  K 3.5 3.4*  CL 105 101  CO2 26 26  GLUCOSE 147* 175*  BUN 23 29*  CREATININE 1.02 1.25  CALCIUM 8.8 8.8  MG -- --  PHOS -- --   Liver Function Tests: No results found for this basename: AST:2,ALT:2,ALKPHOS:2,BILITOT:2,PROT:2,ALBUMIN:2 in the last 72 hours No results found for this basename: LIPASE:2,AMYLASE:2 in the last 72 hours CBC:  Basename 03/31/12 1532  WBC 4.5  NEUTROABS 3.2  HGB 12.2*  HCT 37.1*  MCV 93.2  PLT 94*   Cardiac Enzymes:  Basename 04/01/12 0330 03/31/12 2156 03/31/12 1535  CKTOTAL 46 57 --  CKMB 2.8 3.4 --  CKMBINDEX -- -- --  TROPONINI <0.30 <0.30 <0.30   BNP: No components found with this basename: POCBNP:3 D-Dimer: No results found for this basename: DDIMER:2 in the last 72 hours Hemoglobin A1C: No results found for this basename: HGBA1C in the last 72 hours Fasting Lipid Panel: No results found for this basename: CHOL,HDL,LDLCALC,TRIG,CHOLHDL,LDLDIRECT in the last 72 hours Thyroid Function Tests: No results found for  this basename: TSH,T4TOTAL,FREET3,T3FREE,THYROIDAB in the last 72 hours  RADIOLOGY: Dg Chest 1 View  03/31/2012  *RADIOLOGY REPORT*  Clinical Data: Dizziness.  Hypoxia, CHF.  CHEST - 1 VIEW  Comparison: 01/28/2011  Findings: Left AICD remains in place, unchanged.  Prior median sternotomy and valve replacement.  Cardiomegaly with vascular congestion.  No overt edema.  No confluent opacities or effusions. No acute bony abnormality.  Probable bibasilar scarring, stable.  IMPRESSION: Cardiomegaly with vascular congestion.   Original Report Authenticated By: Charlett Nose, M.D.     PHYSICAL EXAM The patient is oriented to person time and place. Affect is normal. One of his family members is in the room and I spoke with her also. He is lying comfortably in bed. There is no jugular venous distention. Lungs reveal diffuse crackles. I wonder if this is a combination of pulmonary disease and his fluid status. Cardiac exam reveals S1 and S2. There no clicks or significant murmurs. The abdomen is soft. There is trace peripheral edema.   TELEMETRY:  I have reviewed telemetry today April 02, 2012. The rhythm is paced.   ASSESSMENT AND PLAN:   Atrial fibrillation  The atrial fib rate is controlled. Today he is paced   Acute on chronic  systolic CHF   Fluid overload with acute on chronic systolic heart failure along with hypoxia that is both chronic and acute is the basis of his hospitalization. These factors led to presyncope. He diuresis almost 5 L yesterday. I will change his IV Lasix to oral today. However chemistry lab for today and tomorrow. Followup chest x-ray will be done. Hopefully his overall status we'll have stabilized by tomorrow. If so consideration can be given to letting him go home. Home O2 will have to be arranged before he can go. Also he should not be discharged unless we are sure that his CHF is completely stabilized   AUTOMATIC IMPLANTABLE CARDIAC DEFIBRILLATOR S[facility]U-[facility]   Syncope     It appears that the patient's presentation was with severe hypoxia and near syncope   Hypoxemia    Appreciate the complete note from the pulmonary team yesterday. The patient's O2 sat dropped significantly even while on oxygen. He qualifies for home O2. The recommendation from the pulmonary team was that he receive oxygen to wear all the time. It was also recommended that the patient have outpatient pulmonary followup after this hospitalization. I am trying to order his home O2, but I have not yet been able to complete this within [employer]. I will be asking the nursing staff for help.       Willa Rough 04/02/2012 8:19 AM

## 2012-04-03 ENCOUNTER — Encounter (HOSPITAL_COMMUNITY): Payer: Self-pay | Admitting: Nurse Practitioner

## 2012-04-03 ENCOUNTER — Telehealth: Payer: Self-pay | Admitting: Internal Medicine

## 2012-04-03 DIAGNOSIS — I428 Other cardiomyopathies: Secondary | ICD-10-CM

## 2012-04-03 LAB — BASIC METABOLIC PANEL
CO2: 33 mEq/L — ABNORMAL HIGH (ref 19–32)
Chloride: 100 mEq/L (ref 96–112)
Creatinine, Ser: 1.01 mg/dL (ref 0.50–1.35)
GFR calc Af Amer: 77 mL/min — ABNORMAL LOW (ref >90)
Sodium: 138 mEq/L (ref 135–145)

## 2012-04-03 MED ORDER — POTASSIUM CHLORIDE CRYS ER 20 MEQ PO TBCR: 20.0000 meq | EXTENDED_RELEASE_TABLET | Freq: Two times a day (BID) | ORAL | Status: DC

## 2012-04-03 MED ORDER — FUROSEMIDE 40 MG PO TABS: 80.0000 mg | ORAL_TABLET | Freq: Two times a day (BID) | ORAL | Status: DC

## 2012-04-03 NOTE — Telephone Encounter (Signed)
Per Thayer Ohm the PA call this is a transitional care pt

## 2012-04-03 NOTE — Progress Notes (Signed)
DC orders received.  Patient stable with no S/S of distress.  Medication and discharge information reviewed with patient and patient's family member.  Patient DC home with continuous hone O2. Arthur Armstrong

## 2012-04-03 NOTE — Discharge Summary (Signed)
Patient ID: Arthur Armstrong,  MRN: [id number], DOB/AGE: 1926-07-05 78 y.o.  Admit date: 03/31/2012 Discharge date: 04/03/2012  Primary Care Provider: Gaspar Garbe Primary Cardiologist: G. Ladona Ridgel, MD  Discharge Diagnoses Principal Problem:  *Syncope  *In setting of hypoxia/CHF and ? orthostasis. Active Problems:  Acute on chronic systolic CHF (congestive heart failure)  ** Net Negative I/O of 2.85 Liters this admission with weight reduction from 167 lbs on admission to 157 lbs on discharge.    Nonischemic cardiomyopathy  HYPERTENSION  Atrial fibrillation  **Not on coumadin 2/2 h/o falls.  Dyslipidemia  Hypoxemia  Restrictive lung disease  AUTOMATIC IMPLANTABLE CARDIAC DEFIBRILLATOR SITU-[employer]  S/P mitral valve repair  Thrombocytopenia  Allergies No Known Allergies  Procedures  Pulmonary Function Tests  Restrictive pattern consistent with pulmonary edema with lower DLCO.  History of Present Illness  78 y/o male with prior h/o NICM s/p ICD placement.  He was in his USOH until 03/31/2012, when after driving to a store and getting out of his car he felt dizziness causing him to lean upon his car to steady himself.  He believed that he lost consciousness however store employees found him leaning on his car and he was responsive.  EMS was called and he was found to be hypoxic with O2 sats in the mid-80's, which improved into the 90's with O2 administration.  He was taken to the [facility] ED where CXR showed cardiomegaly with vascular congestion and pBNP was elevated.  Pt was admitted for further evaluation and mgmt of possible syncope and acute on chronic systolic chf.  Hospital Course  Pt r/o for MI.  His ICD was interrogated and showed no evidence of arrhythmia or ICD shock, to account for presyncope/syncope.  He was evaluated by EP with recommendation for thigh high support stockings with 30-40 mm of pressure and isometric contraction prior to standing in order to avoid possible  orthostatic causes of dizziness.  It was also recommended that he f/u with Dr. Ladona Ridgel as an outpatient to consider augmented rate control through his device and/or catheter ablation of the AV node in an effort to maximize his resynchronization benefit, as interrogation showed 80% ventricular pacing with underlying atrial fibrillation.  As pt was noted to have evidence of volume overload, he was placed on IV lasix.  For this admission, he has had a net negative I/O of 2.85 Liters with weight reduction from 167 lbs on admission to 157 lbs this AM.  Despite diuresis, he did not initially show significant clinical improvement and PFT's were performed.  Pulmonology was consulted and reviewed PFT's, which they felt to be most consistent with pulmonary edema (restrictive with low DLCO).   Further, secondary to significant drops in oxygen saturation when off of supplemental O2 (87% @ rest, 84% with ambulation), we have arranged for home O2 therapy.  With further diuresis, pt has had significant improvement in respiratory status. He will be discharged home today in good condition with f/u arranged in 1 wk.  Discharge Vitals Blood pressure 94/62, pulse 65, temperature 98 F (36.7 C), temperature source Oral, resp. rate 16, height 5\' 5"  (1.651 m), weight 157 lb 1.6 oz (71.26 kg), SpO2 95.00%.  Filed Weights   04/01/12 0500 04/02/12 0500 04/03/12 0436  Weight: 167 lb (75.751 kg) 164 lb (74.39 kg) 157 lb 1.6 oz (71.26 kg)   Labs  CBC  Basename 03/31/12 1532  WBC 4.5  NEUTROABS 3.2  HGB 12.2*  HCT 37.1*  MCV 93.2  PLT 94*  Basic Metabolic Panel  Basename 04/03/12 0540 04/02/12 0850  NA 138 137  K 3.2* 3.4*  CL 100 99  CO2 33* 29  GLUCOSE 100* 152*  BUN 18 20  CREATININE 1.01 1.03  CALCIUM 9.1 9.0  MG -- --  PHOS -- --   Cardiac Enzymes  Basename 04/01/12 0330 03/31/12 2156 03/31/12 1535  CKTOTAL 46 57 --  CKMB 2.8 3.4 --  CKMBINDEX -- -- --  TROPONINI <0.30 <0.30 <0.30    Disposition  Pt is being discharged home today in good condition.  Follow-up Plans & Appointments      Follow-up Information    Follow up with Tereso Newcomer, PA. On 04/10/2012. (9:10 AM)    Contact information:   [address] [place] [address] [phone]       Follow up with Lewayne Bunting, MD. On 04/17/2012. (12:00 PM)    Contact information:   [address] [place] [address] [phone]       Follow up with Gaspar Garbe, MD. (as scheduled)    Contact information:   [address] [facility] [place] [address] [phone]       Follow up with Barbaraann Share, MD. On 04/24/2012. (9:45 AM)    Contact information:   [facility] [address] [place] [address] [phone]        Discharge Medications    Medication List     As of 04/03/2012 10:57 AM    TAKE these medications         aspirin EC 325 MG tablet   Take 325 mg by mouth daily.      carvedilol 6.25 MG tablet   Commonly known as: COREG   Take 1 tablet (6.25 mg total) by mouth 2 (two) times daily with a meal.      FLOMAX 0.4 MG Caps   Generic drug: Tamsulosin HCl   Take 0.4 mg by mouth at bedtime.      furosemide 40 MG tablet   Commonly known as: LASIX   Take 2 tablets (80 mg total) by mouth 2 (two) times daily.      niacin 500 MG CR tablet   Commonly known as: NIASPAN   Take 500 mg by mouth daily.      potassium chloride SA 20 MEQ tablet   Commonly known as: K-DUR,KLOR-CON   Take 1 tablet (20 mEq total) by mouth 2 (two) times daily.        Outstanding Labs/Studies  None  Duration of Discharge Encounter   Greater than 30 minutes including physician time.  Signed, Nicolasa Ducking NP 04/03/2012, 10:57 AM I made the decision for the patent  to go home today. Please refer also to my complete progress note from today  Jerral Bonito, MD

## 2012-04-03 NOTE — Progress Notes (Deleted)
Patient ID: Arthur Armstrong, male   DOB: April 14, 1927, 78 y.o.   MRN: [id number]   SUBJECTIVE:  There are  2   progress notes being written by the cardiology team this morning. Please disregard this one  and refer to the note of [name] and me.  Filed Vitals:   04/02/12 1410 04/02/12 2033 04/03/12 0436 04/03/12 0758  BP: 106/59 117/69 119/76 94/62  Pulse: 65 61 60 65  Temp: 98 F (36.7 C) 97.4 F (36.3 C) 98 F (36.7 C)   TempSrc:  Oral Oral   Resp: 18 18 16    Height:      Weight:   157 lb 1.6 oz (71.26 kg)   SpO2: 93% 95% 96% 95%    Intake/Output Summary (Last 24 hours) at 04/03/12 0908 Last data filed at 04/03/12 0800  Gross per 24 hour  Intake    960 ml  Output   2775 ml  Net  -1815 ml    LABS: Basic Metabolic Panel:  Basename 04/03/12 0540 04/02/12 0850  NA 138 137  K 3.2* 3.4*  CL 100 99  CO2 33* 29  GLUCOSE 100* 152*  BUN 18 20  CREATININE 1.01 1.03  CALCIUM 9.1 9.0  MG -- --  PHOS -- --   Liver Function Tests: No results found for this basename: AST:2,ALT:2,ALKPHOS:2,BILITOT:2,PROT:2,ALBUMIN:2 in the last 72 hours No results found for this basename: LIPASE:2,AMYLASE:2 in the last 72 hours CBC:  Basename 03/31/12 1532  WBC 4.5  NEUTROABS 3.2  HGB 12.2*  HCT 37.1*  MCV 93.2  PLT 94*   Cardiac Enzymes:  Basename 04/01/12 0330 03/31/12 2156 03/31/12 1535  CKTOTAL 46 57 --  CKMB 2.8 3.4 --  CKMBINDEX -- -- --  TROPONINI <0.30 <0.30 <0.30   BNP: No components found with this basename: POCBNP:3 D-Dimer: No results found for this basename: DDIMER:2 in the last 72 hours Hemoglobin A1C: No results found for this basename: HGBA1C in the last 72 hours Fasting Lipid Panel: No results found for this basename: CHOL,HDL,LDLCALC,TRIG,CHOLHDL,LDLDIRECT in the last 72 hours Thyroid Function Tests: No results found for this basename: TSH,T4TOTAL,FREET3,T3FREE,THYROIDAB in the last 72 hours  RADIOLOGY: Dg Chest 1 View  03/31/2012  *RADIOLOGY REPORT*   Clinical Data: Dizziness.  Hypoxia, CHF.  CHEST - 1 VIEW  Comparison: 01/28/2011  Findings: Left AICD remains in place, unchanged.  Prior median sternotomy and valve replacement.  Cardiomegaly with vascular congestion.  No overt edema.  No confluent opacities or effusions. No acute bony abnormality.  Probable bibasilar scarring, stable.  IMPRESSION: Cardiomegaly with vascular congestion.   Original Report Authenticated By: Charlett Nose, M.D.     PHYSICAL EXAM    TELEMETRY:    ASSESSMENT AND PLAN:  This is a duplicate cardiology note. Please refer to the note written by [name] and me.  Arthur Armstrong 04/03/2012 9:08 AM

## 2012-04-03 NOTE — Telephone Encounter (Signed)
Patient just discharged this morning at 10:57.

## 2012-04-03 NOTE — Telephone Encounter (Signed)
Spoke with pt dtr, pt is doing fine. They are waiting for the 02 to be set up. Otherwise the pt is fine and aware of follow up appt.

## 2012-04-03 NOTE — Progress Notes (Signed)
Patient Name: Arthur Armstrong Date of Encounter: 04/03/2012     SUBJECTIVE  The patient is definitely feeling better today. I feel he has been diuresis and off for this hospitalization. I had a very careful discussion with him about his salt and fluid intake. He is ready to go home today.  CURRENT MEDS    . aspirin EC  325 mg Oral Daily  . carvedilol  6.25 mg Oral BID WC  . furosemide  80 mg Oral BID  . niacin  500 mg Oral Daily  . potassium chloride  20 mEq Oral BID  . [COMPLETED] potassium chloride  40 mEq Oral Once  . sodium chloride  3 mL Intravenous Q12H  . Tamsulosin HCl  0.4 mg Oral QHS   OBJECTIVE  Filed Vitals:   04/02/12 0500 04/02/12 1410 04/02/12 2033 04/03/12 0436  BP: 107/68 106/59 117/69 119/76  Pulse: 60 65 61 60  Temp: 97.5 F (36.4 C) 98 F (36.7 C) 97.4 F (36.3 C) 98 F (36.7 C)  TempSrc: Oral  Oral Oral  Resp:  18 18 16   Height:      Weight: 164 lb (74.39 kg)   157 lb 1.6 oz (71.26 kg)  SpO2: 92% 93% 95% 96%    Intake/Output Summary (Last 24 hours) at 04/03/12 0637 Last data filed at 04/03/12 0439  Gross per 24 hour  Intake    840 ml  Output   2850 ml  Net  -2010 ml   Filed Weights   04/01/12 0500 04/02/12 0500 04/03/12 0436  Weight: 167 lb (75.751 kg) 164 lb (74.39 kg) 157 lb 1.6 oz (71.26 kg)   PHYSICAL EXAM Patient is oriented to person time and place. Affect is normal. There may be a few scattered rales. There is no respiratory distress. He is wearing oxygen. Cardiac exam reveals S1 and S2. Abdomen is soft. There is no significant peripheral edema.  Accessory Clinical Findings  CBC  Basename 03/31/12 1532  WBC 4.5  NEUTROABS 3.2  HGB 12.2*  HCT 37.1*  MCV 93.2  PLT 94*   Basic Metabolic Panel  Basename 04/02/12 0850 04/01/12 0330  NA 137 139  K 3.4* 3.5  CL 99 105  CO2 29 26  GLUCOSE 152* 147*  BUN 20 23  CREATININE 1.03 1.02  CALCIUM 9.0 8.8  MG -- --  PHOS -- --   Cardiac Enzymes  Basename 04/01/12 0330  03/31/12 2156 03/31/12 1535  CKTOTAL 46 57 --  CKMB 2.8 3.4 --  CKMBINDEX -- -- --  TROPONINI <0.30 <0.30 <0.30   TELE I have reviewed telemetry today April 03, 2012. His rhythm is paced.  Radiology/Studies  Dg Chest 1 View  03/31/2012  *RADIOLOGY REPORT*  Clinical Data: Dizziness.  Hypoxia, CHF.  CHEST - 1 VIEW  Comparison: 01/28/2011  Findings: Left AICD remains in place, unchanged.  Prior median sternotomy and valve replacement.  Cardiomegaly with vascular congestion.  No overt edema.  No confluent opacities or effusions. No acute bony abnormality.  Probable bibasilar scarring, stable.  IMPRESSION: Cardiomegaly with vascular congestion.   Original Report Authenticated By: Charlett Nose, M.D.    ASSESSMENT AND PLAN  1.  Syncope:    His original episode probably was related to a combination of CHF and hypoxia. He became dizzy and weak and leaned over his car and remained in this position until he was helped inside. He never actually passed out in the fashion leading to falling to the floor. Hopefully this situation  will now be stabilized with stabilization of his volume status and continuous oxygen.  2.  Acute on chronic systolic CHF:    This is now improved. He is diaries well and ongoing oxygen will help him.  3.  Hypoxemia:  In setting of #2.  Appreciate pulm recs.  Will arrange outpt pulm f/u.  Signed, Nicolasa Ducking NP Patient seen and examined. I agree with the assessment and plan as detailed above. See also my additional thoughts below.   The patient has had excellent further diuresis on oral Lasix yesterday. He still has a few crackles on exam. However he is greatly improved. He is stable to go home. I have had a careful discussion with him concerning limiting salt intake and limiting overall fluid intake including water. We will arrange for early followup in the office post hospital. He will go home on Lasix 80 twice a day. He also will go home with home oxygen.  Willa Rough, MD, [facility] 04/03/2012 9:32 AM

## 2012-04-06 ENCOUNTER — Encounter (HOSPITAL_COMMUNITY): Payer: BC Managed Care – PPO

## 2012-04-07 ENCOUNTER — Encounter (HOSPITAL_COMMUNITY): Payer: BC Managed Care – PPO

## 2012-04-09 ENCOUNTER — Encounter (HOSPITAL_COMMUNITY): Payer: BC Managed Care – PPO

## 2012-04-10 ENCOUNTER — Encounter: Payer: Self-pay | Admitting: Physician Assistant

## 2012-04-10 ENCOUNTER — Encounter: Payer: Self-pay | Admitting: *Deleted

## 2012-04-10 ENCOUNTER — Ambulatory Visit (INDEPENDENT_AMBULATORY_CARE_PROVIDER_SITE_OTHER): Payer: No Typology Code available for payment source | Admitting: Physician Assistant

## 2012-04-10 VITALS — BP 110/58 | HR 65 | Ht 65.0 in | Wt 158.4 lb

## 2012-04-10 DIAGNOSIS — R0902 Hypoxemia: Secondary | ICD-10-CM

## 2012-04-10 DIAGNOSIS — I10 Essential (primary) hypertension: Secondary | ICD-10-CM

## 2012-04-10 DIAGNOSIS — I5022 Chronic systolic (congestive) heart failure: Secondary | ICD-10-CM

## 2012-04-10 DIAGNOSIS — I4891 Unspecified atrial fibrillation: Secondary | ICD-10-CM

## 2012-04-10 LAB — BASIC METABOLIC PANEL
CO2: 31 mEq/L (ref 19–32)
Calcium: 9.2 mg/dL (ref 8.4–10.5)
GFR: 65.1 mL/min (ref >60.00)
Glucose, Bld: 154 mg/dL — ABNORMAL HIGH (ref 70–99)
Potassium: 4.1 mEq/L (ref 3.5–5.1)
Sodium: 138 mEq/L (ref 135–145)

## 2012-04-10 NOTE — Progress Notes (Signed)
[address] [place], [place]  [address] Phone: [phone], Fax:  [phone]  Date:  04/10/2012   Name:  Arthur Armstrong   DOB:  [birth year]   MRN:  [id number]  PCP:  Gaspar Garbe, MD  Primary Cardiologist:  Dr. Lewayne Bunting  Primary Electrophysiologist:  Dr. Lewayne Bunting    History of Present Illness: TANAV ORSAK is a 78 y.o. male who returns for follow up after recent admission to the hospital for questionable syncope in the setting of hypoxic respiratory failure due to a/c systolic CHF.    He has a h/o NICM EF 35%, s/p BiV/AICD, chronic systolic CHF, s/p MV repair and PFO closure in 7/06, nonobs. CAD by cath in 7/06 (pLM 20%, pLAD 40%; mCFX 20%), HTN and AFib.  Echo 2/12: EF of 30-35%; moderate LVH; inferior hypokinesis; mild to moderate LAE and mild RAE; PASP 35.    He was admitted 11/26-11/29. He presented with hypoxic respiratory failure and questionable syncope versus near-syncope. ICD demonstrated no arrhythmias. He was seen by EP (Dr. Graciela Husbands) who recommended thigh high support stockings. Follow up with Dr. Ladona Ridgel was recommended for possible augmented rate controll through his device or catheter ablation of his AV node in an effort to maximize resynchronization benefit, as interrogation demonstrated 80% V. pacing with underlying atrial fibrillation. He was also seen in pulmonology for hypoxia. PFTs were felt to be consistent with pulmonary edema (restricted with low DLCO). Patient was placed on supplemental oxygen due to hypoxia. He was diuresed and his weight decreased to 157 pounds with clinical improvement.  Since d/c he is doing well.  Denies significant dyspnea now.  He describes Class II-IIb symptoms.  No orthopnea, PND, edema.  No syncope.  No chest pain.    Labs (11/13):    K 3.2, creatinine 1.01, BNP 9711, Hgb 12.2  Wt Readings from Last 3 Encounters:  04/10/12 158 lb 6.4 oz (71.85 kg)  04/03/12 157 lb 1.6 oz (71.26 kg)  02/04/12 174 lb  (78.926 kg)     Past Medical History  Diagnosis Date  . Automatic implantable cardiac defibrillator in situ     BiV pacer  . NICM (nonischemic cardiomyopathy)     nicm ef 30-35%,nonob cad-cath 2006,echo 2/12:ef30-35%.mod lvh,inf hk,mild-mod lae and mild rae,pasp 35  . Systolic CHF   . Unspecified essential hypertension   . Atrial fibrillation   . S/P mitral valve repair     2006  . HTN (hypertension)   . HLD (hyperlipidemia)   . Coronary artery disease, non-occlusive     cath 2006 pLM 20%, pLAD 40%; mCFX 20%  . PFO (patent foramen ovale)     s/p repair 2006  . Chronic systolic CHF (congestive heart failure)   . ICD (implantable cardiac defibrillator) in place     BIV ICD  . Pacemaker     BIV ICD  . Shortness of breath     Current Outpatient Prescriptions  Medication Sig Dispense Refill  . aspirin EC 325 MG EC tablet Take 325 mg by mouth daily.        . carvedilol (COREG) 6.25 MG tablet Take 1 tablet (6.25 mg total) by mouth 2 (two) times daily with a meal.  180 tablet  5  . furosemide (LASIX) 40 MG tablet Take 2 tablets (80 mg total) by mouth 2 (two) times daily.  120 tablet  6  . niacin (NIASPAN) 500 MG CR tablet Take 500 mg by mouth daily.        Marland Kitchen  potassium chloride (K-DUR,KLOR-CON) 20 MEQ tablet Take 1 tablet (20 mEq total) by mouth 2 (two) times daily.  60 tablet  6  . Tamsulosin HCl (FLOMAX) 0.4 MG CAPS Take 0.4 mg by mouth at bedtime.          Allergies:   No Known Allergies  Social History:  The patient  reports that he quit smoking about 50 years ago. He has never used smokeless tobacco. He reports that he does not drink alcohol or use illicit drugs.   ROS:  Please see the history of present illness.    All other systems reviewed and negative.   PHYSICAL EXAM: VS:  BP 110/58  Pulse 65  Ht 5\' 5"  (1.651 m)  Wt 158 lb 6.4 oz (71.85 kg)  BMI 26.36 kg/m2 Well nourished, well developed, in no acute distress HEENT: normal Neck: minimally elevated JVD Cardiac:   normal S1, S2; RRR; 2/6 systolic murmur at LLSB Lungs:  Bibasilar crackles (? Chronic), no wheezing or rhonchi  Abd: soft, nontender, no hepatomegaly Ext: trace bilateral LE edema Skin: warm and dry Neuro:  CNs 2-12 intact, no focal abnormalities noted  EKG:  V paced, HR 65      ASSESSMENT AND PLAN:  1. Chronic Systolic CHF:   Volume appears to be stable. He feels much better. Continue current therapy. Check a basic metabolic panel today.  2. Atrial Fibrillation:   He is not a Coumadin candidate secondary to history of falls. He has followup next week with Dr. Ladona Ridgel. As noted, Dr. Graciela Husbands suggested augmented rate control with his device or possibly ablation to help control his rate with atrial fibrillation.  3. Hypoxia:   Followup with pulmonary as scheduled.  4. Hypertension:   Controlled.  Continue current therapy.   5. Disposition:   Followup with Dr. Ladona Ridgel as planned.  Luna Glasgow, PA-C  9:44 AM 04/10/2012

## 2012-04-10 NOTE — Patient Instructions (Addendum)
BMET TODAY  KEEP APPT WITH DR. Ladona Ridgel 04/17/12 @ 12:00  NO CHANGES WERE MADE TODAY

## 2012-04-13 ENCOUNTER — Encounter (HOSPITAL_COMMUNITY): Payer: BC Managed Care – PPO

## 2012-04-13 ENCOUNTER — Other Ambulatory Visit: Payer: Self-pay | Admitting: *Deleted

## 2012-04-13 DIAGNOSIS — I5022 Chronic systolic (congestive) heart failure: Secondary | ICD-10-CM

## 2012-04-13 MED ORDER — FUROSEMIDE 40 MG PO TABS: ORAL_TABLET | ORAL | Status: DC

## 2012-04-13 MED ORDER — POTASSIUM CHLORIDE CRYS ER 20 MEQ PO TBCR: EXTENDED_RELEASE_TABLET | ORAL | Status: DC

## 2012-04-14 ENCOUNTER — Encounter (HOSPITAL_COMMUNITY): Payer: BC Managed Care – PPO

## 2012-04-16 ENCOUNTER — Encounter (HOSPITAL_COMMUNITY): Payer: BC Managed Care – PPO

## 2012-04-17 ENCOUNTER — Other Ambulatory Visit (INDEPENDENT_AMBULATORY_CARE_PROVIDER_SITE_OTHER): Payer: No Typology Code available for payment source

## 2012-04-17 ENCOUNTER — Encounter: Payer: Self-pay | Admitting: Internal Medicine

## 2012-04-17 ENCOUNTER — Ambulatory Visit (INDEPENDENT_AMBULATORY_CARE_PROVIDER_SITE_OTHER): Payer: No Typology Code available for payment source | Admitting: Internal Medicine

## 2012-04-17 VITALS — BP 109/66 | HR 58 | Ht 65.0 in | Wt 158.8 lb

## 2012-04-17 DIAGNOSIS — I4891 Unspecified atrial fibrillation: Secondary | ICD-10-CM

## 2012-04-17 DIAGNOSIS — I5023 Acute on chronic systolic (congestive) heart failure: Secondary | ICD-10-CM

## 2012-04-17 DIAGNOSIS — I428 Other cardiomyopathies: Secondary | ICD-10-CM

## 2012-04-17 DIAGNOSIS — Z95810 Presence of automatic (implantable) cardiac defibrillator: Secondary | ICD-10-CM

## 2012-04-17 DIAGNOSIS — I509 Heart failure, unspecified: Secondary | ICD-10-CM

## 2012-04-17 DIAGNOSIS — I5022 Chronic systolic (congestive) heart failure: Secondary | ICD-10-CM

## 2012-04-17 DIAGNOSIS — R0989 Other specified symptoms and signs involving the circulatory and respiratory systems: Secondary | ICD-10-CM

## 2012-04-17 MED ORDER — APIXABAN 5 MG PO TABS: 5.0000 mg | ORAL_TABLET | Freq: Two times a day (BID) | ORAL | Status: DC

## 2012-04-17 NOTE — Assessment & Plan Note (Signed)
His ventricular rate is well controlled. His risk for stroke is high. He has had falls in the past. Despite this, his bleeding risk on aspirin is probably the similar to his bleeding risk on Eliquis. I've asked the patient to stop her aspirin today and start Eliquis 5 mg twice daily. We'll see the patient back in several months.

## 2012-04-17 NOTE — Assessment & Plan Note (Signed)
His biventricular ICD appears to be working normally. We'll recheck in several months.

## 2012-04-17 NOTE — Assessment & Plan Note (Signed)
His chronic systolic heart failure is class 2-3. He will continue his current medical therapy. Would at least consider cardioversion if he is able to maintain anticoagulation.

## 2012-04-17 NOTE — Patient Instructions (Signed)
Your physician recommends that you schedule a follow-up appointment in: Mid January with Dr Ladona Ridgel  Your physician has recommended you make the following change in your medication: STOP Aspirin and START Eloquis 5 mg daily

## 2012-04-17 NOTE — Progress Notes (Signed)
HPI Arthur Armstrong returns today for followup. He is an 78 year old man who was admitted to the hospital with a syncopal episode several weeks ago. He did not have an ICD shock. He does have chronic atrial fibrillation. In the interim, his heart failure symptoms have been class 2-3. He is chronically been in atrial fibrillation for the last year. He has not been on anticoagulation because of concerns about falling. No Known Allergies   Current Outpatient Prescriptions  Medication Sig Dispense Refill  . carvedilol (COREG) 6.25 MG tablet Take 1 tablet (6.25 mg total) by mouth 2 (two) times daily with a meal.  180 tablet  5  . furosemide (LASIX) 40 MG tablet Take 2 tablets by mouth every AM and 1 tablet in the afternoon around 2pm      . niacin (NIASPAN) 500 MG CR tablet Take 500 mg by mouth daily.        . potassium chloride SA (K-DUR,KLOR-CON) 20 MEQ tablet Take one tablet by mouth every AM and 1/2 tablet every PM      . Tamsulosin HCl (FLOMAX) 0.4 MG CAPS Take 0.4 mg by mouth at bedtime.        Marland Kitchen apixaban (ELIQUIS) 5 MG TABS tablet Take 5 mg by mouth 2 (two) times daily. Every 12 hrs         Past Medical History  Diagnosis Date  . Automatic implantable cardiac defibrillator in situ     BiV pacer  . NICM (nonischemic cardiomyopathy)     nicm ef 30-35%,nonob cad-cath 2006,echo 2/12:ef30-35%.mod lvh,inf hk,mild-mod lae and mild rae,pasp 35  . Systolic CHF   . Unspecified essential hypertension   . Atrial fibrillation   . S/P mitral valve repair     2006  . HTN (hypertension)   . HLD (hyperlipidemia)   . Coronary artery disease, non-occlusive     cath 2006 pLM 20%, pLAD 40%; mCFX 20%  . PFO (patent foramen ovale)     s/p repair 2006  . Chronic systolic CHF (congestive heart failure)   . ICD (implantable cardiac defibrillator) in place     BIV ICD  . Pacemaker     BIV ICD  . Shortness of breath     ROS:   All systems reviewed and negative except as noted in the HPI.   Past  Surgical History  Procedure Date  . Rt. shoulder   . Bilateral knee arthroscopy   . Colonoscopy with polypectomy   . Intraoperative transesophageal echocardiogram   . Median sterotomy for mitral valve repair   . Implantation of a dual chamber biventricular icd   . Hip pinning 3/2/012     Family History  Problem Relation Age of Onset  . Heart disease Neg Hx     Rheumatic or valvular heart disease   . Cardiomyopathy Neg Hx      History   Social History  . Marital Status: Married    Spouse Name: N/A    Number of Children: 6  . Years of Education: N/A   Occupational History  . 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 [employer] in his church    Social History Main Topics  . Smoking status: Former Smoker    Quit date: 05/06/1961  . Smokeless tobacco: Never Used  . Alcohol Use: No  . Drug Use: No  . Sexually Active: Not Currently   Other Topics Concern  . Not on file   Social History Narrative   Lives  with his wife in [place].Has 6 grown children.Remains quite active.     BP 109/66  Pulse 58  Ht 5\' 5"  (1.651 m)  Wt 158 lb 12.8 oz (72.031 kg)  BMI 26.43 kg/m2  SpO2 92%  Physical Exam:  Well appearing elderly man, NAD HEENT: Unremarkable Neck:  8 cm JVD, no thyromegally Lungs:  Clear with no wheezes, rales, or rhonchi. HEART:  Regular rate rhythm, no murmurs, no rubs, no clicks Abd:  soft, positive bowel sounds, no organomegally, no rebound, no guarding Ext:  2 plus pulses, no edema, no cyanosis, no clubbing Skin:  No rashes no nodules Neuro:  CN II through XII intact, motor grossly intact  DEVICE  Normal device function.  See PaceArt for details. Underlying rhythm is atrial fibrillation Assess/Plan:

## 2012-04-20 ENCOUNTER — Encounter (HOSPITAL_COMMUNITY): Payer: BC Managed Care – PPO

## 2012-04-21 ENCOUNTER — Encounter (HOSPITAL_COMMUNITY): Payer: BC Managed Care – PPO

## 2012-04-23 ENCOUNTER — Encounter (HOSPITAL_COMMUNITY): Payer: BC Managed Care – PPO

## 2012-04-24 ENCOUNTER — Encounter: Payer: Self-pay | Admitting: Pulmonary Disease

## 2012-04-24 ENCOUNTER — Ambulatory Visit (INDEPENDENT_AMBULATORY_CARE_PROVIDER_SITE_OTHER): Payer: No Typology Code available for payment source | Admitting: Pulmonary Disease

## 2012-04-24 VITALS — BP 102/66 | HR 60 | Temp 97.4°F | Ht 65.0 in | Wt 161.0 lb

## 2012-04-24 DIAGNOSIS — R0902 Hypoxemia: Secondary | ICD-10-CM

## 2012-04-24 NOTE — Assessment & Plan Note (Addendum)
CT chest 2012:  No significant ISLD, minimal basilar scarring PFT's 10/2011:  No obstruction, NO restriction, and moderate decrease in DLCO   The patient is post hospitalization for congestive heart failure, and the question was raised whether he may have underlying lung disease.  His PFTs in June of this year when he was at his normal baseline showed no obstruction, no restriction, and a moderate decrease in DLCO that I suspect is related to his underlying cardiac disease.  He feels that his breathing is much improved since the hospitalization, but he still needs oxygen with exertion and sleep.  I cannot r/o with 100% certainty a contribution from an interstitial process, but unfortunately there is very little we can do for this given his age and underlying heart disease.  I would be happy to see him again if new issues develop.

## 2012-04-24 NOTE — Patient Instructions (Addendum)
You will need to stay on oxygen with heavier exertional activities, but do not require at rest.  You will also need to stay on oxygen at night while sleeping. followup with me as needed.

## 2012-04-24 NOTE — Progress Notes (Signed)
  Subjective:    Patient ID: Arthur Armstrong, male    DOB: 09/24/1926, 78 y.o.   MRN: [id number]  HPI Patient comes in today for a pulmonary polyp after a recent hospitalization with acute respiratory failure secondary to congestive heart failure.  The patient apparently had PFTs during his hospitalization that showed some restriction and decrease in DLCO, but he was in the middle of a CHF flare which can clearly give a restrictive picture on PFTs.  The patient had full PFTs in June of this year that showed no obstruction, no restriction, and a moderate decrease in diffusion capacity which is likely due to the known cardiomyopathy.  The patient has also had a CT scan last year that showed minimal scarring/interstitial process.  The patient feels that he is much improved since his hospitalization, and is nearing his usual baseline.  He is wearing oxygen 24 hours a day from hospitalization, and the question is been raised whether he still needs to be on this.   Review of Systems  Constitutional: Negative for fever and unexpected weight change.  HENT: Negative for ear pain, nosebleeds, congestion, sore throat, sneezing, trouble swallowing, dental problem, postnasal drip and sinus pressure.   Eyes: Negative for redness and itching.  Respiratory: Negative for cough, chest tightness, shortness of breath and wheezing.   Cardiovascular: Positive for leg swelling. Negative for palpitations.  Gastrointestinal: Negative for nausea and vomiting.  Genitourinary: Negative for dysuria.  Skin: Negative for rash.  Neurological: Negative for headaches.  Hematological: Does not bruise/bleed easily.  Psychiatric/Behavioral: Negative for dysphoric mood. The patient is not nervous/anxious.        Objective:   Physical Exam Well-developed male in no acute distress Nose without purulent discharge noted Oropharynx clear Neck without lymphadenopathy or thyromegaly Chest with basilar crackles, no wheezes or  rhonchi Cardiac exam with slightly irregular rhythm, 26 systolic murmur Lower extremities without significant edema, no cyanosis Alert and oriented, moves all 4 extremities.       Assessment & Plan:

## 2012-04-27 ENCOUNTER — Other Ambulatory Visit: Payer: No Typology Code available for payment source

## 2012-04-27 ENCOUNTER — Encounter (HOSPITAL_COMMUNITY): Payer: BC Managed Care – PPO

## 2012-04-28 ENCOUNTER — Encounter (HOSPITAL_COMMUNITY): Payer: BC Managed Care – PPO

## 2012-04-30 ENCOUNTER — Encounter (HOSPITAL_COMMUNITY): Payer: BC Managed Care – PPO

## 2012-05-04 ENCOUNTER — Encounter (HOSPITAL_COMMUNITY): Payer: BC Managed Care – PPO

## 2012-05-05 ENCOUNTER — Encounter (HOSPITAL_COMMUNITY): Payer: BC Managed Care – PPO

## 2012-05-05 ENCOUNTER — Telehealth: Payer: Self-pay | Admitting: *Deleted

## 2012-05-05 DIAGNOSIS — I10 Essential (primary) hypertension: Secondary | ICD-10-CM

## 2012-05-05 NOTE — Telephone Encounter (Signed)
pt states he was here the other day but no blood work was done. I stated I did not see where pt was in the office. pt states he will come in this week though if we need him too. I scheduled pt for 05/07/12 for bmet, pt said ok

## 2012-05-07 ENCOUNTER — Telehealth: Payer: Self-pay | Admitting: Pulmonary Disease

## 2012-05-07 ENCOUNTER — Other Ambulatory Visit (INDEPENDENT_AMBULATORY_CARE_PROVIDER_SITE_OTHER): Payer: No Typology Code available for payment source

## 2012-05-07 ENCOUNTER — Encounter (HOSPITAL_COMMUNITY): Payer: BC Managed Care – PPO

## 2012-05-07 DIAGNOSIS — I10 Essential (primary) hypertension: Secondary | ICD-10-CM

## 2012-05-07 DIAGNOSIS — R0902 Hypoxemia: Secondary | ICD-10-CM

## 2012-05-07 LAB — BASIC METABOLIC PANEL
BUN: 23 mg/dL (ref 6–23)
CO2: 29 mEq/L (ref 19–32)
Glucose, Bld: 204 mg/dL — ABNORMAL HIGH (ref 70–99)
Potassium: 4.1 mEq/L (ref 3.5–5.1)
Sodium: 136 mEq/L (ref 135–145)

## 2012-05-07 NOTE — Telephone Encounter (Signed)
Called, spoke with pt's daughter, Velna Hatchet.  States pt currently has a shoulder o2 tank.  States this is too heavy for him.  He has a pacemaker/defibulator on one side, shoulder problems on the other side, and uses a cane.  States he was able to go visit his wife everyday in the nursing home prior to being put on the o2.  He cannot do this now because he cannot carry the tank and has to have someone go with him to do this.  Velna Hatchet has heard the liquid o2 is easier to carry and lighter.  She would like to know if we can place an order for this.  Dr. Shelle Iron, are you ok with this?  Pls advise.  Thank you.  Pt's DME is [facility] per Velna Hatchet.

## 2012-05-07 NOTE — Telephone Encounter (Signed)
Arthur Armstrong is aware we will send a new order to [facility] for the liquid portable system.

## 2012-05-07 NOTE — Telephone Encounter (Signed)
Ok with me. Thank you. 

## 2012-05-11 ENCOUNTER — Telehealth: Payer: Self-pay | Admitting: *Deleted

## 2012-05-11 ENCOUNTER — Encounter (HOSPITAL_COMMUNITY): Payer: BC Managed Care – PPO

## 2012-05-11 NOTE — Telephone Encounter (Signed)
Message copied by Tarri Fuller on Mon May 11, 2012  3:16 PM ------      Message from: Waymon Budge      Created: Mon May 11, 2012  3:04 PM       Will forward to Danielle Rankin to follow up on.

## 2012-05-11 NOTE — Telephone Encounter (Signed)
pt notified about lab results w/verbal understanding today 

## 2012-05-12 ENCOUNTER — Encounter (HOSPITAL_COMMUNITY): Payer: BC Managed Care – PPO

## 2012-05-14 ENCOUNTER — Encounter (HOSPITAL_COMMUNITY): Payer: BC Managed Care – PPO

## 2012-05-14 ENCOUNTER — Other Ambulatory Visit: Payer: Self-pay | Admitting: Cardiology

## 2012-05-14 MED ORDER — NIACIN ER (ANTIHYPERLIPIDEMIC) 500 MG PO TBCR: 500.0000 mg | EXTENDED_RELEASE_TABLET | Freq: Every day | ORAL | Status: DC

## 2012-05-18 ENCOUNTER — Encounter (HOSPITAL_COMMUNITY): Payer: BC Managed Care – PPO

## 2012-05-19 ENCOUNTER — Encounter (HOSPITAL_COMMUNITY): Payer: BC Managed Care – PPO

## 2012-05-21 ENCOUNTER — Encounter (HOSPITAL_COMMUNITY): Payer: BC Managed Care – PPO

## 2012-05-21 ENCOUNTER — Encounter: Payer: Self-pay | Admitting: Internal Medicine

## 2012-05-21 ENCOUNTER — Ambulatory Visit (INDEPENDENT_AMBULATORY_CARE_PROVIDER_SITE_OTHER): Payer: Medicare PPO | Admitting: Internal Medicine

## 2012-05-21 VITALS — BP 114/70 | HR 60 | Ht 66.0 in | Wt 165.2 lb

## 2012-05-21 DIAGNOSIS — I4891 Unspecified atrial fibrillation: Secondary | ICD-10-CM

## 2012-05-21 DIAGNOSIS — Z95810 Presence of automatic (implantable) cardiac defibrillator: Secondary | ICD-10-CM

## 2012-05-21 DIAGNOSIS — I5023 Acute on chronic systolic (congestive) heart failure: Secondary | ICD-10-CM

## 2012-05-21 DIAGNOSIS — I509 Heart failure, unspecified: Secondary | ICD-10-CM

## 2012-05-21 NOTE — Assessment & Plan Note (Signed)
His ICD is working normally. We'll plan to recheck in several months. 

## 2012-05-21 NOTE — Progress Notes (Signed)
HPI Arthur Armstrong returns today for followup. He is a very pleasant 79 year old man with persistent atrial fibrillation, chronic systolic heart failure, status post ICD implantation. When I saw the patient last, he was started on anticoagulation as his thromboembolic risk was felt to be high and the benefits of anticoagulation were thought to outweigh the risk. So far he has tolerated his blood thinners very well. The patient has class IIb- IIIa heart failure symptoms. He denies chest pain. No Known Allergies   Current Outpatient Prescriptions  Medication Sig Dispense Refill  . apixaban (ELIQUIS) 5 MG TABS tablet Take 5 mg by mouth 2 (two) times daily. Every 12 hrs      . carvedilol (COREG) 6.25 MG tablet Take 1 tablet (6.25 mg total) by mouth 2 (two) times daily with a meal.  180 tablet  5  . furosemide (LASIX) 40 MG tablet Take 2 tablets by mouth every AM and 1 tablet in the afternoon around 2pm      . niacin (NIASPAN) 500 MG CR tablet Take 1 tablet (500 mg total) by mouth daily.  30 tablet  5  . potassium chloride SA (K-DUR,KLOR-CON) 20 MEQ tablet Take one tablet by mouth every AM and 1/2 tablet every PM      . Tamsulosin HCl (FLOMAX) 0.4 MG CAPS Take 0.4 mg by mouth at bedtime.           Past Medical History  Diagnosis Date  . Automatic implantable cardiac defibrillator in situ     BiV pacer  . NICM (nonischemic cardiomyopathy)     nicm ef 30-35%,nonob cad-cath 2006,echo 2/12:ef30-35%.mod lvh,inf hk,mild-mod lae and mild rae,pasp 35  . Systolic CHF   . Unspecified essential hypertension   . Atrial fibrillation   . S/P mitral valve repair     2006  . HTN (hypertension)   . HLD (hyperlipidemia)   . Coronary artery disease, non-occlusive     cath 2006 pLM 20%, pLAD 40%; mCFX 20%  . PFO (patent foramen ovale)     s/p repair 2006  . Chronic systolic CHF (congestive heart failure)   . ICD (implantable cardiac defibrillator) in place     BIV ICD  . Pacemaker     BIV ICD  . Shortness of  breath     ROS:   All systems reviewed and negative except as noted in the HPI.   Past Surgical History  Procedure Date  . Rt. shoulder   . Bilateral knee arthroscopy   . Colonoscopy with polypectomy   . Intraoperative transesophageal echocardiogram   . Median sterotomy for mitral valve repair   . Implantation of a dual chamber biventricular icd   . Hip pinning 3/2/012     Family History  Problem Relation Age of Onset  . Heart disease Neg Hx     Rheumatic or valvular heart disease   . Cardiomyopathy Neg Hx      History   Social History  . Marital Status: Married    Spouse Name: N/A    Number of Children: 6  . Years of Education: N/A   Occupational History  . 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 [employer] in his church    Social History Main Topics  . Smoking status: Former Smoker -- 1.0 packs/day for 5 years    Quit date: 05/06/1961  . Smokeless tobacco: Former Neurosurgeon    Quit date: 05/07/1951  . Alcohol Use: No  . Drug Use: No  .  Sexually Active: Not Currently   Other Topics Concern  . Not on file   Social History Narrative   Lives with his wife in [place].Has 6 grown children.Remains quite active.     BP 114/70  Pulse 60  Ht 5\' 6"  (1.676 m)  Wt 165 lb 3.2 oz (74.934 kg)  BMI 26.66 kg/m2  SpO2 92%  Physical Exam:  Well appearing 79 year old man, NAD HEENT: Unremarkable Neck:  No JVD, no thyromegally Lungs:  Clear with no wheezes, rales, or rhonchi. HEART:  IRegular rate rhythm, no murmurs, no rubs, no clicks Abd:  soft, positive bowel sounds, no organomegally, no rebound, no guarding Ext:  2 plus pulses, no edema, no cyanosis, no clubbing Skin:  No rashes no nodules Neuro:  CN II through XII intact, motor grossly intact   DEVICE  Normal device function.  See PaceArt for details. Underlying rhythm is atrial fibrillation  Assess/Plan:

## 2012-05-21 NOTE — Assessment & Plan Note (Signed)
While like to get the patient back in a sinus rhythm, he is been in atrial fibrillation for a long time, and has underlying lung disease. For this reason I think amiodarone initiation would be contraindicated and unlikely to improve his symptoms overall. We will continue a strategy of rate control.

## 2012-05-21 NOTE — Assessment & Plan Note (Signed)
His congestive heart failure symptoms remain unchanged, class IIIA. I've encouraged the patient to reduce his salt intake, increase his physical activity, and wear oxygen when he ambulates.

## 2012-05-21 NOTE — Patient Instructions (Addendum)
Your physician recommends that you schedule a follow-up appointment in: 3-4 months with Dr Taylor  

## 2012-05-25 ENCOUNTER — Encounter (HOSPITAL_COMMUNITY): Payer: BC Managed Care – PPO

## 2012-05-26 ENCOUNTER — Encounter (HOSPITAL_COMMUNITY): Payer: BC Managed Care – PPO

## 2012-05-28 ENCOUNTER — Encounter (HOSPITAL_COMMUNITY): Payer: BC Managed Care – PPO

## 2012-06-01 ENCOUNTER — Encounter (HOSPITAL_COMMUNITY): Payer: BC Managed Care – PPO

## 2012-06-02 ENCOUNTER — Encounter (HOSPITAL_COMMUNITY): Payer: BC Managed Care – PPO

## 2012-06-04 ENCOUNTER — Encounter (HOSPITAL_COMMUNITY): Payer: BC Managed Care – PPO

## 2012-06-08 ENCOUNTER — Encounter (HOSPITAL_COMMUNITY): Payer: BC Managed Care – PPO

## 2012-06-09 ENCOUNTER — Encounter (HOSPITAL_COMMUNITY): Payer: BC Managed Care – PPO

## 2012-06-11 ENCOUNTER — Encounter (HOSPITAL_COMMUNITY): Payer: BC Managed Care – PPO

## 2012-06-15 ENCOUNTER — Encounter (HOSPITAL_COMMUNITY): Payer: BC Managed Care – PPO

## 2012-06-16 ENCOUNTER — Encounter (HOSPITAL_COMMUNITY): Payer: BC Managed Care – PPO

## 2012-06-18 ENCOUNTER — Encounter (HOSPITAL_COMMUNITY): Payer: BC Managed Care – PPO

## 2012-06-22 ENCOUNTER — Encounter (HOSPITAL_COMMUNITY): Payer: BC Managed Care – PPO

## 2012-06-23 ENCOUNTER — Encounter (HOSPITAL_COMMUNITY): Payer: BC Managed Care – PPO

## 2012-06-25 ENCOUNTER — Encounter (HOSPITAL_COMMUNITY): Payer: BC Managed Care – PPO

## 2012-06-29 ENCOUNTER — Encounter (HOSPITAL_COMMUNITY): Payer: BC Managed Care – PPO

## 2012-06-30 ENCOUNTER — Encounter (HOSPITAL_COMMUNITY): Payer: BC Managed Care – PPO

## 2012-07-02 ENCOUNTER — Encounter (HOSPITAL_COMMUNITY): Payer: BC Managed Care – PPO

## 2012-07-06 ENCOUNTER — Encounter (HOSPITAL_COMMUNITY): Payer: BC Managed Care – PPO

## 2012-07-06 DIAGNOSIS — D649 Anemia, unspecified: Secondary | ICD-10-CM | POA: Diagnosis not present

## 2012-07-06 DIAGNOSIS — N401 Enlarged prostate with lower urinary tract symptoms: Secondary | ICD-10-CM | POA: Diagnosis not present

## 2012-07-06 DIAGNOSIS — I509 Heart failure, unspecified: Secondary | ICD-10-CM | POA: Diagnosis not present

## 2012-07-07 ENCOUNTER — Encounter (HOSPITAL_COMMUNITY): Payer: BC Managed Care – PPO

## 2012-07-09 ENCOUNTER — Encounter (HOSPITAL_COMMUNITY): Payer: BC Managed Care – PPO

## 2012-07-13 ENCOUNTER — Encounter (HOSPITAL_COMMUNITY): Payer: BC Managed Care – PPO

## 2012-07-14 ENCOUNTER — Encounter (HOSPITAL_COMMUNITY): Payer: BC Managed Care – PPO

## 2012-07-16 ENCOUNTER — Encounter (HOSPITAL_COMMUNITY): Payer: BC Managed Care – PPO

## 2012-07-20 ENCOUNTER — Encounter (HOSPITAL_COMMUNITY): Payer: BC Managed Care – PPO

## 2012-07-20 ENCOUNTER — Encounter: Payer: Self-pay | Admitting: Internal Medicine

## 2012-07-20 ENCOUNTER — Other Ambulatory Visit: Payer: Self-pay | Admitting: Internal Medicine

## 2012-07-20 ENCOUNTER — Ambulatory Visit (INDEPENDENT_AMBULATORY_CARE_PROVIDER_SITE_OTHER): Payer: Medicare Other | Admitting: *Deleted

## 2012-07-20 DIAGNOSIS — I5023 Acute on chronic systolic (congestive) heart failure: Secondary | ICD-10-CM

## 2012-07-20 DIAGNOSIS — Z95810 Presence of automatic (implantable) cardiac defibrillator: Secondary | ICD-10-CM

## 2012-07-20 DIAGNOSIS — I509 Heart failure, unspecified: Secondary | ICD-10-CM | POA: Diagnosis not present

## 2012-07-20 DIAGNOSIS — I428 Other cardiomyopathies: Secondary | ICD-10-CM

## 2012-07-21 ENCOUNTER — Encounter (HOSPITAL_COMMUNITY): Payer: BC Managed Care – PPO

## 2012-07-22 LAB — REMOTE ICD DEVICE
ATRIAL PACING ICD: 1 pct
BRDY-0002RV: 60 {beats}/min
BRDY-0004RV: 120 {beats}/min
RV LEAD AMPLITUDE: 12 mv
TZAT-0001FASTVT: 1
TZAT-0001SLOWVT: 1
TZAT-0012SLOWVT: 200 ms
TZAT-0013FASTVT: 1
TZAT-0013SLOWVT: 3
TZAT-0018SLOWVT: NEGATIVE
TZAT-0019SLOWVT: 7.5 V
TZAT-0020FASTVT: 1 ms
TZAT-0020SLOWVT: 1 ms
TZON-0004SLOWVT: 24
TZON-0005FASTVT: 6
TZON-0005SLOWVT: 6
TZON-0010FASTVT: 40 ms
TZST-0001FASTVT: 2
TZST-0001FASTVT: 5
TZST-0001SLOWVT: 4
TZST-0003FASTVT: 36 J
TZST-0003FASTVT: 40 J
TZST-0003SLOWVT: 30 J

## 2012-07-23 ENCOUNTER — Encounter (HOSPITAL_COMMUNITY): Payer: BC Managed Care – PPO

## 2012-07-27 ENCOUNTER — Encounter (HOSPITAL_COMMUNITY): Payer: BC Managed Care – PPO

## 2012-07-28 ENCOUNTER — Encounter (HOSPITAL_COMMUNITY): Payer: BC Managed Care – PPO

## 2012-07-30 ENCOUNTER — Encounter (HOSPITAL_COMMUNITY): Payer: BC Managed Care – PPO

## 2012-07-31 ENCOUNTER — Encounter: Payer: Self-pay | Admitting: *Deleted

## 2012-08-03 ENCOUNTER — Encounter (HOSPITAL_COMMUNITY): Payer: BC Managed Care – PPO

## 2012-08-04 ENCOUNTER — Encounter (HOSPITAL_COMMUNITY): Payer: BC Managed Care – PPO

## 2012-08-06 ENCOUNTER — Encounter (HOSPITAL_COMMUNITY): Payer: BC Managed Care – PPO

## 2012-08-10 ENCOUNTER — Encounter (HOSPITAL_COMMUNITY): Payer: BC Managed Care – PPO

## 2012-08-11 ENCOUNTER — Encounter (HOSPITAL_COMMUNITY): Payer: BC Managed Care – PPO

## 2012-08-13 ENCOUNTER — Encounter (HOSPITAL_COMMUNITY): Payer: BC Managed Care – PPO

## 2012-08-17 ENCOUNTER — Encounter (HOSPITAL_COMMUNITY): Payer: BC Managed Care – PPO

## 2012-08-18 ENCOUNTER — Encounter (HOSPITAL_COMMUNITY): Payer: BC Managed Care – PPO

## 2012-08-18 DIAGNOSIS — Z961 Presence of intraocular lens: Secondary | ICD-10-CM | POA: Diagnosis not present

## 2012-08-18 DIAGNOSIS — H52209 Unspecified astigmatism, unspecified eye: Secondary | ICD-10-CM | POA: Diagnosis not present

## 2012-08-18 DIAGNOSIS — H43819 Vitreous degeneration, unspecified eye: Secondary | ICD-10-CM | POA: Diagnosis not present

## 2012-08-20 ENCOUNTER — Encounter (HOSPITAL_COMMUNITY): Payer: BC Managed Care – PPO

## 2012-08-24 ENCOUNTER — Encounter (HOSPITAL_COMMUNITY): Payer: BC Managed Care – PPO

## 2012-08-25 ENCOUNTER — Encounter (HOSPITAL_COMMUNITY): Payer: BC Managed Care – PPO

## 2012-08-27 ENCOUNTER — Encounter (HOSPITAL_COMMUNITY): Payer: BC Managed Care – PPO

## 2012-08-27 DIAGNOSIS — I10 Essential (primary) hypertension: Secondary | ICD-10-CM | POA: Diagnosis not present

## 2012-08-27 DIAGNOSIS — Z125 Encounter for screening for malignant neoplasm of prostate: Secondary | ICD-10-CM | POA: Diagnosis not present

## 2012-08-31 ENCOUNTER — Encounter (HOSPITAL_COMMUNITY): Payer: BC Managed Care – PPO

## 2012-08-31 ENCOUNTER — Other Ambulatory Visit: Payer: Self-pay | Admitting: Internal Medicine

## 2012-08-31 DIAGNOSIS — L570 Actinic keratosis: Secondary | ICD-10-CM | POA: Diagnosis not present

## 2012-08-31 DIAGNOSIS — Z85828 Personal history of other malignant neoplasm of skin: Secondary | ICD-10-CM | POA: Diagnosis not present

## 2012-09-01 ENCOUNTER — Encounter (HOSPITAL_COMMUNITY): Payer: BC Managed Care – PPO

## 2012-09-01 DIAGNOSIS — Z1212 Encounter for screening for malignant neoplasm of rectum: Secondary | ICD-10-CM | POA: Diagnosis not present

## 2012-09-03 ENCOUNTER — Encounter (HOSPITAL_COMMUNITY): Payer: BC Managed Care – PPO

## 2012-09-03 DIAGNOSIS — I10 Essential (primary) hypertension: Secondary | ICD-10-CM | POA: Diagnosis not present

## 2012-09-03 DIAGNOSIS — Z6826 Body mass index (BMI) 26.0-26.9, adult: Secondary | ICD-10-CM | POA: Diagnosis not present

## 2012-09-03 DIAGNOSIS — Z0000 Encounter for general adult medical examination without abnormal findings: Secondary | ICD-10-CM | POA: Diagnosis not present

## 2012-09-03 DIAGNOSIS — I509 Heart failure, unspecified: Secondary | ICD-10-CM | POA: Diagnosis not present

## 2012-09-03 DIAGNOSIS — E119 Type 2 diabetes mellitus without complications: Secondary | ICD-10-CM | POA: Diagnosis not present

## 2012-09-03 DIAGNOSIS — J438 Other emphysema: Secondary | ICD-10-CM | POA: Diagnosis not present

## 2012-09-07 ENCOUNTER — Encounter (HOSPITAL_COMMUNITY): Payer: BC Managed Care – PPO

## 2012-09-08 ENCOUNTER — Encounter (HOSPITAL_COMMUNITY): Payer: BC Managed Care – PPO

## 2012-09-10 ENCOUNTER — Encounter (HOSPITAL_COMMUNITY): Payer: BC Managed Care – PPO

## 2012-09-14 ENCOUNTER — Encounter (HOSPITAL_COMMUNITY): Payer: BC Managed Care – PPO

## 2012-09-15 ENCOUNTER — Encounter (HOSPITAL_COMMUNITY): Payer: BC Managed Care – PPO

## 2012-09-17 ENCOUNTER — Encounter (HOSPITAL_COMMUNITY): Payer: BC Managed Care – PPO

## 2012-09-21 ENCOUNTER — Encounter (HOSPITAL_COMMUNITY): Payer: BC Managed Care – PPO

## 2012-09-22 ENCOUNTER — Encounter (HOSPITAL_COMMUNITY): Payer: BC Managed Care – PPO

## 2012-09-24 ENCOUNTER — Encounter (HOSPITAL_COMMUNITY): Payer: BC Managed Care – PPO

## 2012-09-29 ENCOUNTER — Encounter: Payer: Self-pay | Admitting: Internal Medicine

## 2012-09-29 ENCOUNTER — Encounter (HOSPITAL_COMMUNITY): Payer: BC Managed Care – PPO

## 2012-09-29 ENCOUNTER — Ambulatory Visit (INDEPENDENT_AMBULATORY_CARE_PROVIDER_SITE_OTHER): Payer: Medicare Other | Admitting: Internal Medicine

## 2012-09-29 DIAGNOSIS — I428 Other cardiomyopathies: Secondary | ICD-10-CM

## 2012-09-29 DIAGNOSIS — Z95810 Presence of automatic (implantable) cardiac defibrillator: Secondary | ICD-10-CM | POA: Diagnosis not present

## 2012-09-29 DIAGNOSIS — I509 Heart failure, unspecified: Secondary | ICD-10-CM

## 2012-09-29 DIAGNOSIS — I5023 Acute on chronic systolic (congestive) heart failure: Secondary | ICD-10-CM

## 2012-09-29 DIAGNOSIS — I10 Essential (primary) hypertension: Secondary | ICD-10-CM | POA: Diagnosis not present

## 2012-09-29 LAB — ICD DEVICE OBSERVATION
AL AMPLITUDE: 2.1 mv
AL IMPEDENCE ICD: 462.5 Ohm
ATRIAL PACING ICD: 0.01 pct
BAMS-0001: 150 {beats}/min
DEVICE MODEL ICD: 623851
FVT: 0
LV LEAD IMPEDENCE ICD: 462.5 Ohm
LV LEAD THRESHOLD: 1 V
MODE SWITCH EPISODES: 0
RV LEAD AMPLITUDE: 12 mv
TZAT-0001FASTVT: 1
TZAT-0001SLOWVT: 1
TZAT-0004FASTVT: 8
TZAT-0004SLOWVT: 8
TZAT-0012FASTVT: 200 ms
TZAT-0013FASTVT: 1
TZAT-0019SLOWVT: 7.5 V
TZAT-0020FASTVT: 1 ms
TZAT-0020SLOWVT: 1 ms
TZON-0004SLOWVT: 24
TZON-0010SLOWVT: 40 ms
TZST-0001FASTVT: 3
TZST-0001FASTVT: 5
TZST-0001SLOWVT: 3
TZST-0001SLOWVT: 4
TZST-0003FASTVT: 20 J
TZST-0003SLOWVT: 20 J
TZST-0003SLOWVT: 40 J
VENTRICULAR PACING ICD: 82 pct
VF: 0

## 2012-09-29 NOTE — Patient Instructions (Addendum)
Your physician wants you to follow-up in: 12 months with Dr Court Joy will receive a reminder letter in the mail two months in advance. If you don't receive a letter, please call our office to schedule the follow-up appointment.    Remote monitoring is used to monitor your Pacemaker of ICD from home. This monitoring reduces the number of office visits required to check your device to one time per year. It allows [place] to keep an eye on the functioning of your device to ensure it is working properly. You are scheduled for a device check from home on 01/05/13. You may send your transmission at any time that day. If you have a wireless device, the transmission will be sent automatically. After your physician reviews your transmission, you will receive a postcard with your next transmission date.

## 2012-09-29 NOTE — Assessment & Plan Note (Signed)
His heart failure symptoms are well compensated class IIA. He will continue his current medical therapy and maintain a low-sodium diet.

## 2012-09-29 NOTE — Progress Notes (Signed)
HPI [name] returns today for followup. He is a very pleasant 79 year old man with a history of chronic atrial fibrillation, complete heart block, chronic systolic heart failure, status post biventricular ICD implantation. He has underlying nonischemic cardiomyopathy. His heart failure symptoms have been class II. He denies chest pain or shortness of breath. No dietary indiscretion. No syncope. No Known Allergies   Current Outpatient Prescriptions  Medication Sig Dispense Refill  . apixaban (ELIQUIS) 5 MG TABS tablet Take 5 mg by mouth 2 (two) times daily. Every 12 hrs      . carvedilol (COREG) 6.25 MG tablet TAKE 1 TABLET BY MOUTH TWICE DAILY WITH A MEAL  180 tablet  0  . furosemide (LASIX) 40 MG tablet Take 2 tablets by mouth every AM and 1 tablet in the afternoon around 2pm      . niacin (NIASPAN) 500 MG CR tablet Take 1 tablet (500 mg total) by mouth daily.  30 tablet  5  . potassium chloride SA (K-DUR,KLOR-CON) 20 MEQ tablet Take one tablet by mouth every AM and 1/2 tablet every PM      . Tamsulosin HCl (FLOMAX) 0.4 MG CAPS Take 0.4 mg by mouth at bedtime.         No current facility-administered medications for this visit.     Past Medical History  Diagnosis Date  . Automatic implantable cardiac defibrillator in situ     BiV pacer  . NICM (nonischemic cardiomyopathy)     nicm ef 30-35%,nonob cad-cath 2006,echo 2/12:ef30-35%.mod lvh,inf hk,mild-mod lae and mild rae,pasp 35  . Systolic CHF   . Unspecified essential hypertension   . Atrial fibrillation   . S/P mitral valve repair     2006  . HTN (hypertension)   . HLD (hyperlipidemia)   . Coronary artery disease, non-occlusive     cath 2006 pLM 20%, pLAD 40%; mCFX 20%  . PFO (patent foramen ovale)     s/p repair 2006  . Chronic systolic CHF (congestive heart failure)   . ICD (implantable cardiac defibrillator) in place     BIV ICD  . Pacemaker     BIV ICD  . Shortness of breath     ROS:   All systems reviewed and  negative except as noted in the HPI.   Past Surgical History  Procedure Laterality Date  . Rt. shoulder    . Bilateral knee arthroscopy    . Colonoscopy with polypectomy    . Intraoperative transesophageal echocardiogram    . Median sterotomy for mitral valve repair    . Implantation of a dual chamber biventricular icd    . Hip pinning  3/2/012     Family History  Problem Relation Age of Onset  . Heart disease Neg Hx     Rheumatic or valvular heart disease   . Cardiomyopathy Neg Hx      History   Social History  . Marital Status: Married    Spouse Name: N/A    Number of Children: 6  . Years of Education: N/A   Occupational History  . 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 [employer] in his church    Social History Main Topics  . Smoking status: Former Smoker -- 1.00 packs/day for 5 years    Quit date: 05/06/1961  . Smokeless tobacco: Former Neurosurgeon    Quit date: 05/07/1951  . Alcohol Use: No  . Drug Use: No  . Sexually Active: Not Currently   Other Topics  Concern  . Not on file   Social History Narrative   Lives with his wife in [place].   Has 6 grown children.   Remains quite active.   Blood pressure 130/78, pulse 60, respirations 18  Physical Exam:  Well appearing NAD HEENT: Unremarkable Neck:  No JVD, no thyromegally Lungs:  Clear with no wheezes, rales, or rhonchi. HEART:  Regular rate rhythm, no murmurs, no rubs, no clicks Abd:  soft, positive bowel sounds, no organomegally, no rebound, no guarding Ext:  2 plus pulses, no edema, no cyanosis, no clubbing Skin:  No rashes no nodules Neuro:  CN II through XII intact, motor grossly intact  DEVICE  Normal device function.  See PaceArt for details.   Assess/Plan:

## 2012-09-29 NOTE — Assessment & Plan Note (Signed)
His blood pressure is well controlled. No change in medical therapy. 

## 2012-09-29 NOTE — Assessment & Plan Note (Signed)
His [employer] Jude biventricular ICD is working normally. No change in medical therapy.

## 2012-10-01 ENCOUNTER — Encounter (HOSPITAL_COMMUNITY): Payer: BC Managed Care – PPO

## 2012-10-05 ENCOUNTER — Encounter (HOSPITAL_COMMUNITY): Payer: BC Managed Care – PPO

## 2012-10-06 ENCOUNTER — Encounter (HOSPITAL_COMMUNITY): Payer: BC Managed Care – PPO

## 2012-10-08 ENCOUNTER — Encounter (HOSPITAL_COMMUNITY): Payer: BC Managed Care – PPO

## 2012-10-12 ENCOUNTER — Encounter (HOSPITAL_COMMUNITY): Payer: BC Managed Care – PPO

## 2012-10-13 ENCOUNTER — Encounter (HOSPITAL_COMMUNITY): Payer: BC Managed Care – PPO

## 2012-10-15 ENCOUNTER — Encounter (HOSPITAL_COMMUNITY): Payer: BC Managed Care – PPO

## 2012-10-19 ENCOUNTER — Encounter (HOSPITAL_COMMUNITY): Payer: BC Managed Care – PPO

## 2012-10-20 ENCOUNTER — Encounter (HOSPITAL_COMMUNITY): Payer: BC Managed Care – PPO

## 2012-10-22 ENCOUNTER — Encounter (HOSPITAL_COMMUNITY): Payer: BC Managed Care – PPO

## 2012-10-25 ENCOUNTER — Other Ambulatory Visit (HOSPITAL_COMMUNITY): Payer: Self-pay | Admitting: Nurse Practitioner

## 2012-10-26 ENCOUNTER — Encounter (HOSPITAL_COMMUNITY): Payer: BC Managed Care – PPO

## 2012-10-27 ENCOUNTER — Encounter (HOSPITAL_COMMUNITY): Payer: BC Managed Care – PPO

## 2012-10-29 ENCOUNTER — Encounter (HOSPITAL_COMMUNITY): Payer: BC Managed Care – PPO

## 2012-11-02 ENCOUNTER — Encounter (HOSPITAL_COMMUNITY): Payer: BC Managed Care – PPO

## 2012-11-03 ENCOUNTER — Encounter (HOSPITAL_COMMUNITY): Payer: BC Managed Care – PPO

## 2012-11-04 ENCOUNTER — Other Ambulatory Visit: Payer: Self-pay | Admitting: *Deleted

## 2012-11-04 DIAGNOSIS — I4891 Unspecified atrial fibrillation: Secondary | ICD-10-CM

## 2012-11-04 MED ORDER — APIXABAN 5 MG PO TABS: 5.0000 mg | ORAL_TABLET | Freq: Two times a day (BID) | ORAL | Status: DC

## 2012-11-04 MED ORDER — CARVEDILOL 6.25 MG PO TABS: ORAL_TABLET | ORAL | Status: DC

## 2012-11-05 ENCOUNTER — Encounter (HOSPITAL_COMMUNITY): Payer: BC Managed Care – PPO

## 2012-11-09 ENCOUNTER — Encounter (HOSPITAL_COMMUNITY): Payer: BC Managed Care – PPO

## 2012-11-10 ENCOUNTER — Encounter (HOSPITAL_COMMUNITY): Payer: BC Managed Care – PPO

## 2012-11-12 ENCOUNTER — Encounter (HOSPITAL_COMMUNITY): Payer: BC Managed Care – PPO

## 2012-11-16 ENCOUNTER — Encounter (HOSPITAL_COMMUNITY): Payer: BC Managed Care – PPO

## 2012-11-17 ENCOUNTER — Encounter (HOSPITAL_COMMUNITY): Payer: BC Managed Care – PPO

## 2012-11-18 ENCOUNTER — Other Ambulatory Visit: Payer: Self-pay | Admitting: Internal Medicine

## 2012-11-19 ENCOUNTER — Encounter (HOSPITAL_COMMUNITY): Payer: BC Managed Care – PPO

## 2012-11-23 ENCOUNTER — Encounter (HOSPITAL_COMMUNITY): Payer: BC Managed Care – PPO

## 2012-11-24 ENCOUNTER — Encounter (HOSPITAL_COMMUNITY): Payer: BC Managed Care – PPO

## 2012-11-26 ENCOUNTER — Encounter (HOSPITAL_COMMUNITY): Payer: BC Managed Care – PPO

## 2012-11-30 ENCOUNTER — Encounter (HOSPITAL_COMMUNITY): Payer: BC Managed Care – PPO

## 2012-12-01 ENCOUNTER — Encounter (HOSPITAL_COMMUNITY): Payer: BC Managed Care – PPO

## 2012-12-03 ENCOUNTER — Encounter (HOSPITAL_COMMUNITY): Payer: BC Managed Care – PPO

## 2012-12-07 ENCOUNTER — Encounter (HOSPITAL_COMMUNITY): Payer: BC Managed Care – PPO

## 2012-12-07 DIAGNOSIS — E119 Type 2 diabetes mellitus without complications: Secondary | ICD-10-CM | POA: Diagnosis not present

## 2012-12-07 DIAGNOSIS — J438 Other emphysema: Secondary | ICD-10-CM | POA: Diagnosis not present

## 2012-12-07 DIAGNOSIS — I509 Heart failure, unspecified: Secondary | ICD-10-CM | POA: Diagnosis not present

## 2012-12-07 DIAGNOSIS — I10 Essential (primary) hypertension: Secondary | ICD-10-CM | POA: Diagnosis not present

## 2012-12-07 DIAGNOSIS — D649 Anemia, unspecified: Secondary | ICD-10-CM | POA: Diagnosis not present

## 2012-12-07 DIAGNOSIS — N401 Enlarged prostate with lower urinary tract symptoms: Secondary | ICD-10-CM | POA: Diagnosis not present

## 2012-12-07 DIAGNOSIS — I428 Other cardiomyopathies: Secondary | ICD-10-CM | POA: Diagnosis not present

## 2012-12-08 ENCOUNTER — Encounter (HOSPITAL_COMMUNITY): Payer: BC Managed Care – PPO

## 2012-12-10 ENCOUNTER — Encounter (HOSPITAL_COMMUNITY): Payer: BC Managed Care – PPO

## 2012-12-13 ENCOUNTER — Other Ambulatory Visit: Payer: Self-pay | Admitting: Internal Medicine

## 2012-12-14 ENCOUNTER — Other Ambulatory Visit: Payer: Self-pay | Admitting: Internal Medicine

## 2012-12-14 ENCOUNTER — Encounter (HOSPITAL_COMMUNITY): Payer: BC Managed Care – PPO

## 2012-12-15 ENCOUNTER — Encounter (HOSPITAL_COMMUNITY): Payer: BC Managed Care – PPO

## 2012-12-17 ENCOUNTER — Encounter (HOSPITAL_COMMUNITY): Payer: BC Managed Care – PPO

## 2012-12-21 ENCOUNTER — Encounter (HOSPITAL_COMMUNITY): Payer: BC Managed Care – PPO

## 2012-12-22 ENCOUNTER — Encounter (HOSPITAL_COMMUNITY): Payer: BC Managed Care – PPO

## 2012-12-24 ENCOUNTER — Encounter (HOSPITAL_COMMUNITY): Payer: BC Managed Care – PPO

## 2012-12-28 ENCOUNTER — Encounter (HOSPITAL_COMMUNITY): Payer: BC Managed Care – PPO

## 2012-12-29 ENCOUNTER — Encounter (HOSPITAL_COMMUNITY): Payer: BC Managed Care – PPO

## 2012-12-31 ENCOUNTER — Encounter (HOSPITAL_COMMUNITY): Payer: BC Managed Care – PPO

## 2013-01-05 ENCOUNTER — Encounter (HOSPITAL_COMMUNITY): Payer: BC Managed Care – PPO

## 2013-01-05 ENCOUNTER — Encounter: Payer: Self-pay | Admitting: Internal Medicine

## 2013-01-05 ENCOUNTER — Ambulatory Visit (INDEPENDENT_AMBULATORY_CARE_PROVIDER_SITE_OTHER): Payer: Medicare Other | Admitting: *Deleted

## 2013-01-05 DIAGNOSIS — Z95810 Presence of automatic (implantable) cardiac defibrillator: Secondary | ICD-10-CM

## 2013-01-05 DIAGNOSIS — I428 Other cardiomyopathies: Secondary | ICD-10-CM

## 2013-01-05 DIAGNOSIS — I5023 Acute on chronic systolic (congestive) heart failure: Secondary | ICD-10-CM | POA: Diagnosis not present

## 2013-01-05 DIAGNOSIS — I509 Heart failure, unspecified: Secondary | ICD-10-CM | POA: Diagnosis not present

## 2013-01-07 ENCOUNTER — Encounter (HOSPITAL_COMMUNITY): Payer: BC Managed Care – PPO

## 2013-01-07 LAB — REMOTE ICD DEVICE
ATRIAL PACING ICD: 1 pct
BAMS-0001: 150 {beats}/min
BRDY-0002RV: 60 {beats}/min
DEVICE MODEL ICD: 623851
TZAT-0001FASTVT: 1
TZAT-0004SLOWVT: 8
TZAT-0012FASTVT: 200 ms
TZAT-0012SLOWVT: 200 ms
TZAT-0013SLOWVT: 3
TZAT-0018SLOWVT: NEGATIVE
TZAT-0019FASTVT: 7.5 V
TZAT-0019SLOWVT: 7.5 V
TZAT-0020FASTVT: 1 ms
TZON-0003SLOWVT: 375 ms
TZON-0004FASTVT: 16
TZON-0004SLOWVT: 24
TZON-0005FASTVT: 6
TZON-0010FASTVT: 40 ms
TZON-0010SLOWVT: 40 ms
TZST-0001FASTVT: 4
TZST-0001SLOWVT: 3
TZST-0001SLOWVT: 5
TZST-0003FASTVT: 20 J
TZST-0003SLOWVT: 30 J
VENTRICULAR PACING ICD: 87 pct

## 2013-01-11 ENCOUNTER — Encounter (HOSPITAL_COMMUNITY): Payer: BC Managed Care – PPO

## 2013-01-12 ENCOUNTER — Encounter (HOSPITAL_COMMUNITY): Payer: BC Managed Care – PPO

## 2013-01-14 ENCOUNTER — Encounter (HOSPITAL_COMMUNITY): Payer: BC Managed Care – PPO

## 2013-01-15 NOTE — Progress Notes (Signed)
ICD remote. All functions normal, no NSVT. Full details in PaceArt.  100% mode switch---no coumadin due to fall risk.  Merlin 04/12/13.

## 2013-01-18 ENCOUNTER — Other Ambulatory Visit: Payer: Self-pay | Admitting: Internal Medicine

## 2013-01-18 ENCOUNTER — Encounter (HOSPITAL_COMMUNITY): Payer: BC Managed Care – PPO

## 2013-01-19 ENCOUNTER — Encounter (HOSPITAL_COMMUNITY): Payer: BC Managed Care – PPO

## 2013-01-21 ENCOUNTER — Encounter (HOSPITAL_COMMUNITY): Payer: BC Managed Care – PPO

## 2013-01-25 ENCOUNTER — Encounter (HOSPITAL_COMMUNITY): Payer: BC Managed Care – PPO

## 2013-01-26 ENCOUNTER — Encounter (HOSPITAL_COMMUNITY): Payer: BC Managed Care – PPO

## 2013-01-28 ENCOUNTER — Encounter (HOSPITAL_COMMUNITY): Payer: BC Managed Care – PPO

## 2013-02-01 ENCOUNTER — Encounter (HOSPITAL_COMMUNITY): Payer: BC Managed Care – PPO

## 2013-02-02 ENCOUNTER — Encounter (HOSPITAL_COMMUNITY): Payer: BC Managed Care – PPO

## 2013-02-04 ENCOUNTER — Encounter (HOSPITAL_COMMUNITY): Payer: BC Managed Care – PPO

## 2013-02-08 ENCOUNTER — Encounter (HOSPITAL_COMMUNITY): Payer: BC Managed Care – PPO

## 2013-02-09 ENCOUNTER — Encounter (HOSPITAL_COMMUNITY): Payer: BC Managed Care – PPO

## 2013-02-11 ENCOUNTER — Encounter (HOSPITAL_COMMUNITY): Payer: BC Managed Care – PPO

## 2013-02-15 ENCOUNTER — Encounter (HOSPITAL_COMMUNITY): Payer: BC Managed Care – PPO

## 2013-02-16 ENCOUNTER — Encounter (HOSPITAL_COMMUNITY): Payer: BC Managed Care – PPO

## 2013-02-16 ENCOUNTER — Encounter: Payer: Self-pay | Admitting: *Deleted

## 2013-02-18 ENCOUNTER — Encounter (HOSPITAL_COMMUNITY): Payer: BC Managed Care – PPO

## 2013-02-18 IMAGING — CR DG HIP COMPLETE 2+V*R*
2 series · 2 of 2 positions shown · non-contrast
Comparison: None.

CLINICAL DATA: Pain post fall

RIGHT HIP - COMPLETE 2+ VIEW

[t pelvis a.p.]
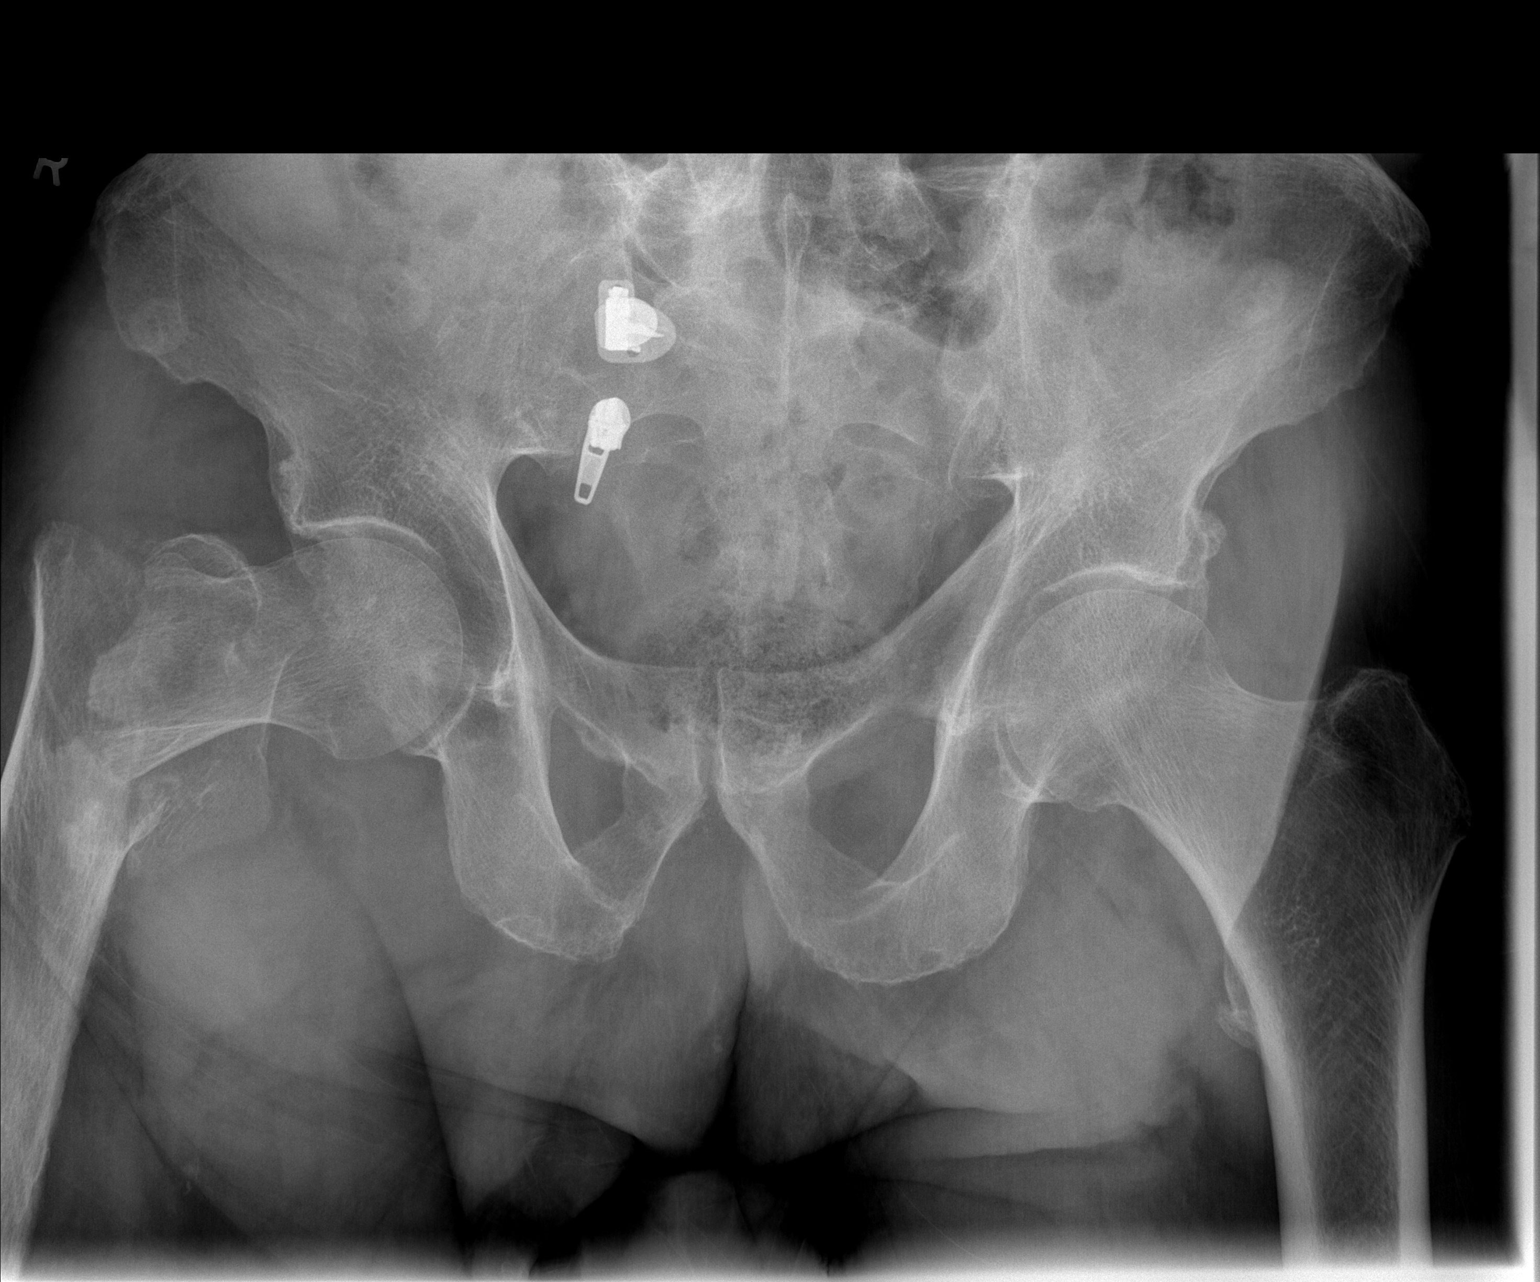

[t hip ap right]
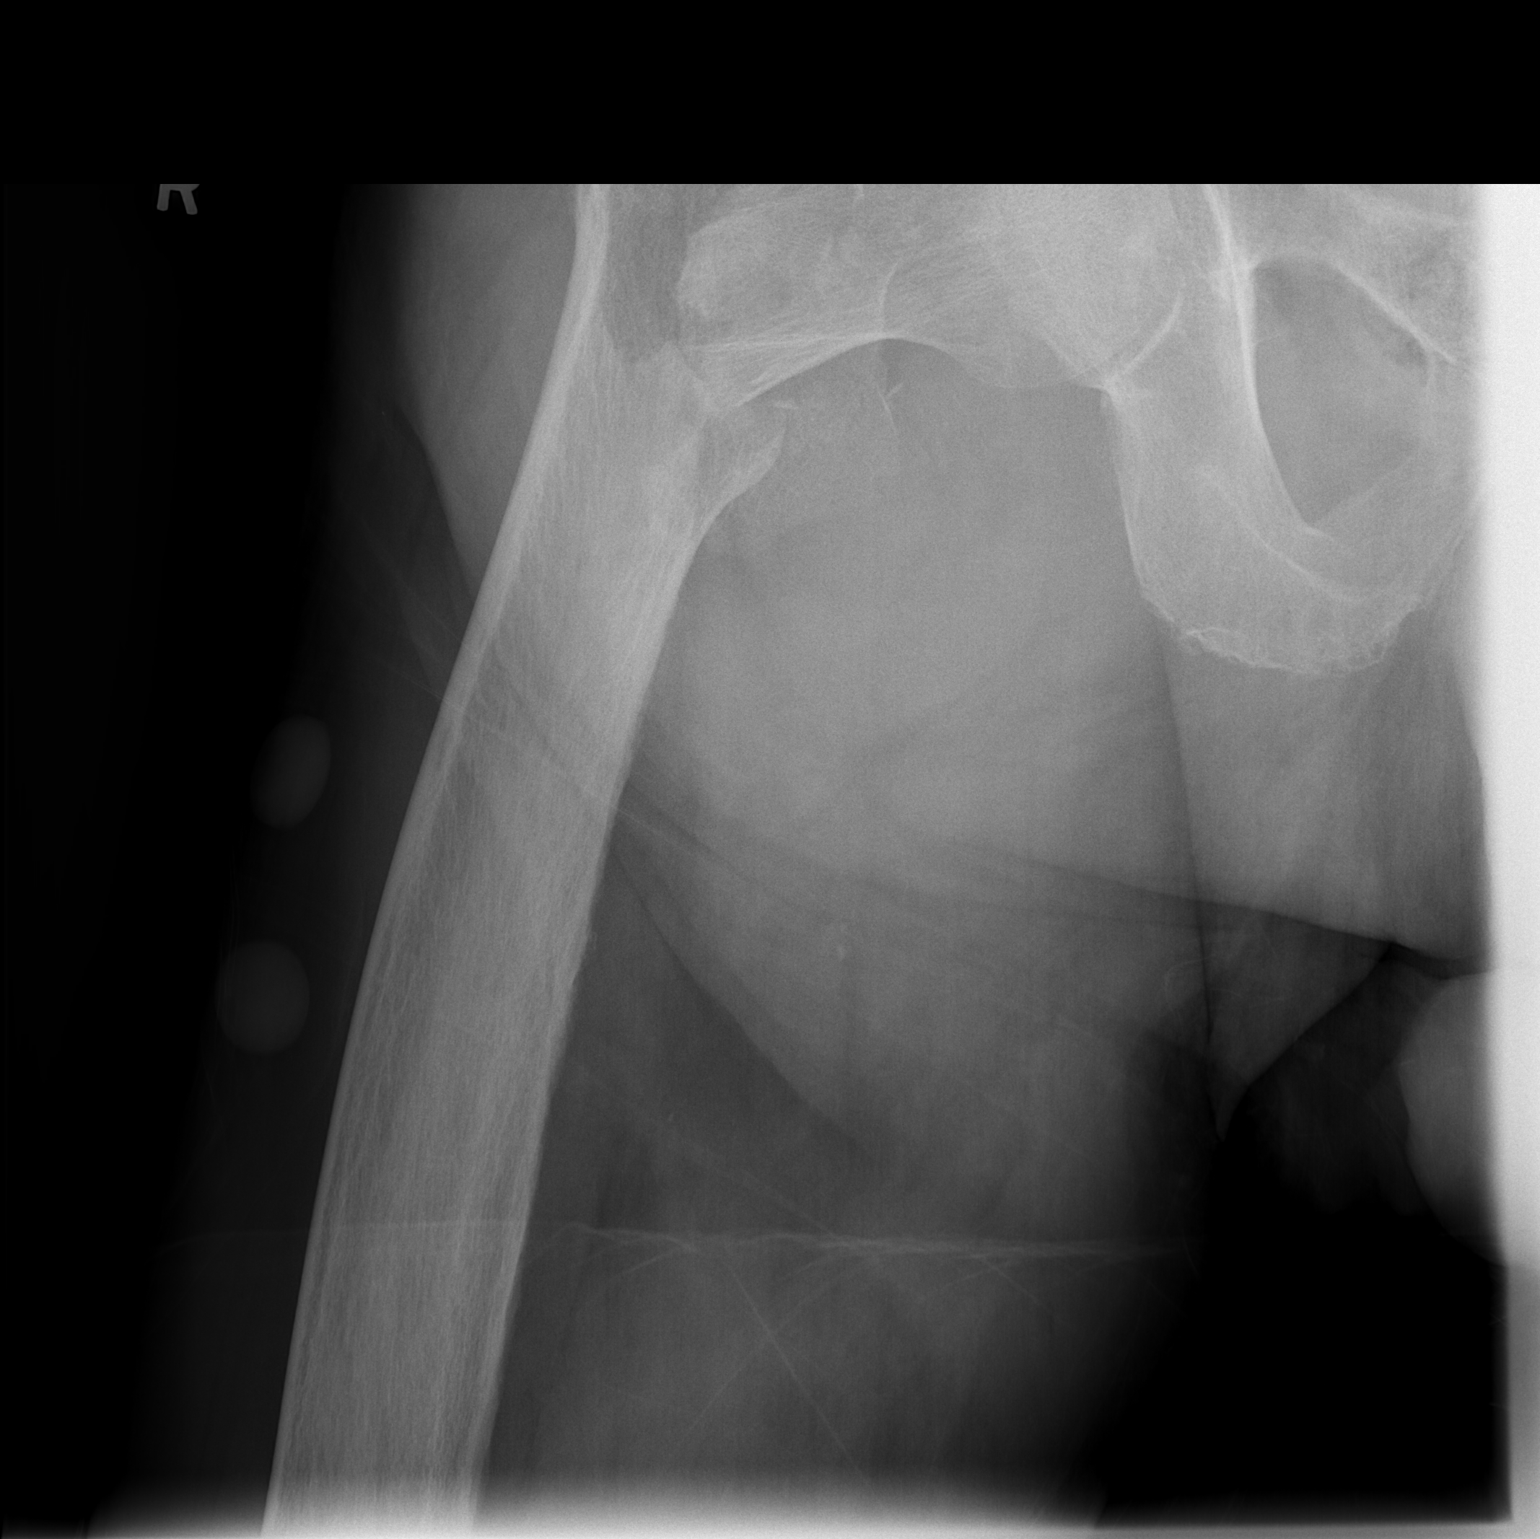

[2 of 2 positions shown; findings below may reference images not displayed]

FINDINGS: Three views of the right hip submitted.  There is
displaced intertrochanteric fracture of the proximal right femur.
IMPRESSION: Displaced intertrochanteric fracture of the proximal right femur.

## 2013-02-19 ENCOUNTER — Telehealth: Payer: Self-pay | Admitting: Pulmonary Disease

## 2013-02-19 NOTE — Telephone Encounter (Signed)
Could we work this pt in anywhere on KC's schedule? Thanks!

## 2013-02-22 ENCOUNTER — Encounter (HOSPITAL_COMMUNITY): Payer: BC Managed Care – PPO

## 2013-02-22 NOTE — Telephone Encounter (Signed)
LMOM x 2 

## 2013-02-22 NOTE — Telephone Encounter (Signed)
lmomtcb x1 

## 2013-02-23 ENCOUNTER — Encounter (HOSPITAL_COMMUNITY): Payer: BC Managed Care – PPO

## 2013-02-24 ENCOUNTER — Ambulatory Visit (INDEPENDENT_AMBULATORY_CARE_PROVIDER_SITE_OTHER): Payer: Medicare Other | Admitting: Pulmonary Disease

## 2013-02-24 ENCOUNTER — Encounter: Payer: Self-pay | Admitting: Pulmonary Disease

## 2013-02-24 ENCOUNTER — Encounter (INDEPENDENT_AMBULATORY_CARE_PROVIDER_SITE_OTHER): Payer: Self-pay

## 2013-02-24 VITALS — BP 116/74 | HR 64 | Temp 98.0°F | Ht 66.0 in | Wt 172.4 lb

## 2013-02-24 DIAGNOSIS — R0902 Hypoxemia: Secondary | ICD-10-CM | POA: Diagnosis not present

## 2013-02-24 NOTE — Telephone Encounter (Signed)
Pt was given appt  With Dr Shelle Iron 02/24/13 at 4:30.

## 2013-02-24 NOTE — Patient Instructions (Signed)
Try and wear your oxygen with exertional activities, and definitely while sleeping Make sure you keep up with your cardiology visit. Make sure you get your flu shot soon. followup with me as needed.

## 2013-02-24 NOTE — Progress Notes (Signed)
  Subjective:    Patient ID: Arthur Armstrong, male    DOB: [birth year], 79 y.o.   MRN: [id number]  HPI The patient comes in today for followup of his known chronic hypoxemia, secondary to his cardiomyopathy with chronic congestive heart failure.  He has had a pulmonary workup last year, with no specific etiology being found for his hypoxemia.  The patient is maintained on oxygen, but continues to be noncompliant with exertional oxygen.  I tried to explain to him that he should wear his oxygen anytime he leaves his home.  He also continues on oxygen at night while sleeping.   Review of Systems  Constitutional: Negative for fever and unexpected weight change.  HENT: Negative for congestion, dental problem, ear pain, nosebleeds, postnasal drip, rhinorrhea, sinus pressure, sneezing, sore throat and trouble swallowing.   Eyes: Negative for redness and itching.  Respiratory: Positive for shortness of breath. Negative for cough and wheezing.   Cardiovascular: Negative for palpitations and leg swelling.  Gastrointestinal: Negative for nausea and vomiting.  Genitourinary: Negative for dysuria.  Musculoskeletal: Negative for joint swelling.  Skin: Negative for rash.  Neurological: Negative for headaches.  Hematological: Does not bruise/bleed easily.  Psychiatric/Behavioral: Negative for dysphoric mood. The patient is not nervous/anxious.        Objective:   Physical Exam Frail-appearing male in no acute distress Nose without purulence or discharge noted Neck without thyromegaly, no lymphadenopathy Chest with bibasilar crackles, no wheezes or rhonchi Cardiac exam with slight irregularity, 2/6 systolic murmur Lower extremities with minimal edema, no cyanosis Alert and oriented, moves all 4 extremities.       Assessment & Plan:

## 2013-02-24 NOTE — Assessment & Plan Note (Signed)
The patient has chronic hypoxemia on the basis of a severe cardiomyopathy with chronic congestive heart failure.  He is been wearing oxygen while sleeping, but has not been wearing oxygen with exertion.  He did desaturate today walking from the waiting room, but tells me that his oxygen tank is in the car.  I have asked him to try and wear this as much as possible since he walks out his front door of his home.  Seeing how his hypoxemia is really from his chronic chf more than anything else,I have asked him to follow up with me only as needed.

## 2013-02-25 ENCOUNTER — Encounter (HOSPITAL_COMMUNITY): Payer: BC Managed Care – PPO

## 2013-03-01 ENCOUNTER — Encounter (HOSPITAL_COMMUNITY): Payer: BC Managed Care – PPO

## 2013-03-01 DIAGNOSIS — Z85828 Personal history of other malignant neoplasm of skin: Secondary | ICD-10-CM | POA: Diagnosis not present

## 2013-03-01 DIAGNOSIS — L570 Actinic keratosis: Secondary | ICD-10-CM | POA: Diagnosis not present

## 2013-03-02 ENCOUNTER — Encounter (HOSPITAL_COMMUNITY): Payer: BC Managed Care – PPO

## 2013-03-04 ENCOUNTER — Encounter (HOSPITAL_COMMUNITY): Payer: BC Managed Care – PPO

## 2013-03-09 DIAGNOSIS — I428 Other cardiomyopathies: Secondary | ICD-10-CM | POA: Diagnosis not present

## 2013-03-09 DIAGNOSIS — D649 Anemia, unspecified: Secondary | ICD-10-CM | POA: Diagnosis not present

## 2013-03-09 DIAGNOSIS — J438 Other emphysema: Secondary | ICD-10-CM | POA: Diagnosis not present

## 2013-03-09 DIAGNOSIS — I509 Heart failure, unspecified: Secondary | ICD-10-CM | POA: Diagnosis not present

## 2013-03-09 DIAGNOSIS — Z9981 Dependence on supplemental oxygen: Secondary | ICD-10-CM | POA: Diagnosis not present

## 2013-03-09 DIAGNOSIS — I4891 Unspecified atrial fibrillation: Secondary | ICD-10-CM | POA: Diagnosis not present

## 2013-03-09 DIAGNOSIS — Z1331 Encounter for screening for depression: Secondary | ICD-10-CM | POA: Diagnosis not present

## 2013-03-09 DIAGNOSIS — E1129 Type 2 diabetes mellitus with other diabetic kidney complication: Secondary | ICD-10-CM | POA: Diagnosis not present

## 2013-03-09 DIAGNOSIS — Z23 Encounter for immunization: Secondary | ICD-10-CM | POA: Diagnosis not present

## 2013-03-16 ENCOUNTER — Encounter: Payer: Self-pay | Admitting: Internal Medicine

## 2013-03-16 ENCOUNTER — Encounter: Payer: Medicare Other | Admitting: *Deleted

## 2013-03-16 ENCOUNTER — Ambulatory Visit (INDEPENDENT_AMBULATORY_CARE_PROVIDER_SITE_OTHER): Payer: Medicare Other | Admitting: Internal Medicine

## 2013-03-16 VITALS — BP 111/74 | HR 60 | Ht 65.0 in | Wt 169.6 lb

## 2013-03-16 DIAGNOSIS — I509 Heart failure, unspecified: Secondary | ICD-10-CM | POA: Diagnosis not present

## 2013-03-16 DIAGNOSIS — I428 Other cardiomyopathies: Secondary | ICD-10-CM

## 2013-03-16 DIAGNOSIS — I4891 Unspecified atrial fibrillation: Secondary | ICD-10-CM

## 2013-03-16 DIAGNOSIS — I5023 Acute on chronic systolic (congestive) heart failure: Secondary | ICD-10-CM

## 2013-03-16 DIAGNOSIS — Z95810 Presence of automatic (implantable) cardiac defibrillator: Secondary | ICD-10-CM

## 2013-03-16 LAB — MDC_IDC_ENUM_SESS_TYPE_INCLINIC
HighPow Impedance: 46 Ohm
Implantable Pulse Generator Serial Number: 623851
Lead Channel Pacing Threshold Amplitude: 0.5 V
Lead Channel Pacing Threshold Amplitude: 0.75 V
Lead Channel Pacing Threshold Pulse Width: 0.5 ms
Lead Channel Pacing Threshold Pulse Width: 0.8 ms
Lead Channel Sensing Intrinsic Amplitude: 0.9 mV
Lead Channel Setting Pacing Amplitude: 2 V
Lead Channel Setting Pacing Pulse Width: 0.5 ms
Zone Setting Detection Interval: 250 ms
Zone Setting Detection Interval: 300 ms

## 2013-03-16 NOTE — Patient Instructions (Addendum)
Remote monitoring is used to monitor your ICD from home. This monitoring reduces the number of office visits required to check your device to one time per year. It allows [place] to keep an eye on the functioning of your device to ensure it is working properly. You are scheduled for a device check from home on 06-17-2013. You may send your transmission at any time that day. If you have a wireless device, the transmission will be sent automatically. After your physician reviews your transmission, you will receive a postcard with your next transmission date.  Your physician recommends that you schedule a follow-up appointment in: 6 months with Dr. Ladona Ridgel

## 2013-03-16 NOTE — Assessment & Plan Note (Signed)
His [employer] Jude biventricular ICD is working normally. We'll plan to recheck in several months. 

## 2013-03-16 NOTE — Progress Notes (Signed)
HPI Arthur Armstrong returns today for followup. He is a very pleasant 79 year old man with a history of chronic atrial fibrillation, complete heart block, chronic systolic heart failure, status post biventricular ICD implantation. He has underlying nonischemic cardiomyopathy. His heart failure symptoms have been class II. He denies chest pain or shortness of breath. No dietary indiscretion. No syncope. No Known Allergies   Current Outpatient Prescriptions  Medication Sig Dispense Refill  . apixaban (ELIQUIS) 5 MG TABS tablet Take 1 tablet (5 mg total) by mouth 2 (two) times daily. Every 12 hrs  60 tablet  11  . carvedilol (COREG) 6.25 MG tablet TAKE 1 TABLET BY MOUTH TWICE DAILY WITH A MEAL  180 tablet  3  . furosemide (LASIX) 40 MG tablet TAKE 2 TABLETS BY MOUTH TWICE A DAY  120 tablet  6  . niacin (NIASPAN) 500 MG CR tablet TAKE 1 TABLET BY MOUTH DAILY  30 tablet  6  . potassium chloride SA (K-DUR,KLOR-CON) 20 MEQ tablet 2 tablets twice a day      . Tamsulosin HCl (FLOMAX) 0.4 MG CAPS Take 0.4 mg by mouth at bedtime.        . valsartan (DIOVAN) 80 MG tablet 80 mg 2 (two) times daily.       No current facility-administered medications for this visit.     Past Medical History  Diagnosis Date  . Automatic implantable cardiac defibrillator in situ     BiV pacer  . NICM (nonischemic cardiomyopathy)     nicm ef 30-35%,nonob cad-cath 2006,echo 2/12:ef30-35%.mod lvh,inf hk,mild-mod lae and mild rae,pasp 35  . Systolic CHF   . Unspecified essential hypertension   . Atrial fibrillation   . S/P mitral valve repair     2006  . HTN (hypertension)   . HLD (hyperlipidemia)   . Coronary artery disease, non-occlusive     cath 2006 pLM 20%, pLAD 40%; mCFX 20%  . PFO (patent foramen ovale)     s/p repair 2006  . Chronic systolic CHF (congestive heart failure)   . ICD (implantable cardiac defibrillator) in place     BIV ICD  . Pacemaker     BIV ICD  . Shortness of breath     ROS:   All systems  reviewed and negative except as noted in the HPI.   Past Surgical History  Procedure Laterality Date  . Rt. shoulder    . Bilateral knee arthroscopy    . Colonoscopy with polypectomy    . Intraoperative transesophageal echocardiogram    . Median sterotomy for mitral valve repair    . Implantation of a dual chamber biventricular icd    . Hip pinning  3/2/012     Family History  Problem Relation Age of Onset  . Heart disease Neg Hx     Rheumatic or valvular heart disease   . Cardiomyopathy Neg Hx      History   Social History  . Marital Status: Married    Spouse Name: N/A    Number of Children: 6  . Years of Education: N/A   Occupational History  . 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 [employer] in his church    Social History Main Topics  . Smoking status: Former Smoker -- 1.00 packs/day for 5 years    Quit date: 05/06/1961  . Smokeless tobacco: Former Neurosurgeon    Quit date: 05/07/1951  . Alcohol Use: No  . Drug Use: No  . Sexual Activity: Not  Currently   Other Topics Concern  . Not on file   Social History Narrative   Lives with his wife in [place].   Has 6 grown children.   Remains quite active.   Blood pressure 130/78, pulse 60, respirations 18  Physical Exam:  Well appearing NAD HEENT: Unremarkable Neck:  No JVD, no thyromegally Lungs:  Clear with no wheezes, rales, or rhonchi. HEART:  Regular rate rhythm, no murmurs, no rubs, no clicks Abd:  soft, positive bowel sounds, no organomegally, no rebound, no guarding Ext:  2 plus pulses, no edema, no cyanosis, no clubbing Skin:  No rashes no nodules Neuro:  CN II through XII intact, motor grossly intact  DEVICE  Normal device function.  See PaceArt for details.   Assess/Plan:

## 2013-03-16 NOTE — Assessment & Plan Note (Signed)
Several weeks ago, the patient had evidence of increased weight and volume overload. He was begun on an ARB drug. Overall he is improved. I've encouraged the patient to reduce his salt intake. He'll continue his diuretic therapy. His fluid status is also improved on interrogation of his ICD.

## 2013-03-24 ENCOUNTER — Ambulatory Visit: Payer: Medicare Other | Admitting: Internal Medicine

## 2013-03-26 NOTE — Progress Notes (Signed)
CRT-D device check in office. Thresholds and sensing consistent with previous device measurements. Lead impedance trends stable over time. Permanent AF + Eliquis. No ventricular arrhythmia episodes recorded. Patient bi-ventricularly pacing 88% of the time. Device programmed with appropriate safety margins. Heart failure diagnostics reviewed and trends are stable for patient. RV output increased to 2.5 V for better safety margin. Estimated longevity >3 years.  Patient will follow up remotely on 06-17-2013, and with GT in 1 year.

## 2013-06-10 DIAGNOSIS — I10 Essential (primary) hypertension: Secondary | ICD-10-CM | POA: Diagnosis not present

## 2013-06-10 DIAGNOSIS — I509 Heart failure, unspecified: Secondary | ICD-10-CM | POA: Diagnosis not present

## 2013-06-10 DIAGNOSIS — N138 Other obstructive and reflux uropathy: Secondary | ICD-10-CM | POA: Diagnosis not present

## 2013-06-10 DIAGNOSIS — E119 Type 2 diabetes mellitus without complications: Secondary | ICD-10-CM | POA: Diagnosis not present

## 2013-06-10 DIAGNOSIS — I428 Other cardiomyopathies: Secondary | ICD-10-CM | POA: Diagnosis not present

## 2013-06-10 DIAGNOSIS — N401 Enlarged prostate with lower urinary tract symptoms: Secondary | ICD-10-CM | POA: Diagnosis not present

## 2013-06-10 DIAGNOSIS — N183 Chronic kidney disease, stage 3 unspecified: Secondary | ICD-10-CM | POA: Diagnosis not present

## 2013-06-10 DIAGNOSIS — J438 Other emphysema: Secondary | ICD-10-CM | POA: Diagnosis not present

## 2013-06-10 DIAGNOSIS — I4891 Unspecified atrial fibrillation: Secondary | ICD-10-CM | POA: Diagnosis not present

## 2013-06-17 ENCOUNTER — Encounter: Payer: Medicare Other | Admitting: *Deleted

## 2013-06-21 ENCOUNTER — Other Ambulatory Visit (HOSPITAL_COMMUNITY): Payer: Self-pay | Admitting: Internal Medicine

## 2013-06-23 ENCOUNTER — Encounter: Payer: Self-pay | Admitting: *Deleted

## 2013-07-13 ENCOUNTER — Other Ambulatory Visit: Payer: Self-pay | Admitting: *Deleted

## 2013-07-13 ENCOUNTER — Other Ambulatory Visit: Payer: Self-pay | Admitting: Internal Medicine

## 2013-07-13 MED ORDER — FUROSEMIDE 40 MG PO TABS: ORAL_TABLET | ORAL | Status: DC

## 2013-07-27 NOTE — Progress Notes (Signed)
This encounter was created in error - please disregard.

## 2013-08-24 DIAGNOSIS — H43819 Vitreous degeneration, unspecified eye: Secondary | ICD-10-CM | POA: Diagnosis not present

## 2013-08-24 DIAGNOSIS — H04129 Dry eye syndrome of unspecified lacrimal gland: Secondary | ICD-10-CM | POA: Diagnosis not present

## 2013-08-24 DIAGNOSIS — Z961 Presence of intraocular lens: Secondary | ICD-10-CM | POA: Diagnosis not present

## 2013-08-30 DIAGNOSIS — I10 Essential (primary) hypertension: Secondary | ICD-10-CM | POA: Diagnosis not present

## 2013-08-30 DIAGNOSIS — Z125 Encounter for screening for malignant neoplasm of prostate: Secondary | ICD-10-CM | POA: Diagnosis not present

## 2013-08-30 DIAGNOSIS — L821 Other seborrheic keratosis: Secondary | ICD-10-CM | POA: Diagnosis not present

## 2013-08-30 DIAGNOSIS — L819 Disorder of pigmentation, unspecified: Secondary | ICD-10-CM | POA: Diagnosis not present

## 2013-08-30 DIAGNOSIS — Z85828 Personal history of other malignant neoplasm of skin: Secondary | ICD-10-CM | POA: Diagnosis not present

## 2013-08-30 DIAGNOSIS — D235 Other benign neoplasm of skin of trunk: Secondary | ICD-10-CM | POA: Diagnosis not present

## 2013-08-30 DIAGNOSIS — E119 Type 2 diabetes mellitus without complications: Secondary | ICD-10-CM | POA: Diagnosis not present

## 2013-09-06 DIAGNOSIS — E119 Type 2 diabetes mellitus without complications: Secondary | ICD-10-CM | POA: Diagnosis not present

## 2013-09-06 DIAGNOSIS — Z125 Encounter for screening for malignant neoplasm of prostate: Secondary | ICD-10-CM | POA: Diagnosis not present

## 2013-09-06 DIAGNOSIS — Z6826 Body mass index (BMI) 26.0-26.9, adult: Secondary | ICD-10-CM | POA: Diagnosis not present

## 2013-09-06 DIAGNOSIS — Z23 Encounter for immunization: Secondary | ICD-10-CM | POA: Diagnosis not present

## 2013-09-06 DIAGNOSIS — I10 Essential (primary) hypertension: Secondary | ICD-10-CM | POA: Diagnosis not present

## 2013-09-06 DIAGNOSIS — Z0000 Encounter for general adult medical examination without abnormal findings: Secondary | ICD-10-CM | POA: Diagnosis not present

## 2013-09-06 DIAGNOSIS — Z1212 Encounter for screening for malignant neoplasm of rectum: Secondary | ICD-10-CM | POA: Diagnosis not present

## 2013-09-06 DIAGNOSIS — N183 Chronic kidney disease, stage 3 unspecified: Secondary | ICD-10-CM | POA: Diagnosis not present

## 2013-09-30 ENCOUNTER — Ambulatory Visit (INDEPENDENT_AMBULATORY_CARE_PROVIDER_SITE_OTHER): Payer: Medicare Other | Admitting: Internal Medicine

## 2013-09-30 ENCOUNTER — Encounter: Payer: Self-pay | Admitting: Internal Medicine

## 2013-09-30 VITALS — BP 124/71 | HR 71 | Ht 67.0 in | Wt 173.2 lb

## 2013-09-30 DIAGNOSIS — I5023 Acute on chronic systolic (congestive) heart failure: Secondary | ICD-10-CM

## 2013-09-30 DIAGNOSIS — I509 Heart failure, unspecified: Secondary | ICD-10-CM

## 2013-09-30 DIAGNOSIS — I4891 Unspecified atrial fibrillation: Secondary | ICD-10-CM | POA: Diagnosis not present

## 2013-09-30 DIAGNOSIS — Z95810 Presence of automatic (implantable) cardiac defibrillator: Secondary | ICD-10-CM

## 2013-09-30 DIAGNOSIS — I428 Other cardiomyopathies: Secondary | ICD-10-CM | POA: Diagnosis not present

## 2013-09-30 LAB — MDC_IDC_ENUM_SESS_TYPE_INCLINIC
Brady Statistic RA Percent Paced: 0.62 %
Brady Statistic RV Percent Paced: 90 %
HighPow Impedance: 43.5938
Implantable Pulse Generator Serial Number: 623851
Lead Channel Impedance Value: 400 Ohm
Lead Channel Impedance Value: 425 Ohm
Lead Channel Impedance Value: 512.5 Ohm
Lead Channel Pacing Threshold Amplitude: 1 V
Lead Channel Pacing Threshold Pulse Width: 0.5 ms
Lead Channel Pacing Threshold Pulse Width: 0.8 ms
Lead Channel Sensing Intrinsic Amplitude: 1.4 mV
Lead Channel Sensing Intrinsic Amplitude: 12 mV
Lead Channel Setting Pacing Amplitude: 2 V
Lead Channel Setting Pacing Pulse Width: 0.5 ms
Lead Channel Setting Sensing Sensitivity: 0.5 mV
MDC IDC MSMT BATTERY REMAINING LONGEVITY: 38.4 mo
MDC IDC MSMT LEADCHNL LV PACING THRESHOLD AMPLITUDE: 1 V
MDC IDC MSMT LEADCHNL LV PACING THRESHOLD PULSEWIDTH: 0.8 ms
MDC IDC MSMT LEADCHNL RV PACING THRESHOLD AMPLITUDE: 0.5 V
MDC IDC MSMT LEADCHNL RV PACING THRESHOLD AMPLITUDE: 0.5 V
MDC IDC MSMT LEADCHNL RV PACING THRESHOLD PULSEWIDTH: 0.5 ms
MDC IDC SESS DTM: 20150528132549
MDC IDC SET LEADCHNL LV PACING AMPLITUDE: 2 V
MDC IDC SET LEADCHNL LV PACING PULSEWIDTH: 0.8 ms
MDC IDC SET LEADCHNL RV PACING AMPLITUDE: 2.5 V
MDC IDC SET ZONE DETECTION INTERVAL: 250 ms
Zone Setting Detection Interval: 300 ms
Zone Setting Detection Interval: 375 ms

## 2013-09-30 NOTE — Patient Instructions (Addendum)
Your physician wants you to follow-up in: 12 months with Dr Knox Saliva will receive a reminder letter in the mail two months in advance. If you don't receive a letter, please call our office to schedule the follow-up appointment.   Remote monitoring is used to monitor your Pacemaker or ICD from home. This monitoring reduces the number of office visits required to check your device to one time per year. It allows [place] to keep an eye on the functioning of your device to ensure it is working properly. You are scheduled for a device check from home on 01/04/14. You may send your transmission at any time that day. If you have a wireless device, the transmission will be sent automatically. After your physician reviews your transmission, you will receive a postcard with your next transmission date.

## 2013-09-30 NOTE — Progress Notes (Signed)
HPI Arthur Armstrong returns today for followup. He is a very pleasant 80 year old man with a history of chronic atrial fibrillation, complete heart block, chronic systolic heart failure, status post biventricular ICD implantation. He has underlying nonischemic cardiomyopathy. His heart failure symptoms have been class II. He denies chest pain or shortness of breath. No dietary indiscretion. No syncope. No Known Allergies   Current Outpatient Prescriptions  Medication Sig Dispense Refill  . apixaban (ELIQUIS) 5 MG TABS tablet Take 5 mg by mouth every 12 (twelve) hours.      . carvedilol (COREG) 6.25 MG tablet TAKE 1 TABLET BY MOUTH TWICE DAILY WITH A MEAL  180 tablet  3  . furosemide (LASIX) 40 MG tablet TAKE 2 TABLETS BY MOUTH TWICE DAILY  360 tablet  0  . niacin (NIASPAN) 500 MG CR tablet TAKE 1 TABLET BY MOUTH DAILY  30 tablet  6  . potassium chloride SA (K-DUR,KLOR-CON) 20 MEQ tablet Take 2 tablets (40 mEq total) by mouth 2 (two) times daily.  60 tablet  2  . Tamsulosin HCl (FLOMAX) 0.4 MG CAPS Take 0.4 mg by mouth at bedtime.        . valsartan (DIOVAN) 80 MG tablet 80 mg 2 (two) times daily.       No current facility-administered medications for this visit.     Past Medical History  Diagnosis Date  . Automatic implantable cardiac defibrillator in situ     BiV pacer  . NICM (nonischemic cardiomyopathy)     nicm ef 30-35%,nonob cad-cath 2006,echo 2/12:ef30-35%.mod lvh,inf hk,mild-mod lae and mild rae,pasp 35  . Systolic CHF   . Unspecified essential hypertension   . Atrial fibrillation   . S/P mitral valve repair     2006  . HTN (hypertension)   . HLD (hyperlipidemia)   . Coronary artery disease, non-occlusive     cath 2006 pLM 20%, pLAD 40%; mCFX 20%  . PFO (patent foramen ovale)     s/p repair 2006  . Chronic systolic CHF (congestive heart failure)   . ICD (implantable cardiac defibrillator) in place     BIV ICD  . Pacemaker     BIV ICD  . Shortness of breath     ROS:   All systems reviewed and negative except as noted in the HPI.   Past Surgical History  Procedure Laterality Date  . Rt. shoulder    . Bilateral knee arthroscopy    . Colonoscopy with polypectomy    . Intraoperative transesophageal echocardiogram    . Median sterotomy for mitral valve repair    . Implantation of a dual chamber biventricular icd    . Hip pinning  3/2/012     Family History  Problem Relation Age of Onset  . Heart disease Neg Hx     Rheumatic or valvular heart disease   . Cardiomyopathy Neg Hx      History   Social History  . Marital Status: Married    Spouse Name: N/A    Number of Children: 6  . Years of Education: N/A   Occupational History  . 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 Smoking status: Former Smoker -- 1.00 packs/day for 5 years    Quit date: 05/06/1961  . Smokeless tobacco: Former Systems developer    Quit date: 05/07/1951  . Alcohol Use: No  . Drug Use: No  . Sexual Activity: Not Currently  Other Topics Concern  . Not on file   Social History Narrative   Lives with his wife in [place].   Has 6 grown children.   Remains quite active.   Blood pressure 130/78, pulse 60, respirations 18  Physical Exam:  Well appearing 80 yo woman, NAD HEENT: Unremarkable Neck:  No JVD, no thyromegally Lungs:  Clear with no wheezes, rales, or rhonchi. HEART:  Regular rate rhythm, no murmurs, no rubs, no clicks Abd:  soft, positive bowel sounds, no organomegally, no rebound, no guarding Ext:  2 plus pulses, no edema, no cyanosis, no clubbing Skin:  No rashes no nodules Neuro:  CN II through XII intact, motor grossly intact  DEVICE  Normal device function.  See PaceArt for details.   Assess/Plan:

## 2013-09-30 NOTE — Assessment & Plan Note (Signed)
He is chronically in atrial fibrillation and his ventricular rate is well controlled. He will continue his current meds.

## 2013-09-30 NOTE — Assessment & Plan Note (Signed)
His [employer]. Jude ICD is working normally. Will recheck in several months.  

## 2013-09-30 NOTE — Assessment & Plan Note (Signed)
His symptoms remain well controlled and class 2. He will continue his current level of activity and maintain a low sodium diet. No change in meds.

## 2013-10-27 DIAGNOSIS — J811 Chronic pulmonary edema: Secondary | ICD-10-CM | POA: Diagnosis not present

## 2013-10-27 DIAGNOSIS — I509 Heart failure, unspecified: Secondary | ICD-10-CM | POA: Diagnosis not present

## 2013-10-27 DIAGNOSIS — Z6827 Body mass index (BMI) 27.0-27.9, adult: Secondary | ICD-10-CM | POA: Diagnosis not present

## 2013-10-27 DIAGNOSIS — R05 Cough: Secondary | ICD-10-CM | POA: Diagnosis not present

## 2013-10-27 DIAGNOSIS — R059 Cough, unspecified: Secondary | ICD-10-CM | POA: Diagnosis not present

## 2013-10-27 DIAGNOSIS — J209 Acute bronchitis, unspecified: Secondary | ICD-10-CM | POA: Diagnosis not present

## 2013-11-14 ENCOUNTER — Other Ambulatory Visit: Payer: Self-pay | Admitting: Internal Medicine

## 2013-11-15 ENCOUNTER — Other Ambulatory Visit: Payer: Self-pay | Admitting: Internal Medicine

## 2013-11-18 DIAGNOSIS — M7020 Olecranon bursitis, unspecified elbow: Secondary | ICD-10-CM | POA: Diagnosis not present

## 2013-11-18 DIAGNOSIS — I10 Essential (primary) hypertension: Secondary | ICD-10-CM | POA: Diagnosis not present

## 2013-11-18 DIAGNOSIS — Z6827 Body mass index (BMI) 27.0-27.9, adult: Secondary | ICD-10-CM | POA: Diagnosis not present

## 2013-11-21 ENCOUNTER — Other Ambulatory Visit: Payer: Self-pay | Admitting: Internal Medicine

## 2013-11-23 ENCOUNTER — Other Ambulatory Visit: Payer: Self-pay

## 2013-11-23 MED ORDER — APIXABAN 5 MG PO TABS: 5.0000 mg | ORAL_TABLET | Freq: Two times a day (BID) | ORAL | Status: DC

## 2013-11-30 DIAGNOSIS — M7020 Olecranon bursitis, unspecified elbow: Secondary | ICD-10-CM | POA: Diagnosis not present

## 2013-11-30 DIAGNOSIS — M25529 Pain in unspecified elbow: Secondary | ICD-10-CM | POA: Diagnosis not present

## 2013-12-06 ENCOUNTER — Other Ambulatory Visit (HOSPITAL_COMMUNITY): Payer: Self-pay | Admitting: Internal Medicine

## 2013-12-07 ENCOUNTER — Other Ambulatory Visit (HOSPITAL_COMMUNITY): Payer: Self-pay | Admitting: Internal Medicine

## 2013-12-23 DIAGNOSIS — M7020 Olecranon bursitis, unspecified elbow: Secondary | ICD-10-CM | POA: Diagnosis not present

## 2014-01-03 ENCOUNTER — Telehealth: Payer: Self-pay | Admitting: Cardiology

## 2014-01-03 ENCOUNTER — Encounter: Payer: Medicare Other | Admitting: *Deleted

## 2014-01-03 NOTE — Telephone Encounter (Signed)
LMOVM reminding pt to send remote transmission.   

## 2014-01-04 ENCOUNTER — Encounter: Payer: Self-pay | Admitting: Cardiology

## 2014-01-20 ENCOUNTER — Ambulatory Visit (INDEPENDENT_AMBULATORY_CARE_PROVIDER_SITE_OTHER): Payer: Medicare Other | Admitting: *Deleted

## 2014-01-20 DIAGNOSIS — I428 Other cardiomyopathies: Secondary | ICD-10-CM | POA: Diagnosis not present

## 2014-01-20 DIAGNOSIS — I5023 Acute on chronic systolic (congestive) heart failure: Secondary | ICD-10-CM

## 2014-01-20 DIAGNOSIS — I509 Heart failure, unspecified: Secondary | ICD-10-CM

## 2014-01-20 LAB — MDC_IDC_ENUM_SESS_TYPE_INCLINIC
Battery Remaining Longevity: 34.8 mo
Brady Statistic RA Percent Paced: 0.61 %
Brady Statistic RV Percent Paced: 91 %
Date Time Interrogation Session: 20150917101350
HIGH POWER IMPEDANCE MEASURED VALUE: 43.2944
Lead Channel Pacing Threshold Amplitude: 0.5 V
Lead Channel Pacing Threshold Amplitude: 0.5 V
Lead Channel Pacing Threshold Amplitude: 1.5 V
Lead Channel Pacing Threshold Pulse Width: 0.5 ms
Lead Channel Pacing Threshold Pulse Width: 0.5 ms
Lead Channel Sensing Intrinsic Amplitude: 1.8 mV
Lead Channel Sensing Intrinsic Amplitude: 12 mV
Lead Channel Setting Pacing Amplitude: 2.5 V
Lead Channel Setting Pacing Pulse Width: 0.5 ms
Lead Channel Setting Pacing Pulse Width: 0.8 ms
Lead Channel Setting Sensing Sensitivity: 0.5 mV
MDC IDC MSMT LEADCHNL LV IMPEDANCE VALUE: 525 Ohm
MDC IDC MSMT LEADCHNL LV PACING THRESHOLD AMPLITUDE: 1.5 V
MDC IDC MSMT LEADCHNL LV PACING THRESHOLD PULSEWIDTH: 0.8 ms
MDC IDC MSMT LEADCHNL LV PACING THRESHOLD PULSEWIDTH: 0.8 ms
MDC IDC MSMT LEADCHNL RA IMPEDANCE VALUE: 400 Ohm
MDC IDC MSMT LEADCHNL RV IMPEDANCE VALUE: 462.5 Ohm
MDC IDC PG SERIAL: 623851
MDC IDC SET LEADCHNL LV PACING AMPLITUDE: 2.5 V
MDC IDC SET LEADCHNL RA PACING AMPLITUDE: 2 V
MDC IDC SET ZONE DETECTION INTERVAL: 300 ms
Zone Setting Detection Interval: 250 ms
Zone Setting Detection Interval: 375 ms

## 2014-01-20 NOTE — Progress Notes (Signed)
CRT-D device check in office. Thresholds and sensing consistent with previous device measurements. Lead impedance trends stable over time. Permanent AF + Eliquis. No ventricular arrhythmia episodes recorded. Patient bi-ventricularly pacing 91% of the time. Device programmed with appropriate safety margins. Heart failure diagnostics reviewed and trends appear to have been abn x 17 days---just recently stabilized. No changes made this session. Estimated longevity 3 years. Patient will follow up via [facility] on 12-21 and with GT in 09-2014. Cell adaptor given.

## 2014-01-25 ENCOUNTER — Encounter: Payer: Self-pay | Admitting: Internal Medicine

## 2014-02-15 DIAGNOSIS — H6123 Impacted cerumen, bilateral: Secondary | ICD-10-CM | POA: Diagnosis not present

## 2014-02-15 DIAGNOSIS — Z6827 Body mass index (BMI) 27.0-27.9, adult: Secondary | ICD-10-CM | POA: Diagnosis not present

## 2014-02-15 DIAGNOSIS — Z23 Encounter for immunization: Secondary | ICD-10-CM | POA: Diagnosis not present

## 2014-02-15 DIAGNOSIS — H9191 Unspecified hearing loss, right ear: Secondary | ICD-10-CM | POA: Diagnosis not present

## 2014-02-21 DIAGNOSIS — H6121 Impacted cerumen, right ear: Secondary | ICD-10-CM | POA: Diagnosis not present

## 2014-02-21 DIAGNOSIS — H9191 Unspecified hearing loss, right ear: Secondary | ICD-10-CM | POA: Diagnosis not present

## 2014-02-21 DIAGNOSIS — Z6827 Body mass index (BMI) 27.0-27.9, adult: Secondary | ICD-10-CM | POA: Diagnosis not present

## 2014-02-27 ENCOUNTER — Inpatient Hospital Stay (HOSPITAL_COMMUNITY)
Admission: EM | Admit: 2014-02-27 | Discharge: 2014-02-28 | DRG: 312 | Disposition: A | Discharge status: Home / Self Care | Payer: Medicare Other | Attending: Internal Medicine | Admitting: Internal Medicine

## 2014-02-27 ENCOUNTER — Emergency Department (HOSPITAL_COMMUNITY): Payer: Medicare Other

## 2014-02-27 ENCOUNTER — Encounter (HOSPITAL_COMMUNITY): Payer: Self-pay | Admitting: Emergency Medicine

## 2014-02-27 DIAGNOSIS — I5022 Chronic systolic (congestive) heart failure: Secondary | ICD-10-CM | POA: Diagnosis present

## 2014-02-27 DIAGNOSIS — R55 Syncope and collapse: Secondary | ICD-10-CM | POA: Diagnosis not present

## 2014-02-27 DIAGNOSIS — I2510 Atherosclerotic heart disease of native coronary artery without angina pectoris: Secondary | ICD-10-CM | POA: Diagnosis present

## 2014-02-27 DIAGNOSIS — Z87891 Personal history of nicotine dependence: Secondary | ICD-10-CM

## 2014-02-27 DIAGNOSIS — Z7901 Long term (current) use of anticoagulants: Secondary | ICD-10-CM

## 2014-02-27 DIAGNOSIS — E785 Hyperlipidemia, unspecified: Secondary | ICD-10-CM | POA: Diagnosis present

## 2014-02-27 DIAGNOSIS — Z95810 Presence of automatic (implantable) cardiac defibrillator: Secondary | ICD-10-CM | POA: Diagnosis not present

## 2014-02-27 DIAGNOSIS — I428 Other cardiomyopathies: Secondary | ICD-10-CM | POA: Diagnosis present

## 2014-02-27 DIAGNOSIS — I951 Orthostatic hypotension: Secondary | ICD-10-CM | POA: Diagnosis not present

## 2014-02-27 DIAGNOSIS — R000 Tachycardia, unspecified: Secondary | ICD-10-CM | POA: Diagnosis not present

## 2014-02-27 DIAGNOSIS — I4891 Unspecified atrial fibrillation: Secondary | ICD-10-CM | POA: Diagnosis present

## 2014-02-27 DIAGNOSIS — I10 Essential (primary) hypertension: Secondary | ICD-10-CM | POA: Diagnosis present

## 2014-02-27 DIAGNOSIS — I509 Heart failure, unspecified: Secondary | ICD-10-CM | POA: Diagnosis not present

## 2014-02-27 DIAGNOSIS — I499 Cardiac arrhythmia, unspecified: Secondary | ICD-10-CM | POA: Diagnosis not present

## 2014-02-27 DIAGNOSIS — R42 Dizziness and giddiness: Secondary | ICD-10-CM | POA: Diagnosis not present

## 2014-02-27 DIAGNOSIS — R4781 Slurred speech: Secondary | ICD-10-CM | POA: Diagnosis not present

## 2014-02-27 DIAGNOSIS — N400 Enlarged prostate without lower urinary tract symptoms: Secondary | ICD-10-CM | POA: Diagnosis present

## 2014-02-27 HISTORY — DX: Ventral hernia without obstruction or gangrene: K43.9

## 2014-02-27 LAB — CBC WITH DIFFERENTIAL/PLATELET
Basophils Absolute: 0 10*3/uL (ref 0.0–0.1)
Basophils Relative: 0 % (ref 0–1)
EOS ABS: 0.1 10*3/uL (ref 0.0–0.7)
Eosinophils Relative: 2 % (ref 0–5)
HCT: 39.6 % (ref 39.0–52.0)
HEMOGLOBIN: 13 g/dL (ref 13.0–17.0)
LYMPHS ABS: 1.1 10*3/uL (ref 0.7–4.0)
LYMPHS PCT: 20 % (ref 12–46)
MCH: 31.3 pg (ref 26.0–34.0)
MCHC: 32.8 g/dL (ref 30.0–36.0)
MCV: 95.4 fL (ref 78.0–100.0)
MONOS PCT: 4 % (ref 3–12)
Monocytes Absolute: 0.2 10*3/uL (ref 0.1–1.0)
NEUTROS ABS: 4.1 10*3/uL (ref 1.7–7.7)
NEUTROS PCT: 74 % (ref 43–77)
PLATELETS: 134 10*3/uL — AB (ref 150–400)
RBC: 4.15 MIL/uL — AB (ref 4.22–5.81)
RDW: 13.9 % (ref 11.5–15.5)
WBC: 5.6 10*3/uL (ref 4.0–10.5)

## 2014-02-27 LAB — BASIC METABOLIC PANEL
Anion gap: 11 (ref 5–15)
BUN: 47 mg/dL — AB (ref 6–23)
CHLORIDE: 104 meq/L (ref 96–112)
CO2: 28 meq/L (ref 19–32)
Calcium: 9.6 mg/dL (ref 8.4–10.5)
Creatinine, Ser: 1.82 mg/dL — ABNORMAL HIGH (ref 0.50–1.35)
GFR calc Af Amer: 37 mL/min — ABNORMAL LOW (ref >90)
GFR, EST NON AFRICAN AMERICAN: 32 mL/min — AB (ref >90)
Glucose, Bld: 164 mg/dL — ABNORMAL HIGH (ref 70–99)
POTASSIUM: 5.2 meq/L (ref 3.7–5.3)
SODIUM: 143 meq/L (ref 137–147)

## 2014-02-27 LAB — URINALYSIS, ROUTINE W REFLEX MICROSCOPIC
BILIRUBIN URINE: NEGATIVE
GLUCOSE, UA: NEGATIVE mg/dL
Hgb urine dipstick: NEGATIVE
Ketones, ur: NEGATIVE mg/dL
Leukocytes, UA: NEGATIVE
NITRITE: NEGATIVE
PH: 5.5 (ref 5.0–8.0)
Protein, ur: NEGATIVE mg/dL
Specific Gravity, Urine: 1.009 (ref 1.005–1.030)
Urobilinogen, UA: 0.2 mg/dL (ref 0.0–1.0)

## 2014-02-27 LAB — CBG MONITORING, ED: GLUCOSE-CAPILLARY: 166 mg/dL — AB (ref 70–99)

## 2014-02-27 LAB — I-STAT TROPONIN, ED: Troponin i, poc: 0.02 ng/mL (ref 0.00–0.08)

## 2014-02-27 LAB — TSH: TSH: 1.77 u[IU]/mL (ref 0.350–4.500)

## 2014-02-27 LAB — PHOSPHORUS: PHOSPHORUS: 2.8 mg/dL (ref 2.3–4.6)

## 2014-02-27 LAB — MAGNESIUM: Magnesium: 2.4 mg/dL (ref 1.5–2.5)

## 2014-02-27 MED ORDER — CARVEDILOL 6.25 MG PO TABS
6.2500 mg | ORAL_TABLET | Freq: Two times a day (BID) | ORAL | Status: DC
  Administered 2014-02-27 – 2014-02-28 (×2): 6.25 mg via ORAL
  Filled 2014-02-27 (×5): qty 1

## 2014-02-27 MED ORDER — FUROSEMIDE 40 MG PO TABS
40.0000 mg | ORAL_TABLET | Freq: Two times a day (BID) | ORAL | Status: DC
  Administered 2014-02-27 – 2014-02-28 (×2): 40 mg via ORAL
  Filled 2014-02-27 (×4): qty 1

## 2014-02-27 MED ORDER — IRBESARTAN 75 MG PO TABS
75.0000 mg | ORAL_TABLET | Freq: Every day | ORAL | Status: DC
  Administered 2014-02-27 – 2014-02-28 (×2): 75 mg via ORAL
  Filled 2014-02-27 (×2): qty 1

## 2014-02-27 MED ORDER — ACETAMINOPHEN 325 MG PO TABS: 650.0000 mg | ORAL_TABLET | Freq: Four times a day (QID) | ORAL | Status: DC | PRN

## 2014-02-27 MED ORDER — APIXABAN 5 MG PO TABS
5.0000 mg | ORAL_TABLET | Freq: Two times a day (BID) | ORAL | Status: DC
  Administered 2014-02-27 – 2014-02-28 (×2): 5 mg via ORAL
  Filled 2014-02-27 (×3): qty 1

## 2014-02-27 MED ORDER — SODIUM CHLORIDE 0.9 % IV SOLN
INTRAVENOUS | Status: DC
  Administered 2014-02-27: 17:00:00 via INTRAVENOUS

## 2014-02-27 MED ORDER — NIACIN ER 500 MG PO CPCR
500.0000 mg | ORAL_CAPSULE | Freq: Every day | ORAL | Status: DC
  Administered 2014-02-27 – 2014-02-28 (×2): 500 mg via ORAL
  Filled 2014-02-27 (×2): qty 1

## 2014-02-27 MED ORDER — ACETAMINOPHEN 650 MG RE SUPP: 650.0000 mg | Freq: Four times a day (QID) | RECTAL | Status: DC | PRN

## 2014-02-27 MED ORDER — POTASSIUM CHLORIDE CRYS ER 20 MEQ PO TBCR
40.0000 meq | EXTENDED_RELEASE_TABLET | Freq: Every day | ORAL | Status: DC
  Administered 2014-02-28: 40 meq via ORAL
  Filled 2014-02-27: qty 2

## 2014-02-27 MED ORDER — SODIUM CHLORIDE 0.9 % IV BOLUS (SEPSIS)
500.0000 mL | Freq: Once | INTRAVENOUS | Status: AC
  Administered 2014-02-27: 500 mL via INTRAVENOUS

## 2014-02-27 NOTE — ED Notes (Signed)
Patient asked for urine sample. He states that he is unable to give sample at this time 

## 2014-02-27 NOTE — ED Notes (Signed)
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 States he had a burning sensation in his legs. Denies pain no headache

## 2014-02-27 NOTE — ED Notes (Signed)
Patient away at xray. Daughter-in-law at the bedside.

## 2014-02-27 NOTE — ED Notes (Signed)
Patient returned from X-ray 

## 2014-02-27 NOTE — ED Notes (Signed)
Patient transported to X-ray 

## 2014-02-27 NOTE — ED Provider Notes (Signed)
CSN: [id number]     Arrival date & time 02/27/14  1140 History   First MD Initiated Contact with Patient 02/27/14 1142     Chief Complaint  Patient presents with  . Near Syncope     (Consider location/radiation/quality/duration/timing/severity/associated sxs/prior Treatment) The history is provided by the patient and medical records.   This is an 80 y.o. M with PMH significant for HTN, HLP, CAD, AFIB, pacemaker and defib placement, presenting to the ED for near syncopal event.  Patient is a Theme park manager, was giving sermon and began to feel like he was going to pass out with a burning sensation in his legs so he went to sit down.  Did not fall, no head injury or LOC.  On EMS arrival, patient was orthostatic so given 500cc bolus with improvement.  Patient denies any current chest pain, SOB, palpitations, abdominal pain, nausea, vomiting, diarrhea.  States he has been feeling well lately, no fever, chills, or recent illness.  He does have hx of syncope in the past.  Patient has [place] ICD-- denies firing.  Denies recent changes in medications.  States has been eating and drinking normally.  Last 2D echo with estimated 30% EF.  Cardiologist-- Dr. Lovena Le.  Past Medical History  Diagnosis Date  . Automatic implantable cardiac defibrillator in situ     BiV pacer  . NICM (nonischemic cardiomyopathy)     nicm ef 30-35%,nonob cad-cath 2006,echo 2/12:ef30-35%.mod lvh,inf hk,mild-mod lae and mild rae,pasp 35  . Systolic CHF   . Unspecified essential hypertension   . Atrial fibrillation   . S/P mitral valve repair     2006  . HTN (hypertension)   . HLD (hyperlipidemia)   . Coronary artery disease, non-occlusive     cath 2006 pLM 20%, pLAD 40%; mCFX 20%  . PFO (patent foramen ovale)     s/p repair 2006  . Chronic systolic CHF (congestive heart failure)   . ICD (implantable cardiac defibrillator) in place     BIV ICD  . Pacemaker     BIV ICD  . Shortness of breath   . Hernia, ventral    Past  Surgical History  Procedure Laterality Date  . Rt. shoulder    . Bilateral knee arthroscopy    . Colonoscopy with polypectomy    . Intraoperative transesophageal echocardiogram    . Median sterotomy for mitral valve repair    . Implantation of a dual chamber biventricular icd    . Hip pinning  3/2/012   Family History  Problem Relation Age of Onset  . Heart disease Neg Hx     Rheumatic or valvular heart disease   . Cardiomyopathy Neg Hx    History  Substance Use Topics  . Smoking status: Former Smoker -- 1.00 packs/day for 5 years    Quit date: 05/06/1961  . Smokeless tobacco: Former Systems developer    Quit date: 05/07/1951  . Alcohol Use: No    Review of Systems  Neurological: Positive for syncope (near syncope).  All other systems reviewed and are negative.     Allergies  Review of patient's allergies indicates no known allergies.  Home Medications   Prior to Admission medications   Medication Sig Start Date End Date Taking? Authorizing Provider  apixaban (ELIQUIS) 5 MG TABS tablet Take 1 tablet (5 mg total) by mouth every 12 (twelve) hours. 11/23/13   Evans Lance, MD  carvedilol (COREG) 6.25 MG tablet TAKE 1 TABLET BY MOUTH TWICE DAILY WITH A MEAL  Evans Lance, MD  furosemide (LASIX) 40 MG tablet TAKE 2 TABLETS BY MOUTH TWICE DAILY 07/13/13   Evans Lance, MD  niacin (NIASPAN) 500 MG CR tablet TAKE 1 TABLET BY MOUTH DAILY    Evans Lance, MD  potassium chloride SA (K-DUR,KLOR-CON) 20 MEQ tablet TAKE 2 TABLETS BY MOUTH TWICE DAILY    Evans Lance, MD  Tamsulosin HCl (FLOMAX) 0.4 MG CAPS Take 0.4 mg by mouth at bedtime.      Historical Provider, MD  valsartan (DIOVAN) 80 MG tablet 80 mg 2 (two) times daily. 03/10/13   Historical Provider, MD   BP 104/65  Pulse 70  Temp(Src) 97.6 F (36.4 C) (Oral)  Resp 18  Ht 5\' 7"  (1.702 m)  Wt 168 lb (76.204 kg)  BMI 26.31 kg/m2  SpO2 94%  Physical Exam  Nursing note and vitals reviewed. Constitutional: He is oriented to  person, place, and time. He appears well-developed and well-nourished. No distress.  elderly  HENT:  Head: Normocephalic and atraumatic.  Mouth/Throat: Oropharynx is clear and moist.  Eyes: Conjunctivae and EOM are normal. Pupils are equal, round, and reactive to light.  Neck: Normal range of motion. Neck supple.  Cardiovascular: Normal rate, regular rhythm and normal heart sounds.   Pulmonary/Chest: Effort normal and breath sounds normal. No respiratory distress. He has no wheezes.  Abdominal: Soft. Bowel sounds are normal. There is no tenderness. There is no guarding.  Musculoskeletal: Normal range of motion.  Neurological: He is alert and oriented to person, place, and time.  AAOx3, answering questions appropriately; equal strength UE and LE bilaterally; CN grossly intact; moves all extremities appropriately without ataxia; no focal neuro deficits or facial asymmetry appreciated  Skin: Skin is warm and dry. He is not diaphoretic.  Psychiatric: He has a normal mood and affect.    ED Course  Procedures (including critical care time) Labs Review Labs Reviewed  CBC WITH DIFFERENTIAL - Abnormal; Notable for the following:    RBC 4.15 (*)    Platelets 134 (*)    All other components within normal limits  BASIC METABOLIC PANEL - Abnormal; Notable for the following:    Glucose, Bld 164 (*)    BUN 47 (*)    Creatinine, Ser 1.82 (*)    GFR calc non Af Amer 32 (*)    GFR calc Af Amer 37 (*)    All other components within normal limits  CBG MONITORING, ED - Abnormal; Notable for the following:    Glucose-Capillary 166 (*)    All other components within normal limits  URINALYSIS, ROUTINE W REFLEX MICROSCOPIC  I-STAT [place], ED    Imaging Review Dg Chest 2 View  02/27/2014   CLINICAL DATA:  Dizziness. Near syncope this morning. History of CHF.  EXAM: CHEST  2 VIEW  COMPARISON:  03/31/2012.  FINDINGS: There are changes from previous cardiac surgery and mitral valve replacement,  stable. Cardiac silhouette is mildly enlarged. No mediastinal or hilar masses. Left anterior chest wall defibrillator cardioverter is unchanged, with leads well-positioned.  There is mild reticular scarring in the lung bases, stable. No lung consolidation or edema. No pleural effusion or pneumothorax.  Bony thorax is demineralized but grossly intact.  IMPRESSION: No acute cardiopulmonary disease.   Electronically Signed   By: Lajean Manes M.D.   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 There was no direct fall or head injury. Patient was noted to be orthostatic in the field, correction with fluid bolus.  On arrival to ED, patient has no current complaints. Neuro exam non-focal. EKG A. fib, patient has history of same.  Will obtain lab work, UA, and chest x-ray. Patient has [employer] ICD-- denies firing, will interrogate.  Lab work as above-- SrCr 1.83 (up from 1.3 on last value from Jan 2014), no recent numbers for comparison so unsure if normal progression or AKI.  Remainder of lab work is reassuring.  CXR clear.  U/a non-infectious.  Orthostasis again noted in the ED, given additional 500cc of fluid.  Possibly dehydration playing a role given his large doses of lasix.  Will hydrate judiciously given his low EF.  Case discussed with GMA, Dr. Joylene Draft, who will evaluate patient in the ED and admit.  Larene Pickett, PA-C 02/27/14 [phone]

## 2014-02-27 NOTE — ED Notes (Signed)
St. Jude's Report given to PA.

## 2014-02-27 NOTE — ED Notes (Signed)
Attempted report 

## 2014-02-27 NOTE — H&P (Addendum)
Arthur Armstrong is an 80 y.o. male.   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  HE felt a burning sensation in his legs. Then he felt weak.  Speech  Was slurred. He was wobbly.  Then he almost fell .  His son Arthur Jr. Then caught him.  He was not responsive for a bout a minute or less.  Then he  Was lying down.  He doesn't remember falling.  He remembers waking up. No seizure activity.  He opened eyes and said he was okay. EMS came and brought him in.  HE is found to be persistently orthostatic in the ER despite some IV hydration. He denies sob or cp.  No edema. Moving bowel and bladder okay.  Appetite and oral intake has been nL lately. Denies any fever illness lately. He will be admitted.  Past Medical History  Diagnosis Date  . Automatic implantable cardiac defibrillator in situ     BiV pacer  . NICM (nonischemic cardiomyopathy)     nicm ef 30-35%,nonob cad-cath 2006,echo 2/12:ef30-35%.mod lvh,inf hk,mild-mod lae and mild rae,pasp 35  . Systolic CHF   . Unspecified essential hypertension   . Atrial fibrillation   . S/P mitral valve repair     20"gYrXNvPVT$ 06  . HTN (hypertension)   . HLD (hyperlipidemia)   . Coronary artery disease, non-occlusive     cath 2006 pLM 20%, pLAD 40%; mCFX 20%  . PFO (patent foramen ovale)     s/p repair 2006  . Chronic systolic CHF (congestive heart failure)   . ICD (implantable cardiac defibrillator) in place     BIV ICD  . Pacemaker     BIV ICD  . Shortness of breath   . Hernia, ventral     Past Surgical History  Procedure Laterality Date  . Rt. shoulder    . Bilateral knee arthroscopy    . Colonoscopy with polypectomy    . Intraoperative transesophageal echocardiogram    . Median sterotomy for mitral valve repair    . Implantation of a dual chamber biventricular icd    . Hip pinning  3/2/012    Family History  Problem Relation Age of Onset  . Heart disease Neg Hx     Rheumatic or valvular heart  disease   . Cardiomyopathy Neg Hx    Social History:  reports that he quit smoking about 52 years ago. He quit smokeless tobacco use about 62 years ago. He reports that he does not drink alcohol or use illicit drugs.  Allergies: No Known Allergies   Home meds: See med rec. Reviewed with patient.  Results for orders placed during the hospital encounter of 02/27/14 (from the past 48 hour(s))  CBC WITH DIFFERENTIAL     Status: Abnormal   Collection Time    02/27/14 12:07 PM      Result Value Ref Range   WBC 5.6  4.0 - 10.5 K/uL   RBC 4.15 (*) 4.22 - 5.81 MIL/uL   Hemoglobin 13.0  13.0 - 17.0 g/dL   HCT 39.6  39.0 - 52.0 %   MCV 95.4  78.0 - 100.0 fL   MCH 31.3  26.0 - 34.0 pg   MCHC 32.8  30.0 - 36.0 g/dL   RDW 13.9  11.5 - 15.5 %   Platelets 134 (*) 150 - 400 K/uL   Neutrophils Relative % 74  43 - 77 %   Neutro Abs 4.1  1.7 -  7.7 K/uL   Lymphocytes Relative 20  12 - 46 %   Lymphs Abs 1.1  0.7 - 4.0 K/uL   Monocytes Relative 4  3 - 12 %   Monocytes Absolute 0.2  0.1 - 1.0 K/uL   Eosinophils Relative 2  0 - 5 %   Eosinophils Absolute 0.1  0.0 - 0.7 K/uL   Basophils Relative 0  0 - 1 %   Basophils Absolute 0.0  0.0 - 0.1 K/uL  BASIC METABOLIC PANEL     Status: Abnormal   Collection Time    02/27/14 12:07 PM      Result Value Ref Range   Sodium 143  137 - 147 mEq/L   Potassium 5.2  3.7 - 5.3 mEq/L   Chloride 104  96 - 112 mEq/L   CO2 28  19 - 32 mEq/L   Glucose, Bld 164 (*) 70 - 99 mg/dL   BUN 47 (*) 6 - 23 mg/dL   Creatinine, Ser 1.82 (*) 0.50 - 1.35 mg/dL   Calcium 9.6  8.4 - 10.5 mg/dL   GFR calc non Af Amer 32 (*) >90 mL/min   GFR calc Af Amer 37 (*) >90 mL/min   Comment: (NOTE)     The eGFR has been calculated using the CKD EPI equation.     This calculation has not been validated in all clinical situations.     eGFR's persistently <90 mL/min signify possible Chronic Kidney     Disease.   Anion gap 11  5 - 15  CBG MONITORING, ED     Status: Abnormal   Collection  Time    02/27/14 12:14 PM      Result Value Ref Range   Glucose-Capillary 166 (*) 70 - 99 mg/dL   Comment 1 Notify RN     Comment 2 Call MD NNP PA CNM    I-STAT [place], ED     Status: None   Collection Time    02/27/14 12:31 PM      Result Value Ref Range   Troponin i, poc 0.02  0.00 - 0.08 ng/mL   Comment 3            Comment: Due to the release kinetics of cTnI,     a negative result within the first hours     of the onset of symptoms does not rule out     myocardial infarction with certainty.     If myocardial infarction is still suspected,     repeat the test at appropriate intervals.  URINALYSIS, ROUTINE W REFLEX MICROSCOPIC     Status: None   Collection Time    02/27/14  1:43 PM      Result Value Ref Range   Color, Urine YELLOW  YELLOW   APPearance CLEAR  CLEAR   Specific Gravity, Urine 1.009  1.005 - 1.030   pH 5.5  5.0 - 8.0   Glucose, UA NEGATIVE  NEGATIVE mg/dL   Hgb urine dipstick NEGATIVE  NEGATIVE   Bilirubin Urine NEGATIVE  NEGATIVE   Ketones, ur NEGATIVE  NEGATIVE mg/dL   Protein, ur NEGATIVE  NEGATIVE mg/dL   Urobilinogen, UA 0.2  0.0 - 1.0 mg/dL   Nitrite NEGATIVE  NEGATIVE   Leukocytes, UA NEGATIVE  NEGATIVE   Comment: MICROSCOPIC NOT DONE ON URINES WITH NEGATIVE PROTEIN, BLOOD, LEUKOCYTES, NITRITE, OR GLUCOSE <1000 mg/dL.   Dg Chest 2 View  02/27/2014   CLINICAL DATA:  Dizziness. Near syncope this morning. History of  CHF.  EXAM: CHEST  2 VIEW  COMPARISON:  03/31/2012.  FINDINGS: There are changes from previous cardiac surgery and mitral valve replacement, stable. Cardiac silhouette is mildly enlarged. No mediastinal or hilar masses. Left anterior chest wall defibrillator cardioverter is unchanged, with leads well-positioned.  There is mild reticular scarring in the lung bases, stable. No lung consolidation or edema. No pleural effusion or pneumothorax.  Bony thorax is demineralized but grossly intact.  IMPRESSION: No acute cardiopulmonary disease.    Electronically Signed   By: Lajean Manes M.D.   On: 02/27/2014 12:55    ROS:as per hpi. Had wax removed from ear at our office a week ago  Blood pressure 99/58, pulse 63, temperature 97.6 F (36.4 C), temperature source Oral, resp. rate 24, height $RemoveBe'5\' 7"'JbUBWaAtg$  (1.702 m), weight 76.204 kg (168 lb), SpO2 92.00%.  age approp or appears younger than stated age. no acute distress. no pallor or icterus.  alert and oriented times 4.  lungs cta bilaterally except very slight insp. crackle noted at both bases.  heart is rrr no sig m/r/g. abd soft, nt, nd, no mass or hsm.  no edema. moe times 4.  Assessment/Plan 80 yo male with significant cardiac history presenting with syncope and orthostasis.  Hopefully this is just a sign that his diuretics need to be slightly reduced (but will have to watch for chf sxs if we do this).  We will admit to tele bed.  Very  Slight iv fluids will be given.  Lasix dose will be reduced. Defer cardiology consult to pcp.  Continue other home medications.  I will reduce from 80 mg bid diovan to once daily avapro 75 mg  As we cannot give diovan easily in the hospital. I will stop flomax (and this should probably be discontinued given this event but we will have to see how his urine flow goes).    Jerlyn Ly, MD 02/27/2014, 2:51 PM

## 2014-02-27 NOTE — Progress Notes (Signed)
Pt arrived to the unit, daughter at the bedside. Oriented to the room. VSS

## 2014-02-28 LAB — CBC WITH DIFFERENTIAL/PLATELET
Basophils Absolute: 0 10*3/uL (ref 0.0–0.1)
Basophils Relative: 0 % (ref 0–1)
EOS PCT: 3 % (ref 0–5)
Eosinophils Absolute: 0.2 10*3/uL (ref 0.0–0.7)
HCT: 36.7 % — ABNORMAL LOW (ref 39.0–52.0)
HEMOGLOBIN: 12.1 g/dL — AB (ref 13.0–17.0)
Lymphocytes Relative: 26 % (ref 12–46)
Lymphs Abs: 1.4 10*3/uL (ref 0.7–4.0)
MCH: 31.4 pg (ref 26.0–34.0)
MCHC: 33 g/dL (ref 30.0–36.0)
MCV: 95.3 fL (ref 78.0–100.0)
MONOS PCT: 8 % (ref 3–12)
Monocytes Absolute: 0.4 10*3/uL (ref 0.1–1.0)
Neutro Abs: 3.4 10*3/uL (ref 1.7–7.7)
Neutrophils Relative %: 63 % (ref 43–77)
Platelets: 124 10*3/uL — ABNORMAL LOW (ref 150–400)
RBC: 3.85 MIL/uL — ABNORMAL LOW (ref 4.22–5.81)
RDW: 14 % (ref 11.5–15.5)
WBC: 5.4 10*3/uL (ref 4.0–10.5)

## 2014-02-28 LAB — COMPREHENSIVE METABOLIC PANEL
ALBUMIN: 3.3 g/dL — AB (ref 3.5–5.2)
ALK PHOS: 85 U/L (ref 39–117)
ALT: 8 U/L (ref 0–53)
AST: 12 U/L (ref 0–37)
Anion gap: 12 (ref 5–15)
BUN: 40 mg/dL — ABNORMAL HIGH (ref 6–23)
CO2: 24 mEq/L (ref 19–32)
Calcium: 9.2 mg/dL (ref 8.4–10.5)
Chloride: 104 mEq/L (ref 96–112)
Creatinine, Ser: 1.64 mg/dL — ABNORMAL HIGH (ref 0.50–1.35)
GFR calc Af Amer: 42 mL/min — ABNORMAL LOW (ref >90)
GFR calc non Af Amer: 37 mL/min — ABNORMAL LOW (ref >90)
GLUCOSE: 93 mg/dL (ref 70–99)
Potassium: 4.3 mEq/L (ref 3.7–5.3)
SODIUM: 140 meq/L (ref 137–147)
TOTAL PROTEIN: 6.7 g/dL (ref 6.0–8.3)
Total Bilirubin: 0.5 mg/dL (ref 0.3–1.2)

## 2014-02-28 MED ORDER — FUROSEMIDE 40 MG PO TABS: 40.0000 mg | ORAL_TABLET | Freq: Two times a day (BID) | ORAL | Status: DC

## 2014-02-28 NOTE — Progress Notes (Signed)
Subjective: No issues overnight, but has not had orthostatics yet, breakfast just brought in.  Objective: Vital signs in last 24 hours: Temp:  [97.1 F (36.2 C)-98.2 F (36.8 C)] 97.7 F (36.5 C) (10/26 0617) Pulse Rate:  [37-125] 76 (10/26 0617) Resp:  [14-24] 18 (10/26 0617) BP: (99-121)/(54-73) 111/64 mmHg (10/26 0617) SpO2:  [92 %-98 %] 95 % (10/26 0617) Weight:  [76.204 kg (168 lb)-77.8 kg (171 lb 8.3 oz)] 76.9 kg (169 lb 8.5 oz) (10/26 0617) Weight change:  Last BM Date: 02/27/14  Intake/Output from previous day: 10/25 0701 - 10/26 0700 In: 740 [P.O.:240; I.V.:500] Out: 800 [Urine:800] Intake/Output this shift:    General appearance: alert, cooperative and appears stated age Neck: no adenopathy, no carotid bruit, no JVD, supple, symmetrical, trachea midline and thyroid not enlarged, symmetric, no tenderness/mass/nodules Resp: clear to auscultation bilaterally Cardio: regular rate and rhythm, S1, S2 normal, no murmur, click, rub or gallop GI: soft, non-tender; bowel sounds normal; no masses,  no organomegaly Extremities: extremities normal, atraumatic, no cyanosis or edema Neurologic: Grossly normal  Lab Results:  Recent Labs  02/27/14 1207 02/28/14 0339  WBC 5.6 5.4  HGB 13.0 12.1*  HCT 39.6 36.7*  PLT 134* 124*   BMET  Recent Labs  02/27/14 1207 02/28/14 0339  NA 143 140  K 5.2 4.3  CL 104 104  CO2 28 24  GLUCOSE 164* 93  BUN 47* 40*  CREATININE 1.82* 1.64*  CALCIUM 9.6 9.2    Studies/Results: Dg Chest 2 View  02/27/2014   CLINICAL DATA:  Dizziness. Near syncope this morning. History of CHF.  EXAM: CHEST  2 VIEW  COMPARISON:  03/31/2012.  FINDINGS: There are changes from previous cardiac surgery and mitral valve replacement, stable. Cardiac silhouette is mildly enlarged. No mediastinal or hilar masses. Left anterior chest wall defibrillator cardioverter is unchanged, with leads well-positioned.  There is mild reticular scarring in the lung bases,  stable. No lung consolidation or edema. No pleural effusion or pneumothorax.  Bony thorax is demineralized but grossly intact.  IMPRESSION: No acute cardiopulmonary disease.   Electronically Signed   By: Lajean Manes M.D.   On: 02/27/2014 12:55    Medications:  I have reviewed the patient's current medications. Scheduled: . apixaban  5 mg Oral Q12H  . carvedilol  6.25 mg Oral BID WC  . furosemide  40 mg Oral BID  . irbesartan  75 mg Oral Daily  . niacin  500 mg Oral Daily  . potassium chloride SA  40 mEq Oral Daily   Continuous: . sodium chloride 20 mL/hr at 02/27/14 1645   TJQ:[id number], acetaminophen  Assessment/Plan: Syncope, likely volume related.  He does take Flomax at night, but has had issues with near obstruction and has been on this med for some time.  He is clinically dry as well as based on his renal indices.  Diuretics reduced.  If he can ambulate safely today and orthostasis resolved, likely home at lunchtime, if not, will observe another day.  PT to work with him and nursing ordered for orthostatics this AM. Cardiomyopathy, nonischemic:  Ruled out MI, see notation about fluid balance. CHF as above HTN:  Now orthostatic, see above ICD:  No discharge of device and no evidence of tachyarrhythmia on telemetry thus far.   LOS: 1 day   Vernor Monnig W 02/28/2014, 8:07 AM

## 2014-02-28 NOTE — Evaluation (Signed)
Physical Therapy Evaluation Patient Details Name: Arthur Armstrong MRN: [id number] DOB: Jul 04, 1926 Today's Date: 02/28/2014   History of Present Illness  Pt adm with syncope.  Clinical Impression  Pt doing well with mobility and no further PT needed.  Ready for dc from PT standpoint.      Follow Up Recommendations No PT follow up    Equipment Recommendations  None recommended by PT    Recommendations for Other Services       Precautions / Restrictions Precautions Precautions: None      Mobility  Bed Mobility Overal bed mobility: Independent                Transfers Overall transfer level: Independent                  Ambulation/Gait Ambulation/Gait assistance: Modified independent (Device/Increase time) Ambulation Distance (Feet): 350 Feet Assistive device: Straight cane Gait Pattern/deviations: WFL(Within Functional Limits)   Gait velocity interpretation: at or above normal speed for age/gender    Stairs            Wheelchair Mobility    Modified Rankin (Stroke Patients Only)       Balance Overall balance assessment: No apparent balance deficits (not formally assessed)                                           Pertinent Vitals/Pain Pain Assessment: No/denies pain    Home Living Family/patient expects to be discharged to:: Private residence Living Arrangements: Alone   Type of Home: House Home Access: Level entry     Home Layout: Two level;Bed/bath upstairs Home Equipment: Cane - single point      Prior Function Level of Independence: Independent with assistive device(s)         Comments: Uses cane     Hand Dominance        Extremity/Trunk Assessment   Upper Extremity Assessment: Overall WFL for tasks assessed           Lower Extremity Assessment: Overall WFL for tasks assessed         Communication   Communication: No difficulties  Cognition Arousal/Alertness:  Awake/alert Behavior During Therapy: WFL for tasks assessed/performed Overall Cognitive Status: Within Functional Limits for tasks assessed                      General Comments      Exercises        Assessment/Plan    PT Assessment Patent does not need any further PT services  PT Diagnosis Difficulty walking   PT Problem List    PT Treatment Interventions     PT Goals (Current goals can be found in the Care Plan section) Acute Rehab PT Goals PT Goal Formulation: All assessment and education complete, DC therapy    Frequency     Barriers to discharge        Co-evaluation               End of Session   Activity Tolerance: Patient tolerated treatment well Patient left: in bed Nurse Communication: Mobility status         Time: 1204-1212 PT Time Calculation (min): 8 min   Charges:   PT Evaluation $Initial PT Evaluation Tier I: 1 Procedure     PT G Codes:  Shataya Winkles 02/28/2014, 1:40 PM  [place]

## 2014-02-28 NOTE — Discharge Summary (Signed)
DISCHARGE SUMMARY  Arthur Armstrong  MR#: [id number]  DOB:1927/01/04  Date of Admission: 02/27/2014 Date of Discharge: 02/28/2014  Attending Physician:Arthur Armstrong  Patient's QIH:[id number],GLOVFIE W, MD  Discharge Diagnoses: Active Problems:   Syncope, vasovagal CHF Afib  BPH   Discharge Medications:   Medication List         apixaban 5 MG Tabs tablet  Commonly known as:  ELIQUIS  Take 1 tablet (5 mg total) by mouth every 12 (twelve) hours.     carvedilol 6.25 MG tablet  Commonly known as:  COREG  Take 6.25-12.5 mg by mouth 2 (two) times daily with a meal. Per [employer] dose is 6.25 bid     FLOMAX 0.4 MG Caps capsule  Generic drug:  tamsulosin  Take 0.4 mg by mouth at bedtime.     furosemide 40 MG tablet  Commonly known as:  LASIX  Take 1 tablet (40 mg total) by mouth 2 (two) times daily.     niacin 500 MG CR capsule  Take 500 mg by mouth daily.     potassium chloride SA 20 MEQ tablet  Commonly known as:  K-DUR,KLOR-CON  Take 40 mEq by mouth 2 (two) times daily.     valsartan 80 MG tablet  Commonly known as:  DIOVAN  80 mg 2 (two) times daily.        Hospital Procedures: Dg Chest 2 View  02/27/2014   CLINICAL DATA:  Dizziness. Near syncope this morning. History of CHF.  EXAM: CHEST  2 VIEW  COMPARISON:  03/31/2012.  FINDINGS: There are changes from previous cardiac surgery and mitral valve replacement, stable. Cardiac silhouette is mildly enlarged. No mediastinal or hilar masses. Left anterior chest wall defibrillator cardioverter is unchanged, with leads well-positioned.  There is mild reticular scarring in the lung bases, stable. No lung consolidation or edema. No pleural effusion or pneumothorax.  Bony thorax is demineralized but grossly intact.  IMPRESSION: No acute cardiopulmonary disease.   Electronically Signed   By: Arthur Manes M.D.   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 HE felt a burning sensation in his legs. Then he felt weak. Speech Was slurred. He was wobbly. Then he almost fell . His son [name]. Then caught him. He was not responsive for a bout a minute or less. Then he Was lying down. He doesn't remember falling. He remembers waking up. No seizure activity. He opened eyes and said he was okay. EMS came and brought him in. HE is found to be persistently orthostatic in the ER despite some IV hydration. He denies sob or cp. No edema. Moving bowel and bladder okay. Appetite and oral intake has been nL lately. Denies any fever illness lately.    Hospital Course: Patient had a reduction in his diuretics, encouraged increased PO intake.  Watched on Tele and EKG repeated in AM.  Enzymes negative in ER.  Cr returning to normal.  Able to walk under his own power and no orthostasis noted on evaluation prior to discharge.  Day of Discharge Exam BP 111/64  Pulse 76  Temp(Src) 97.7 F (36.5 C) (Oral)  Resp 18  Ht 5\' 7"  (1.702 m)  Wt 76.9 kg (169 lb 8.5 oz)  BMI 26.55 kg/m2  SpO2 95%  Physical Exam: General appearance: alert, cooperative and appears stated age  Neck: no adenopathy, no carotid bruit, no JVD, supple, symmetrical, trachea midline and thyroid not  enlarged, symmetric, no tenderness/mass/nodules  Resp: clear to auscultation bilaterally  Cardio: irregular rate and rhythm, S1, S2 normal, no murmur, click, rub or gallop  GI: soft, non-tender; bowel sounds normal; no masses, no organomegaly  Extremities: extremities normal, atraumatic, no cyanosis or edema  Neurologic: Grossly normal  Discharge Labs:  Recent Labs  02/27/14 1207 02/27/14 1905 02/28/14 0339  NA 143  --  140  K 5.2  --  4.3  CL 104  --  104  CO2 28  --  24  GLUCOSE 164*  --  93  BUN 47*  --  40*  CREATININE 1.82*  --  1.64*  CALCIUM 9.6  --  9.2  MG  --  2.4  --   PHOS  --  2.8  --     Recent Labs  02/28/14 0339  AST 12  ALT 8  ALKPHOS 85  BILITOT 0.5  PROT  6.7  ALBUMIN 3.3*    Recent Labs  02/27/14 1207 02/28/14 0339  WBC 5.6 5.4  NEUTROABS 4.1 3.4  HGB 13.0 12.1*  HCT 39.6 36.7*  MCV 95.4 95.3  PLT 134* 124*   Lab Results  Component Value Date   INR 1.23 07/23/2010   INR 1.16 07/22/2010   INR 1.22 07/21/2010    Recent Labs  02/27/14 1905  TSH 1.770    Discharge instructions:  Take reduced Lasix dose as discussed   Disposition: Home with daughter Follow-up Appts: Follow-up with Dr. Osborne Casco at [facility] in 1 week.  Call for appointment.  Condition on Discharge: Stable   Signed: Kylene Armstrong Armstrong 02/28/2014, 1:26 PM

## 2014-02-28 NOTE — Progress Notes (Signed)
UR completed Rudransh Bellanca K. Duanne Duchesne, RN, BSN, Ellis, CCM  02/28/2014 12:46 PM

## 2014-03-01 NOTE — ED Provider Notes (Signed)
Medical screening examination/treatment/procedure(s) were conducted as a shared visit with non-physician practitioner(s) and myself.  I personally evaluated the patient during the encounter.   EKG Interpretation   Date/Time:  Sunday February 27 2014 11:50:59 EDT Ventricular Rate:  75 PR Interval:    QRS Duration: 158 QT Interval:  500 QTC Calculation: 559 R Axis:   -103 Text Interpretation:  Atrial fibrillation Ventricular bigeminy Right  bundle branch block Probable lateral infarct, age indeterminate ED  PHYSICIAN INTERPRETATION AVAILABLE IN [facility] Confirmed by TEST,  Record ([id number]) on 03/01/2014 7:24:37 AM     Patient with near syncopal event. May be due to Lasix. Creatinine slightly increased. Will admit to internal medicine. Does have low ejection fraction. Patient is overall well-appearing  Jasper Riling. Alvino Chapel, MD 03/01/14 2220

## 2014-03-07 ENCOUNTER — Other Ambulatory Visit: Payer: Self-pay | Admitting: Internal Medicine

## 2014-03-07 DIAGNOSIS — E119 Type 2 diabetes mellitus without complications: Secondary | ICD-10-CM | POA: Diagnosis not present

## 2014-03-07 DIAGNOSIS — I10 Essential (primary) hypertension: Secondary | ICD-10-CM | POA: Diagnosis not present

## 2014-03-23 DIAGNOSIS — M25521 Pain in right elbow: Secondary | ICD-10-CM | POA: Diagnosis not present

## 2014-03-23 DIAGNOSIS — M19021 Primary osteoarthritis, right elbow: Secondary | ICD-10-CM | POA: Diagnosis not present

## 2014-03-23 DIAGNOSIS — M7021 Olecranon bursitis, right elbow: Secondary | ICD-10-CM | POA: Diagnosis not present

## 2014-03-29 DIAGNOSIS — I509 Heart failure, unspecified: Secondary | ICD-10-CM | POA: Diagnosis not present

## 2014-03-29 DIAGNOSIS — I10 Essential (primary) hypertension: Secondary | ICD-10-CM | POA: Diagnosis not present

## 2014-03-29 DIAGNOSIS — Z6827 Body mass index (BMI) 27.0-27.9, adult: Secondary | ICD-10-CM | POA: Diagnosis not present

## 2014-03-29 DIAGNOSIS — I480 Paroxysmal atrial fibrillation: Secondary | ICD-10-CM | POA: Diagnosis not present

## 2014-03-29 DIAGNOSIS — I420 Dilated cardiomyopathy: Secondary | ICD-10-CM | POA: Diagnosis not present

## 2014-03-29 DIAGNOSIS — E119 Type 2 diabetes mellitus without complications: Secondary | ICD-10-CM | POA: Diagnosis not present

## 2014-03-29 DIAGNOSIS — R55 Syncope and collapse: Secondary | ICD-10-CM | POA: Diagnosis not present

## 2014-03-29 DIAGNOSIS — Z95810 Presence of automatic (implantable) cardiac defibrillator: Secondary | ICD-10-CM | POA: Diagnosis not present

## 2014-04-22 DIAGNOSIS — M19021 Primary osteoarthritis, right elbow: Secondary | ICD-10-CM | POA: Diagnosis not present

## 2014-04-22 DIAGNOSIS — M7021 Olecranon bursitis, right elbow: Secondary | ICD-10-CM | POA: Diagnosis not present

## 2014-04-22 DIAGNOSIS — M25521 Pain in right elbow: Secondary | ICD-10-CM | POA: Diagnosis not present

## 2014-04-25 ENCOUNTER — Ambulatory Visit (INDEPENDENT_AMBULATORY_CARE_PROVIDER_SITE_OTHER): Payer: Medicare Other | Admitting: *Deleted

## 2014-04-25 ENCOUNTER — Encounter: Payer: Self-pay | Admitting: Internal Medicine

## 2014-04-25 DIAGNOSIS — I429 Cardiomyopathy, unspecified: Secondary | ICD-10-CM | POA: Diagnosis not present

## 2014-04-25 DIAGNOSIS — I428 Other cardiomyopathies: Secondary | ICD-10-CM

## 2014-04-25 LAB — MDC_IDC_ENUM_SESS_TYPE_REMOTE
Battery Remaining Longevity: 31 mo
Battery Voltage: 2.89 V
Brady Statistic AP VS Percent: 2 %
Brady Statistic AS VS Percent: 13 %
Date Time Interrogation Session: 20151221073849
HIGH POWER IMPEDANCE MEASURED VALUE: 46 Ohm
Implantable Pulse Generator Serial Number: 623851
Lead Channel Impedance Value: 430 Ohm
Lead Channel Impedance Value: 530 Ohm
Lead Channel Pacing Threshold Amplitude: 0.5 V
Lead Channel Pacing Threshold Amplitude: 1.5 V
Lead Channel Pacing Threshold Pulse Width: 0.5 ms
Lead Channel Pacing Threshold Pulse Width: 0.8 ms
Lead Channel Sensing Intrinsic Amplitude: 1.8 mV
Lead Channel Setting Pacing Amplitude: 2 V
Lead Channel Setting Pacing Amplitude: 2.5 V
Lead Channel Setting Pacing Pulse Width: 0.5 ms
Lead Channel Setting Pacing Pulse Width: 0.8 ms
MDC IDC MSMT BATTERY REMAINING PERCENTAGE: 40 %
MDC IDC MSMT LEADCHNL RV IMPEDANCE VALUE: 450 Ohm
MDC IDC MSMT LEADCHNL RV SENSING INTR AMPL: 12 mV
MDC IDC SET LEADCHNL LV PACING AMPLITUDE: 2.5 V
MDC IDC SET LEADCHNL RV SENSING SENSITIVITY: 0.5 mV
MDC IDC SET ZONE DETECTION INTERVAL: 300 ms
MDC IDC STAT BRADY AP VP PERCENT: 1 %
MDC IDC STAT BRADY AS VP PERCENT: 87 %
MDC IDC STAT BRADY RA PERCENT PACED: 1 %
Zone Setting Detection Interval: 250 ms
Zone Setting Detection Interval: 375 ms

## 2014-04-25 NOTE — Progress Notes (Signed)
Remote ICD transmission.   

## 2014-05-03 ENCOUNTER — Encounter: Payer: Self-pay | Admitting: Cardiology

## 2014-06-08 DIAGNOSIS — D649 Anemia, unspecified: Secondary | ICD-10-CM | POA: Diagnosis not present

## 2014-06-08 DIAGNOSIS — N183 Chronic kidney disease, stage 3 (moderate): Secondary | ICD-10-CM | POA: Diagnosis not present

## 2014-06-08 DIAGNOSIS — I509 Heart failure, unspecified: Secondary | ICD-10-CM | POA: Diagnosis not present

## 2014-06-08 DIAGNOSIS — J439 Emphysema, unspecified: Secondary | ICD-10-CM | POA: Diagnosis not present

## 2014-06-08 DIAGNOSIS — I420 Dilated cardiomyopathy: Secondary | ICD-10-CM | POA: Diagnosis not present

## 2014-06-08 DIAGNOSIS — Z9981 Dependence on supplemental oxygen: Secondary | ICD-10-CM | POA: Diagnosis not present

## 2014-06-08 DIAGNOSIS — I480 Paroxysmal atrial fibrillation: Secondary | ICD-10-CM | POA: Diagnosis not present

## 2014-06-08 DIAGNOSIS — N401 Enlarged prostate with lower urinary tract symptoms: Secondary | ICD-10-CM | POA: Diagnosis not present

## 2014-06-08 DIAGNOSIS — E119 Type 2 diabetes mellitus without complications: Secondary | ICD-10-CM | POA: Diagnosis not present

## 2014-06-08 DIAGNOSIS — Z6828 Body mass index (BMI) 28.0-28.9, adult: Secondary | ICD-10-CM | POA: Diagnosis not present

## 2014-06-29 ENCOUNTER — Other Ambulatory Visit: Payer: Self-pay | Admitting: Internal Medicine

## 2014-07-25 ENCOUNTER — Ambulatory Visit (INDEPENDENT_AMBULATORY_CARE_PROVIDER_SITE_OTHER): Payer: Medicare Other | Admitting: *Deleted

## 2014-07-25 DIAGNOSIS — I428 Other cardiomyopathies: Secondary | ICD-10-CM

## 2014-07-25 DIAGNOSIS — I5023 Acute on chronic systolic (congestive) heart failure: Secondary | ICD-10-CM

## 2014-07-25 DIAGNOSIS — I429 Cardiomyopathy, unspecified: Secondary | ICD-10-CM

## 2014-07-25 LAB — MDC_IDC_ENUM_SESS_TYPE_REMOTE
Battery Remaining Longevity: 29 mo
Battery Remaining Percentage: 36 %
Battery Voltage: 2.87 V
Brady Statistic AP VP Percent: 1 %
Brady Statistic AS VP Percent: 90 %
Brady Statistic AS VS Percent: 9.7 %
Brady Statistic RA Percent Paced: 1 %
Date Time Interrogation Session: 20160321060608
HIGH POWER IMPEDANCE MEASURED VALUE: 47 Ohm
Implantable Pulse Generator Serial Number: 623851
Lead Channel Impedance Value: 440 Ohm
Lead Channel Impedance Value: 450 Ohm
Lead Channel Impedance Value: 580 Ohm
Lead Channel Pacing Threshold Amplitude: 0.5 V
Lead Channel Pacing Threshold Amplitude: 1.5 V
Lead Channel Pacing Threshold Pulse Width: 0.8 ms
Lead Channel Sensing Intrinsic Amplitude: 12 mV
Lead Channel Setting Pacing Amplitude: 2 V
Lead Channel Setting Pacing Amplitude: 2.5 V
Lead Channel Setting Pacing Amplitude: 2.5 V
Lead Channel Setting Pacing Pulse Width: 0.5 ms
Lead Channel Setting Pacing Pulse Width: 0.8 ms
MDC IDC MSMT LEADCHNL RA SENSING INTR AMPL: 1.8 mV
MDC IDC MSMT LEADCHNL RV PACING THRESHOLD PULSEWIDTH: 0.5 ms
MDC IDC SET LEADCHNL RV SENSING SENSITIVITY: 0.5 mV
MDC IDC STAT BRADY AP VS PERCENT: 2 %
Zone Setting Detection Interval: 250 ms
Zone Setting Detection Interval: 300 ms
Zone Setting Detection Interval: 375 ms

## 2014-07-25 NOTE — Progress Notes (Signed)
Remote ICD transmission.   

## 2014-08-11 ENCOUNTER — Encounter: Payer: Self-pay | Admitting: Cardiology

## 2014-08-15 ENCOUNTER — Encounter: Payer: Self-pay | Admitting: Internal Medicine

## 2014-08-15 ENCOUNTER — Other Ambulatory Visit: Payer: Self-pay | Admitting: Internal Medicine

## 2014-08-22 DIAGNOSIS — I509 Heart failure, unspecified: Secondary | ICD-10-CM | POA: Diagnosis not present

## 2014-08-22 DIAGNOSIS — R05 Cough: Secondary | ICD-10-CM | POA: Diagnosis not present

## 2014-08-22 DIAGNOSIS — J189 Pneumonia, unspecified organism: Secondary | ICD-10-CM | POA: Diagnosis not present

## 2014-08-22 DIAGNOSIS — J439 Emphysema, unspecified: Secondary | ICD-10-CM | POA: Diagnosis not present

## 2014-08-22 DIAGNOSIS — R531 Weakness: Secondary | ICD-10-CM | POA: Diagnosis not present

## 2014-08-22 DIAGNOSIS — Z6827 Body mass index (BMI) 27.0-27.9, adult: Secondary | ICD-10-CM | POA: Diagnosis not present

## 2014-08-28 ENCOUNTER — Other Ambulatory Visit: Payer: Self-pay | Admitting: Internal Medicine

## 2014-09-05 DIAGNOSIS — Z125 Encounter for screening for malignant neoplasm of prostate: Secondary | ICD-10-CM | POA: Diagnosis not present

## 2014-09-05 DIAGNOSIS — I10 Essential (primary) hypertension: Secondary | ICD-10-CM | POA: Diagnosis not present

## 2014-09-05 DIAGNOSIS — Z0000 Encounter for general adult medical examination without abnormal findings: Secondary | ICD-10-CM | POA: Diagnosis not present

## 2014-09-05 DIAGNOSIS — E119 Type 2 diabetes mellitus without complications: Secondary | ICD-10-CM | POA: Diagnosis not present

## 2014-09-08 DIAGNOSIS — Z1212 Encounter for screening for malignant neoplasm of rectum: Secondary | ICD-10-CM | POA: Diagnosis not present

## 2014-09-10 ENCOUNTER — Other Ambulatory Visit: Payer: Self-pay | Admitting: Internal Medicine

## 2014-09-12 DIAGNOSIS — R0602 Shortness of breath: Secondary | ICD-10-CM | POA: Diagnosis not present

## 2014-09-12 DIAGNOSIS — Z1389 Encounter for screening for other disorder: Secondary | ICD-10-CM | POA: Diagnosis not present

## 2014-09-12 DIAGNOSIS — N184 Chronic kidney disease, stage 4 (severe): Secondary | ICD-10-CM | POA: Diagnosis not present

## 2014-09-12 DIAGNOSIS — Z6827 Body mass index (BMI) 27.0-27.9, adult: Secondary | ICD-10-CM | POA: Diagnosis not present

## 2014-09-12 DIAGNOSIS — J811 Chronic pulmonary edema: Secondary | ICD-10-CM | POA: Diagnosis not present

## 2014-09-12 DIAGNOSIS — I420 Dilated cardiomyopathy: Secondary | ICD-10-CM | POA: Diagnosis not present

## 2014-09-12 DIAGNOSIS — J439 Emphysema, unspecified: Secondary | ICD-10-CM | POA: Diagnosis not present

## 2014-09-12 DIAGNOSIS — I509 Heart failure, unspecified: Secondary | ICD-10-CM | POA: Diagnosis not present

## 2014-09-12 DIAGNOSIS — Z9981 Dependence on supplemental oxygen: Secondary | ICD-10-CM | POA: Diagnosis not present

## 2014-09-12 DIAGNOSIS — I10 Essential (primary) hypertension: Secondary | ICD-10-CM | POA: Diagnosis not present

## 2014-09-12 DIAGNOSIS — Z0000 Encounter for general adult medical examination without abnormal findings: Secondary | ICD-10-CM | POA: Diagnosis not present

## 2014-09-12 DIAGNOSIS — Z95810 Presence of automatic (implantable) cardiac defibrillator: Secondary | ICD-10-CM | POA: Diagnosis not present

## 2014-09-14 DIAGNOSIS — I509 Heart failure, unspecified: Secondary | ICD-10-CM | POA: Diagnosis not present

## 2014-09-14 DIAGNOSIS — Z9981 Dependence on supplemental oxygen: Secondary | ICD-10-CM | POA: Diagnosis not present

## 2014-09-14 DIAGNOSIS — Z1389 Encounter for screening for other disorder: Secondary | ICD-10-CM | POA: Diagnosis not present

## 2014-09-14 DIAGNOSIS — I420 Dilated cardiomyopathy: Secondary | ICD-10-CM | POA: Diagnosis not present

## 2014-09-14 DIAGNOSIS — J811 Chronic pulmonary edema: Secondary | ICD-10-CM | POA: Diagnosis not present

## 2014-09-14 DIAGNOSIS — Z0000 Encounter for general adult medical examination without abnormal findings: Secondary | ICD-10-CM | POA: Diagnosis not present

## 2014-09-14 DIAGNOSIS — R0602 Shortness of breath: Secondary | ICD-10-CM | POA: Diagnosis not present

## 2014-09-14 DIAGNOSIS — J439 Emphysema, unspecified: Secondary | ICD-10-CM | POA: Diagnosis not present

## 2014-09-14 DIAGNOSIS — Z95810 Presence of automatic (implantable) cardiac defibrillator: Secondary | ICD-10-CM | POA: Diagnosis not present

## 2014-09-14 DIAGNOSIS — Z6827 Body mass index (BMI) 27.0-27.9, adult: Secondary | ICD-10-CM | POA: Diagnosis not present

## 2014-09-14 DIAGNOSIS — N184 Chronic kidney disease, stage 4 (severe): Secondary | ICD-10-CM | POA: Diagnosis not present

## 2014-09-14 DIAGNOSIS — I10 Essential (primary) hypertension: Secondary | ICD-10-CM | POA: Diagnosis not present

## 2014-09-27 ENCOUNTER — Other Ambulatory Visit: Payer: Self-pay | Admitting: Internal Medicine

## 2014-09-29 ENCOUNTER — Other Ambulatory Visit: Payer: Self-pay | Admitting: Physician Assistant

## 2014-09-29 DIAGNOSIS — L820 Inflamed seborrheic keratosis: Secondary | ICD-10-CM | POA: Diagnosis not present

## 2014-09-29 DIAGNOSIS — C44319 Basal cell carcinoma of skin of other parts of face: Secondary | ICD-10-CM | POA: Diagnosis not present

## 2014-09-29 DIAGNOSIS — D225 Melanocytic nevi of trunk: Secondary | ICD-10-CM | POA: Diagnosis not present

## 2014-09-29 DIAGNOSIS — L814 Other melanin hyperpigmentation: Secondary | ICD-10-CM | POA: Diagnosis not present

## 2014-09-29 DIAGNOSIS — D1801 Hemangioma of skin and subcutaneous tissue: Secondary | ICD-10-CM | POA: Diagnosis not present

## 2014-09-29 DIAGNOSIS — D485 Neoplasm of uncertain behavior of skin: Secondary | ICD-10-CM | POA: Diagnosis not present

## 2014-10-04 ENCOUNTER — Other Ambulatory Visit: Payer: Self-pay

## 2014-10-04 ENCOUNTER — Ambulatory Visit (INDEPENDENT_AMBULATORY_CARE_PROVIDER_SITE_OTHER): Payer: Medicare Other | Admitting: Internal Medicine

## 2014-10-04 ENCOUNTER — Encounter: Payer: Self-pay | Admitting: Internal Medicine

## 2014-10-04 VITALS — BP 122/78 | HR 64 | Ht 66.0 in | Wt 177.6 lb

## 2014-10-04 DIAGNOSIS — I428 Other cardiomyopathies: Secondary | ICD-10-CM

## 2014-10-04 DIAGNOSIS — I5023 Acute on chronic systolic (congestive) heart failure: Secondary | ICD-10-CM

## 2014-10-04 DIAGNOSIS — I10 Essential (primary) hypertension: Secondary | ICD-10-CM

## 2014-10-04 DIAGNOSIS — I4891 Unspecified atrial fibrillation: Secondary | ICD-10-CM | POA: Diagnosis not present

## 2014-10-04 DIAGNOSIS — E785 Hyperlipidemia, unspecified: Secondary | ICD-10-CM

## 2014-10-04 DIAGNOSIS — I429 Cardiomyopathy, unspecified: Secondary | ICD-10-CM

## 2014-10-04 DIAGNOSIS — Z95810 Presence of automatic (implantable) cardiac defibrillator: Secondary | ICD-10-CM

## 2014-10-04 LAB — CUP PACEART INCLINIC DEVICE CHECK
Brady Statistic RA Percent Paced: 0.57 %
Brady Statistic RV Percent Paced: 93 %
Date Time Interrogation Session: 20160531133303
HighPow Impedance: 42.0619
Lead Channel Impedance Value: 425 Ohm
Lead Channel Impedance Value: 450 Ohm
Lead Channel Impedance Value: 525 Ohm
Lead Channel Pacing Threshold Pulse Width: 0.5 ms
Lead Channel Pacing Threshold Pulse Width: 0.8 ms
Lead Channel Setting Pacing Amplitude: 2 V
Lead Channel Setting Pacing Amplitude: 2.5 V
Lead Channel Setting Pacing Amplitude: 2.5 V
Lead Channel Setting Pacing Pulse Width: 0.5 ms
Lead Channel Setting Pacing Pulse Width: 0.8 ms
Lead Channel Setting Sensing Sensitivity: 0.5 mV
MDC IDC MSMT BATTERY REMAINING LONGEVITY: 26.4 mo
MDC IDC MSMT LEADCHNL LV PACING THRESHOLD AMPLITUDE: 1 V
MDC IDC MSMT LEADCHNL RA SENSING INTR AMPL: 0.9 mV
MDC IDC MSMT LEADCHNL RV PACING THRESHOLD AMPLITUDE: 0.75 V
MDC IDC MSMT LEADCHNL RV SENSING INTR AMPL: 12 mV
MDC IDC SET ZONE DETECTION INTERVAL: 300 ms
Pulse Gen Serial Number: 623851
Zone Setting Detection Interval: 250 ms
Zone Setting Detection Interval: 375 ms

## 2014-10-04 NOTE — Assessment & Plan Note (Signed)
He will maintain a low fat diet and niaspan. He cannot tolerate statins.

## 2014-10-04 NOTE — Assessment & Plan Note (Signed)
His [employer] BiV ICD is working normally. Will recheck in several months.  

## 2014-10-04 NOTE — Patient Instructions (Signed)
Medication Instructions:  Your physician recommends that you continue on your current medications as directed. Please refer to the Current Medication list given to you today.   Labwork: None ordered  Testing/Procedures: None ordered  Follow-Up: Your physician wants you to follow-up in: 12 months with Dr Knox Saliva will receive a reminder letter in the mail two months in advance. If you don't receive a letter, please call our office to schedule the follow-up appointment.  Remote monitoring is used to monitor your Pacemaker or ICD from home. This monitoring reduces the number of office visits required to check your device to one time per year. It allows [place] to keep an eye on the functioning of your device to ensure it is working properly. You are scheduled for a device check from home on 01/03/15. You may send your transmission at any time that day. If you have a wireless device, the transmission will be sent automatically. After your physician reviews your transmission, you will receive a postcard with your next transmission date.    Any Other Special Instructions Will Be Listed Below (If Applicable).

## 2014-10-04 NOTE — Assessment & Plan Note (Signed)
His symptoms remain class 2. He will continue his current meds and maintain a low sodium diet. 

## 2014-10-04 NOTE — Progress Notes (Signed)
HPI [name] returns today for followup. He is a very pleasant 81 year old man with a history of chronic atrial fibrillation, complete heart block, chronic systolic heart failure, status post biventricular ICD implantation. He has underlying nonischemic cardiomyopathy. His heart failure symptoms have been class II. He denies chest pain or shortness of breath. No dietary indiscretion. No syncope. He remains active working in his garden.  No Known Allergies   Current Outpatient Prescriptions  Medication Sig Dispense Refill  . carvedilol (COREG) 6.25 MG tablet TAKE 1 TABLET BY MOUTH TWICE DAILY WITH MEALS 180 tablet 0  . ELIQUIS 5 MG TABS tablet TAKE ONE TABLET BY MOUTH EVERY 12 HOURS 60 tablet 0  . furosemide (LASIX) 40 MG tablet Take 1-2 tablets by mouth twice daily as directed    . niacin (NIASPAN) 500 MG CR tablet TAKE 1 TABLET BY MOUTH EVERY DAY 90 tablet 0  . potassium chloride SA (K-DUR,KLOR-CON) 20 MEQ tablet TAKE 2 TABLETS BY MOUTH TWICE DAILY 360 tablet 0  . Tamsulosin HCl (FLOMAX) 0.4 MG CAPS Take 0.4 mg by mouth at bedtime.      . valsartan (DIOVAN) 80 MG tablet Take 80 mg by mouth 2 (two) times daily.      No current facility-administered medications for this visit.     Past Medical History  Diagnosis Date  . Automatic implantable cardiac defibrillator in situ     BiV pacer  . NICM (nonischemic cardiomyopathy)     nicm ef 30-35%,nonob cad-cath 2006,echo 2/12:ef30-35%.mod lvh,inf hk,mild-mod lae and mild rae,pasp 35  . Systolic CHF   . Unspecified essential hypertension   . Atrial fibrillation   . S/P mitral valve repair     2006  . HTN (hypertension)   . HLD (hyperlipidemia)   . Coronary artery disease, non-occlusive     cath 2006 pLM 20%, pLAD 40%; mCFX 20%  . PFO (patent foramen ovale)     s/p repair 2006  . Chronic systolic CHF (congestive heart failure)   . ICD (implantable cardiac defibrillator) in place     BIV ICD  . Pacemaker     BIV ICD  . Shortness of  breath   . Hernia, ventral     ROS:   All systems reviewed and negative except as noted in the HPI.   Past Surgical History  Procedure Laterality Date  . Rt. shoulder    . Bilateral knee arthroscopy    . Colonoscopy with polypectomy    . Intraoperative transesophageal echocardiogram    . Median sterotomy for mitral valve repair    . Implantation of a dual chamber biventricular icd    . Hip pinning  3/2/012     Family History  Problem Relation Age of Onset  . Heart disease Neg Hx     Rheumatic or valvular heart disease   . Cardiomyopathy Neg Hx      History   Social History  . Marital Status: Married    Spouse Name: N/A  . Number of Children: 6  . Years of Education: N/A   Occupational History  . 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 Smoking status: Former Smoker -- 1.00 packs/day for 5 years    Quit date: 05/06/1961  . Smokeless tobacco: Former Systems developer    Quit date: 05/07/1951  . Alcohol Use: No  . Drug Use: No  . Sexual Activity: Not Currently  Other Topics Concern  . Not on file   Social History Narrative   Lives with his wife in [place].   Has 6 grown children.   Remains quite active.   Blood pressure 122/78, pulse 64, respirations 18  Physical Exam:  Well appearing 81 yo woman, NAD HEENT: Unremarkable Neck:  7 cm JVD, no thyromegally Lungs:  Clear with no wheezes, rales, or rhonchi. HEART:  Regular rate rhythm, no murmurs, no rubs, no clicks Abd:  soft, positive bowel sounds, no organomegally, no rebound, no guarding Ext:  2 plus pulses, no edema, no cyanosis, no clubbing Skin:  No rashes no nodules Neuro:  CN II through XII intact, motor grossly intact  DEVICE  Normal device function.  See PaceArt for details.   Assess/Plan:

## 2014-10-04 NOTE — Assessment & Plan Note (Signed)
His blood pressure is well controlled today. Will recheck in several months.

## 2014-10-12 ENCOUNTER — Encounter: Payer: Self-pay | Admitting: Internal Medicine

## 2014-11-01 DIAGNOSIS — Z961 Presence of intraocular lens: Secondary | ICD-10-CM | POA: Diagnosis not present

## 2014-11-01 DIAGNOSIS — H52203 Unspecified astigmatism, bilateral: Secondary | ICD-10-CM | POA: Diagnosis not present

## 2014-11-01 DIAGNOSIS — H04123 Dry eye syndrome of bilateral lacrimal glands: Secondary | ICD-10-CM | POA: Diagnosis not present

## 2014-11-01 DIAGNOSIS — H43813 Vitreous degeneration, bilateral: Secondary | ICD-10-CM | POA: Diagnosis not present

## 2014-11-04 DIAGNOSIS — Z08 Encounter for follow-up examination after completed treatment for malignant neoplasm: Secondary | ICD-10-CM | POA: Diagnosis not present

## 2014-11-04 DIAGNOSIS — Z85828 Personal history of other malignant neoplasm of skin: Secondary | ICD-10-CM | POA: Diagnosis not present

## 2014-11-07 ENCOUNTER — Other Ambulatory Visit: Payer: Self-pay | Admitting: Internal Medicine

## 2014-11-08 ENCOUNTER — Other Ambulatory Visit: Payer: Self-pay

## 2014-11-08 MED ORDER — APIXABAN 5 MG PO TABS: 5.0000 mg | ORAL_TABLET | Freq: Two times a day (BID) | ORAL | Status: DC

## 2014-12-12 ENCOUNTER — Other Ambulatory Visit: Payer: Self-pay | Admitting: Internal Medicine

## 2014-12-15 ENCOUNTER — Other Ambulatory Visit: Payer: Self-pay | Admitting: Internal Medicine

## 2014-12-15 MED ORDER — CARVEDILOL 6.25 MG PO TABS: 6.2500 mg | ORAL_TABLET | Freq: Two times a day (BID) | ORAL | Status: DC

## 2015-01-02 DIAGNOSIS — Z6827 Body mass index (BMI) 27.0-27.9, adult: Secondary | ICD-10-CM | POA: Diagnosis not present

## 2015-01-02 DIAGNOSIS — I480 Paroxysmal atrial fibrillation: Secondary | ICD-10-CM | POA: Diagnosis not present

## 2015-01-02 DIAGNOSIS — J209 Acute bronchitis, unspecified: Secondary | ICD-10-CM | POA: Diagnosis not present

## 2015-01-02 DIAGNOSIS — I420 Dilated cardiomyopathy: Secondary | ICD-10-CM | POA: Diagnosis not present

## 2015-01-02 DIAGNOSIS — R05 Cough: Secondary | ICD-10-CM | POA: Diagnosis not present

## 2015-01-03 ENCOUNTER — Encounter: Payer: Self-pay | Admitting: Internal Medicine

## 2015-01-03 ENCOUNTER — Ambulatory Visit (INDEPENDENT_AMBULATORY_CARE_PROVIDER_SITE_OTHER): Payer: Medicare Other | Admitting: *Deleted

## 2015-01-03 DIAGNOSIS — I428 Other cardiomyopathies: Secondary | ICD-10-CM

## 2015-01-03 DIAGNOSIS — I429 Cardiomyopathy, unspecified: Secondary | ICD-10-CM | POA: Diagnosis not present

## 2015-01-03 DIAGNOSIS — I5023 Acute on chronic systolic (congestive) heart failure: Secondary | ICD-10-CM

## 2015-01-03 NOTE — Progress Notes (Signed)
Remote ICD transmission.   

## 2015-01-06 LAB — CUP PACEART REMOTE DEVICE CHECK
Battery Remaining Longevity: 25 mo
Brady Statistic AP VP Percent: 1 %
Brady Statistic AP VS Percent: 2 %
Brady Statistic AS VP Percent: 96 %
Brady Statistic AS VS Percent: 4.1 %
Brady Statistic RA Percent Paced: 1 %
HighPow Impedance: 46 Ohm
Lead Channel Impedance Value: 460 Ohm
Lead Channel Impedance Value: 490 Ohm
Lead Channel Impedance Value: 540 Ohm
Lead Channel Pacing Threshold Amplitude: 0.75 V
Lead Channel Pacing Threshold Pulse Width: 0.5 ms
Lead Channel Setting Pacing Amplitude: 2 V
Lead Channel Setting Pacing Amplitude: 2.5 V
MDC IDC MSMT BATTERY REMAINING PERCENTAGE: 32 %
MDC IDC MSMT BATTERY VOLTAGE: 2.86 V
MDC IDC MSMT LEADCHNL LV PACING THRESHOLD AMPLITUDE: 1 V
MDC IDC MSMT LEADCHNL LV PACING THRESHOLD PULSEWIDTH: 0.8 ms
MDC IDC MSMT LEADCHNL RA SENSING INTR AMPL: 0.9 mV
MDC IDC MSMT LEADCHNL RV SENSING INTR AMPL: 12 mV
MDC IDC PG SERIAL: 623851
MDC IDC SESS DTM: 20160830070058
MDC IDC SET LEADCHNL LV PACING PULSEWIDTH: 0.8 ms
MDC IDC SET LEADCHNL RV PACING AMPLITUDE: 2.5 V
MDC IDC SET LEADCHNL RV PACING PULSEWIDTH: 0.5 ms
MDC IDC SET LEADCHNL RV SENSING SENSITIVITY: 0.5 mV
MDC IDC SET ZONE DETECTION INTERVAL: 300 ms
Zone Setting Detection Interval: 250 ms
Zone Setting Detection Interval: 375 ms

## 2015-01-18 ENCOUNTER — Encounter: Payer: Self-pay | Admitting: Cardiology

## 2015-02-03 ENCOUNTER — Other Ambulatory Visit: Payer: Self-pay | Admitting: Physician Assistant

## 2015-02-03 DIAGNOSIS — C44319 Basal cell carcinoma of skin of other parts of face: Secondary | ICD-10-CM | POA: Diagnosis not present

## 2015-02-10 ENCOUNTER — Other Ambulatory Visit: Payer: Self-pay | Admitting: Internal Medicine

## 2015-02-17 ENCOUNTER — Telehealth: Payer: Self-pay | Admitting: Internal Medicine

## 2015-02-17 NOTE — Telephone Encounter (Signed)
Remote r/s'd to 12/5. Patient notified and voiced understanding.

## 2015-02-17 NOTE — Telephone Encounter (Signed)
New problem   Pt has remote check on 11.29.16 and will be going out of town 11.20.16 thru 83.3.16 and wish to resched appt. Please call pt.

## 2015-03-02 ENCOUNTER — Telehealth: Payer: Self-pay | Admitting: Internal Medicine

## 2015-03-02 NOTE — Telephone Encounter (Signed)
Pt emergency contact spoke w/ device tech about this matter. Pt to call back later today.

## 2015-03-02 NOTE — Telephone Encounter (Signed)
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  The Dermatologist need to know date of defibulator inplant, type of defibulator and instructions on what to do if it fires.  Please call

## 2015-03-02 NOTE — Telephone Encounter (Signed)
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  Dermatolgist is doing a local to remove it.  He does not have to stop any medications. Want to make sure it is OK with Dr Lovena Le to have this removed under local anesthesia.  Please call

## 2015-03-02 NOTE — Telephone Encounter (Signed)
Will sent to device clinic triage.

## 2015-03-07 NOTE — Telephone Encounter (Signed)
Late entry from 03/02/15:  Patient's daughter called requesting information about patient's device for upcoming dermatologist appointment.  Patient's daughter went on to clarify that the dermatologist actually does not need the information, but that she just wanted to keep a record of the patient's device information.  I informed patient's daughter that I do not see a DPR on file so that I can speak with her about her father's care, but that if he verbalizes permission for me to disclose protected health information to her, that I would be happy to assist her.  She voices appreciation and states that she will have him call to give permission.

## 2015-03-07 NOTE — Telephone Encounter (Signed)
As of 03/07/15, have not received call from patient.  Closing encounter.

## 2015-03-13 DIAGNOSIS — Z1389 Encounter for screening for other disorder: Secondary | ICD-10-CM | POA: Diagnosis not present

## 2015-03-13 DIAGNOSIS — Z6827 Body mass index (BMI) 27.0-27.9, adult: Secondary | ICD-10-CM | POA: Diagnosis not present

## 2015-03-13 DIAGNOSIS — I420 Dilated cardiomyopathy: Secondary | ICD-10-CM | POA: Diagnosis not present

## 2015-03-13 DIAGNOSIS — E119 Type 2 diabetes mellitus without complications: Secondary | ICD-10-CM | POA: Diagnosis not present

## 2015-03-13 DIAGNOSIS — N401 Enlarged prostate with lower urinary tract symptoms: Secondary | ICD-10-CM | POA: Diagnosis not present

## 2015-03-13 DIAGNOSIS — I480 Paroxysmal atrial fibrillation: Secondary | ICD-10-CM | POA: Diagnosis not present

## 2015-03-13 DIAGNOSIS — I10 Essential (primary) hypertension: Secondary | ICD-10-CM | POA: Diagnosis not present

## 2015-03-13 DIAGNOSIS — I509 Heart failure, unspecified: Secondary | ICD-10-CM | POA: Diagnosis not present

## 2015-03-13 DIAGNOSIS — N184 Chronic kidney disease, stage 4 (severe): Secondary | ICD-10-CM | POA: Diagnosis not present

## 2015-03-13 DIAGNOSIS — Z23 Encounter for immunization: Secondary | ICD-10-CM | POA: Diagnosis not present

## 2015-03-13 DIAGNOSIS — Z95810 Presence of automatic (implantable) cardiac defibrillator: Secondary | ICD-10-CM | POA: Diagnosis not present

## 2015-03-17 DIAGNOSIS — C4441 Basal cell carcinoma of skin of scalp and neck: Secondary | ICD-10-CM | POA: Diagnosis not present

## 2015-03-21 ENCOUNTER — Telehealth: Payer: Self-pay | Admitting: Cardiology

## 2015-03-21 NOTE — Telephone Encounter (Signed)
Pt daughter called and she had some concern's about device recall w/ [facility]. Informed pt daughter that pt is being monitored remotely. Pt wanted to know if he could still go out of town in a couple weeks. Informed pt that this is ok and that if he wanted to feel more secure that he can take his home monitor w/ him. Pt knows to call our office if his device vibrates while he is out of town for future instructions.

## 2015-04-10 ENCOUNTER — Ambulatory Visit (INDEPENDENT_AMBULATORY_CARE_PROVIDER_SITE_OTHER): Payer: Medicare Other | Admitting: *Deleted

## 2015-04-10 DIAGNOSIS — I5023 Acute on chronic systolic (congestive) heart failure: Secondary | ICD-10-CM

## 2015-04-10 DIAGNOSIS — Z95810 Presence of automatic (implantable) cardiac defibrillator: Secondary | ICD-10-CM

## 2015-04-10 DIAGNOSIS — I429 Cardiomyopathy, unspecified: Secondary | ICD-10-CM | POA: Diagnosis not present

## 2015-04-10 DIAGNOSIS — I428 Other cardiomyopathies: Secondary | ICD-10-CM

## 2015-04-10 NOTE — Progress Notes (Signed)
Remote ICD transmission.   

## 2015-04-19 LAB — CUP PACEART REMOTE DEVICE CHECK
Brady Statistic AP VP Percent: 1 %
Brady Statistic AS VS Percent: 4.3 %
HighPow Impedance: 50 Ohm
Implantable Lead Implant Date: 20060802
Implantable Lead Implant Date: 20060802
Implantable Lead Location: 753859
Implantable Lead Location: 753860
Lead Channel Impedance Value: 560 Ohm
Lead Channel Pacing Threshold Amplitude: 1 V
Lead Channel Pacing Threshold Pulse Width: 0.5 ms
Lead Channel Sensing Intrinsic Amplitude: 12 mV
Lead Channel Setting Pacing Pulse Width: 0.5 ms
Lead Channel Setting Sensing Sensitivity: 0.5 mV
MDC IDC LEAD IMPLANT DT: 20060802
MDC IDC LEAD LOCATION: 753858
MDC IDC LEAD MODEL: 7001
MDC IDC MSMT BATTERY REMAINING LONGEVITY: 23 mo
MDC IDC MSMT BATTERY REMAINING PERCENTAGE: 29 %
MDC IDC MSMT BATTERY VOLTAGE: 2.84 V
MDC IDC MSMT LEADCHNL LV PACING THRESHOLD PULSEWIDTH: 0.8 ms
MDC IDC MSMT LEADCHNL RA IMPEDANCE VALUE: 460 Ohm
MDC IDC MSMT LEADCHNL RA SENSING INTR AMPL: 0.9 mV
MDC IDC MSMT LEADCHNL RV IMPEDANCE VALUE: 460 Ohm
MDC IDC MSMT LEADCHNL RV PACING THRESHOLD AMPLITUDE: 0.75 V
MDC IDC SESS DTM: 20161205093036
MDC IDC SET LEADCHNL LV PACING AMPLITUDE: 2.5 V
MDC IDC SET LEADCHNL LV PACING PULSEWIDTH: 0.8 ms
MDC IDC SET LEADCHNL RA PACING AMPLITUDE: 2 V
MDC IDC SET LEADCHNL RV PACING AMPLITUDE: 2.5 V
MDC IDC STAT BRADY AP VS PERCENT: 2 %
MDC IDC STAT BRADY AS VP PERCENT: 96 %
MDC IDC STAT BRADY RA PERCENT PACED: 1 %
Pulse Gen Serial Number: 623851

## 2015-04-21 ENCOUNTER — Encounter: Payer: Self-pay | Admitting: Cardiology

## 2015-04-27 ENCOUNTER — Encounter: Payer: Self-pay | Admitting: Internal Medicine

## 2015-05-11 ENCOUNTER — Encounter: Payer: Self-pay | Admitting: Internal Medicine

## 2015-05-11 ENCOUNTER — Ambulatory Visit (INDEPENDENT_AMBULATORY_CARE_PROVIDER_SITE_OTHER): Payer: Medicare Other | Admitting: Internal Medicine

## 2015-05-11 VITALS — BP 126/62 | HR 70 | Ht 66.0 in | Wt 167.0 lb

## 2015-05-11 DIAGNOSIS — I10 Essential (primary) hypertension: Secondary | ICD-10-CM | POA: Diagnosis not present

## 2015-05-11 DIAGNOSIS — Z95810 Presence of automatic (implantable) cardiac defibrillator: Secondary | ICD-10-CM

## 2015-05-11 DIAGNOSIS — I5023 Acute on chronic systolic (congestive) heart failure: Secondary | ICD-10-CM

## 2015-05-11 DIAGNOSIS — I482 Chronic atrial fibrillation, unspecified: Secondary | ICD-10-CM

## 2015-05-11 LAB — CUP PACEART INCLINIC DEVICE CHECK
Battery Remaining Longevity: 21.6
Brady Statistic RA Percent Paced: 0.68 %
Brady Statistic RV Percent Paced: 96 %
Date Time Interrogation Session: 20170105162021
HighPow Impedance: 46 Ohm
Implantable Lead Implant Date: 20060802
Implantable Lead Implant Date: 20060802
Implantable Lead Implant Date: 20060802
Implantable Lead Location: 753858
Implantable Lead Location: 753859
Implantable Lead Location: 753860
Implantable Lead Model: 7001
Lead Channel Impedance Value: 450 Ohm
Lead Channel Impedance Value: 450 Ohm
Lead Channel Impedance Value: 525 Ohm
Lead Channel Pacing Threshold Amplitude: 0.75 V
Lead Channel Pacing Threshold Amplitude: 0.75 V
Lead Channel Pacing Threshold Amplitude: 0.75 V
Lead Channel Pacing Threshold Amplitude: 0.75 V
Lead Channel Pacing Threshold Pulse Width: 0.5 ms
Lead Channel Pacing Threshold Pulse Width: 0.5 ms
Lead Channel Pacing Threshold Pulse Width: 0.8 ms
Lead Channel Pacing Threshold Pulse Width: 0.8 ms
Lead Channel Sensing Intrinsic Amplitude: 1.2 mV
Lead Channel Sensing Intrinsic Amplitude: 12 mV
Lead Channel Setting Pacing Amplitude: 2 V
Lead Channel Setting Pacing Amplitude: 2.5 V
Lead Channel Setting Pacing Amplitude: 2.5 V
Lead Channel Setting Pacing Pulse Width: 0.5 ms
Lead Channel Setting Pacing Pulse Width: 0.8 ms
Lead Channel Setting Sensing Sensitivity: 0.5 mV
Pulse Gen Serial Number: 623851

## 2015-05-11 NOTE — Assessment & Plan Note (Signed)
His ventricular rate is well controlled. No change in meds. 

## 2015-05-11 NOTE — Assessment & Plan Note (Signed)
His [employer] BiV ICD is working normally. Will recheck in several months.  

## 2015-05-11 NOTE — Progress Notes (Signed)
HPI [name] returns today for followup. He is a very pleasant 82 year old man with a history of chronic atrial fibrillation, complete heart block, chronic systolic heart failure, status post biventricular ICD implantation. He has underlying nonischemic cardiomyopathy. His heart failure symptoms have been class II. He denies chest pain or shortness of breath. No dietary indiscretion. No syncope. He remains active working in his garden.  No Known Allergies   Current Outpatient Prescriptions  Medication Sig Dispense Refill  . apixaban (ELIQUIS) 5 MG TABS tablet Take 1 tablet (5 mg total) by mouth every 12 (twelve) hours. 60 tablet 9  . carvedilol (COREG) 6.25 MG tablet Take 1 tablet (6.25 mg total) by mouth 2 (two) times daily with a meal. 180 tablet 3  . furosemide (LASIX) 40 MG tablet Take 1-2 tablets by mouth twice daily as directed    . niacin (NIASPAN) 500 MG CR tablet TAKE 1 TABLET BY MOUTH EVERY DAY 90 tablet 1  . potassium chloride SA (K-DUR,KLOR-CON) 20 MEQ tablet TAKE 2 TABLETS BY MOUTH TWICE DAILY 360 tablet 0  . Tamsulosin HCl (FLOMAX) 0.4 MG CAPS Take 0.4 mg by mouth at bedtime.      . valsartan (DIOVAN) 80 MG tablet Take 80 mg by mouth 2 (two) times daily.      No current facility-administered medications for this visit.     Past Medical History  Diagnosis Date  . Automatic implantable cardiac defibrillator in situ     BiV pacer  . NICM (nonischemic cardiomyopathy) ([place])     nicm ef 30-35%,nonob cad-cath 2006,echo 2/12:ef30-35%.mod lvh,inf hk,mild-mod lae and mild rae,pasp 35  . Systolic CHF ([place])   . Unspecified essential hypertension   . Atrial fibrillation ([place])   . S/P mitral valve repair     2006  . HTN (hypertension)   . HLD (hyperlipidemia)   . Coronary artery disease, non-occlusive     cath 2006 pLM 20%, pLAD 40%; mCFX 20%  . PFO (patent foramen ovale)     s/p repair 2006  . Chronic systolic CHF (congestive heart failure) ([place])   . ICD (implantable cardiac  defibrillator) in place     BIV ICD  . Pacemaker     BIV ICD  . Shortness of breath   . Hernia, ventral     ROS:   All systems reviewed and negative except as noted in the HPI.   Past Surgical History  Procedure Laterality Date  . Rt. shoulder    . Bilateral knee arthroscopy    . Colonoscopy with polypectomy    . Intraoperative transesophageal echocardiogram    . Median sterotomy for mitral valve repair    . Implantation of a dual chamber biventricular icd    . Hip pinning  3/2/012     Family History  Problem Relation Age of Onset  . Heart disease Neg Hx     Rheumatic or valvular heart disease   . Cardiomyopathy Neg Hx      Social History   Social History  . Marital Status: Married    Spouse Name: N/A  . Number of Children: 6  . Years of Education: N/A   Occupational History  . 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 Smoking status: Former Smoker -- 1.00 packs/day for 5 years    Quit date: 05/06/1961  . Smokeless tobacco: Former Systems developer    Quit date: 05/07/1951  .  Alcohol Use: No  . Drug Use: No  . Sexual Activity: Not Currently   Other Topics Concern  . Not on file   Social History Narrative   Lives with his wife in [place].   Has 6 grown children.   Remains quite active.   Blood pressure 122/78, pulse 64, respirations 18  Physical Exam:  Well appearing 82 yo woman, NAD HEENT: Unremarkable Neck:  7 cm JVD, no thyromegally Lungs:  Clear with no wheezes, rales, or rhonchi. HEART:  Regular rate rhythm, no murmurs, no rubs, no clicks Abd:  soft, positive bowel sounds, no organomegally, no rebound, no guarding Ext:  2 plus pulses, no edema, no cyanosis, no clubbing Skin:  No rashes no nodules Neuro:  CN II through XII intact, motor grossly intact  DEVICE  Normal device function.  See PaceArt for details.   ECG - atrial fib with biv pacing  Assess/Plan:

## 2015-05-11 NOTE — Patient Instructions (Signed)
Medication Instructions:  Your physician recommends that you continue on your current medications as directed. Please refer to the Current Medication list given to you today.   Labwork: None ordered   Testing/Procedures: None ordered   Follow-Up: Remote monitoring is used to monitor your ICD from home. This monitoring reduces the number of office visits required to check your device to one time per year. It allows [place] to keep an eye on the functioning of your device to ensure it is working properly. You are scheduled for a device check from home on Your physician wants you to follow-up in: 08/10/15 You will receive a reminder letter in the mail two months in advance. If you don't receive a letter, please call our office to schedule the follow-up appointment.   Your physician wants you to follow-up in: 12 months with Dr Knox Saliva will receive a reminder letter in the mail two months in advance. If you don't receive a letter, please call our office to schedule the follow-up appointment.     Any Other Special Instructions Will Be Listed Below (If Applicable).     If you need a refill on your cardiac medications before your next appointment, please call your pharmacy.

## 2015-05-11 NOTE — Assessment & Plan Note (Signed)
His CHF is well compensated. He will continue his current meds.

## 2015-05-11 NOTE — Assessment & Plan Note (Signed)
His blood pressure is well controlled. No change in meds.  

## 2015-06-09 ENCOUNTER — Other Ambulatory Visit: Payer: Self-pay | Admitting: Internal Medicine

## 2015-07-10 ENCOUNTER — Ambulatory Visit: Payer: Medicare Other

## 2015-08-08 ENCOUNTER — Other Ambulatory Visit: Payer: Self-pay | Admitting: Internal Medicine

## 2015-08-10 ENCOUNTER — Ambulatory Visit (INDEPENDENT_AMBULATORY_CARE_PROVIDER_SITE_OTHER): Payer: Medicare Other | Admitting: *Deleted

## 2015-08-10 DIAGNOSIS — I428 Other cardiomyopathies: Secondary | ICD-10-CM

## 2015-08-10 DIAGNOSIS — Z95810 Presence of automatic (implantable) cardiac defibrillator: Secondary | ICD-10-CM

## 2015-08-10 DIAGNOSIS — I5023 Acute on chronic systolic (congestive) heart failure: Secondary | ICD-10-CM | POA: Diagnosis not present

## 2015-08-10 DIAGNOSIS — I429 Cardiomyopathy, unspecified: Secondary | ICD-10-CM

## 2015-08-10 NOTE — Progress Notes (Signed)
Remote ICD transmission.   

## 2015-08-30 ENCOUNTER — Other Ambulatory Visit: Payer: Self-pay | Admitting: Internal Medicine

## 2015-09-05 DIAGNOSIS — C44319 Basal cell carcinoma of skin of other parts of face: Secondary | ICD-10-CM | POA: Diagnosis not present

## 2015-09-11 DIAGNOSIS — Z125 Encounter for screening for malignant neoplasm of prostate: Secondary | ICD-10-CM | POA: Diagnosis not present

## 2015-09-11 DIAGNOSIS — I10 Essential (primary) hypertension: Secondary | ICD-10-CM | POA: Diagnosis not present

## 2015-09-11 DIAGNOSIS — E119 Type 2 diabetes mellitus without complications: Secondary | ICD-10-CM | POA: Diagnosis not present

## 2015-09-18 DIAGNOSIS — E1129 Type 2 diabetes mellitus with other diabetic kidney complication: Secondary | ICD-10-CM | POA: Diagnosis not present

## 2015-09-18 DIAGNOSIS — Z1389 Encounter for screening for other disorder: Secondary | ICD-10-CM | POA: Diagnosis not present

## 2015-09-18 DIAGNOSIS — J439 Emphysema, unspecified: Secondary | ICD-10-CM | POA: Diagnosis not present

## 2015-09-18 DIAGNOSIS — I480 Paroxysmal atrial fibrillation: Secondary | ICD-10-CM | POA: Diagnosis not present

## 2015-09-18 DIAGNOSIS — N183 Chronic kidney disease, stage 3 (moderate): Secondary | ICD-10-CM | POA: Diagnosis not present

## 2015-09-18 DIAGNOSIS — N401 Enlarged prostate with lower urinary tract symptoms: Secondary | ICD-10-CM | POA: Diagnosis not present

## 2015-09-18 DIAGNOSIS — I130 Hypertensive heart and chronic kidney disease with heart failure and stage 1 through stage 4 chronic kidney disease, or unspecified chronic kidney disease: Secondary | ICD-10-CM | POA: Diagnosis not present

## 2015-09-18 DIAGNOSIS — Z95810 Presence of automatic (implantable) cardiac defibrillator: Secondary | ICD-10-CM | POA: Diagnosis not present

## 2015-09-18 DIAGNOSIS — Z6826 Body mass index (BMI) 26.0-26.9, adult: Secondary | ICD-10-CM | POA: Diagnosis not present

## 2015-09-18 DIAGNOSIS — Z0000 Encounter for general adult medical examination without abnormal findings: Secondary | ICD-10-CM | POA: Diagnosis not present

## 2015-09-18 DIAGNOSIS — I10 Essential (primary) hypertension: Secondary | ICD-10-CM | POA: Diagnosis not present

## 2015-09-18 DIAGNOSIS — J811 Chronic pulmonary edema: Secondary | ICD-10-CM | POA: Diagnosis not present

## 2015-09-28 LAB — CUP PACEART REMOTE DEVICE CHECK
Battery Remaining Longevity: 19 mo
Battery Remaining Percentage: 25 %
Brady Statistic AP VP Percent: 1 %
Brady Statistic AP VS Percent: 2 %
Brady Statistic AS VS Percent: 12 %
Brady Statistic RA Percent Paced: 1 %
HighPow Impedance: 44 Ohm
Implantable Lead Implant Date: 20060802
Implantable Lead Location: 753858
Implantable Lead Location: 753859
Implantable Lead Model: 7001
Lead Channel Impedance Value: 410 Ohm
Lead Channel Impedance Value: 430 Ohm
Lead Channel Impedance Value: 460 Ohm
Lead Channel Pacing Threshold Pulse Width: 0.5 ms
Lead Channel Sensing Intrinsic Amplitude: 1.2 mV
Lead Channel Sensing Intrinsic Amplitude: 12 mV
Lead Channel Setting Pacing Amplitude: 2 V
Lead Channel Setting Pacing Amplitude: 2.5 V
Lead Channel Setting Pacing Amplitude: 2.5 V
Lead Channel Setting Pacing Pulse Width: 0.5 ms
MDC IDC LEAD IMPLANT DT: 20060802
MDC IDC LEAD IMPLANT DT: 20060802
MDC IDC LEAD LOCATION: 753860
MDC IDC MSMT BATTERY VOLTAGE: 2.81 V
MDC IDC MSMT LEADCHNL LV PACING THRESHOLD AMPLITUDE: 0.75 V
MDC IDC MSMT LEADCHNL LV PACING THRESHOLD PULSEWIDTH: 0.8 ms
MDC IDC MSMT LEADCHNL RV PACING THRESHOLD AMPLITUDE: 0.75 V
MDC IDC PG SERIAL: 623851
MDC IDC SESS DTM: 20170406060917
MDC IDC SET LEADCHNL LV PACING PULSEWIDTH: 0.8 ms
MDC IDC SET LEADCHNL RV SENSING SENSITIVITY: 0.5 mV
MDC IDC STAT BRADY AS VP PERCENT: 88 %

## 2015-09-29 ENCOUNTER — Encounter: Payer: Self-pay | Admitting: Cardiology

## 2015-10-31 DIAGNOSIS — Z85828 Personal history of other malignant neoplasm of skin: Secondary | ICD-10-CM | POA: Diagnosis not present

## 2015-10-31 DIAGNOSIS — L570 Actinic keratosis: Secondary | ICD-10-CM | POA: Diagnosis not present

## 2015-10-31 DIAGNOSIS — L820 Inflamed seborrheic keratosis: Secondary | ICD-10-CM | POA: Diagnosis not present

## 2015-11-09 ENCOUNTER — Telehealth: Payer: Self-pay | Admitting: Cardiology

## 2015-11-09 ENCOUNTER — Encounter: Payer: Medicare Other | Admitting: *Deleted

## 2015-11-09 NOTE — Telephone Encounter (Signed)
Spoke with pt and reminded pt of remote transmission that is due today. Pt verbalized understanding.   

## 2015-11-10 ENCOUNTER — Encounter: Payer: Self-pay | Admitting: Cardiology

## 2015-11-27 ENCOUNTER — Other Ambulatory Visit: Payer: Self-pay | Admitting: Internal Medicine

## 2015-12-22 DIAGNOSIS — M1711 Unilateral primary osteoarthritis, right knee: Secondary | ICD-10-CM | POA: Diagnosis not present

## 2016-01-01 ENCOUNTER — Telehealth: Payer: Self-pay | Admitting: Internal Medicine

## 2016-01-01 ENCOUNTER — Encounter: Payer: Self-pay | Admitting: Internal Medicine

## 2016-01-01 DIAGNOSIS — Z6826 Body mass index (BMI) 26.0-26.9, adult: Secondary | ICD-10-CM | POA: Diagnosis not present

## 2016-01-01 DIAGNOSIS — J439 Emphysema, unspecified: Secondary | ICD-10-CM | POA: Diagnosis not present

## 2016-01-01 DIAGNOSIS — I130 Hypertensive heart and chronic kidney disease with heart failure and stage 1 through stage 4 chronic kidney disease, or unspecified chronic kidney disease: Secondary | ICD-10-CM | POA: Diagnosis not present

## 2016-01-01 DIAGNOSIS — R5381 Other malaise: Secondary | ICD-10-CM | POA: Diagnosis not present

## 2016-01-01 DIAGNOSIS — R0602 Shortness of breath: Secondary | ICD-10-CM | POA: Diagnosis not present

## 2016-01-01 DIAGNOSIS — I509 Heart failure, unspecified: Secondary | ICD-10-CM | POA: Diagnosis not present

## 2016-01-01 NOTE — Telephone Encounter (Signed)
Spoke with patient's daughter and let her know that if he was not SOB at rest and feeling okay he should be okay to wait until tomorrow.  He was seen by a PA today and a CXR was done, she was told to follow up with his cardiologist.  I let her know that usually when a patient is acute the will call and speak with the MD on call for the office of advise to go to the ER.  He seems to be comfortable and not acute with his breathing.  She will bring him in to the office tomorrow

## 2016-01-01 NOTE — Telephone Encounter (Signed)
New message  Pt daughter call requesting to speak with RN. Pt daughter states pt went to PCP appt and received an xray. Pt daughter states pts PCP instructed he make an appt as soon as possible for finding more pulmonary congestion. An appt was make for 8/29 but daughter wants to know if something was avail for today. Please call back to discuss

## 2016-01-02 ENCOUNTER — Ambulatory Visit (INDEPENDENT_AMBULATORY_CARE_PROVIDER_SITE_OTHER): Payer: Medicare Other | Admitting: Internal Medicine

## 2016-01-02 ENCOUNTER — Encounter: Payer: Self-pay | Admitting: Internal Medicine

## 2016-01-02 VITALS — BP 100/62 | HR 59 | Ht 65.0 in | Wt 160.2 lb

## 2016-01-02 DIAGNOSIS — I429 Cardiomyopathy, unspecified: Secondary | ICD-10-CM

## 2016-01-02 DIAGNOSIS — I428 Other cardiomyopathies: Secondary | ICD-10-CM

## 2016-01-02 LAB — CUP PACEART INCLINIC DEVICE CHECK
Date Time Interrogation Session: 20170829112041
Implantable Lead Implant Date: 20060802
Implantable Lead Location: 753858
Implantable Lead Location: 753859
Implantable Lead Location: 753860
Lead Channel Pacing Threshold Amplitude: 0.75 V
Lead Channel Sensing Intrinsic Amplitude: 1.8 mV
Lead Channel Sensing Intrinsic Amplitude: 12 mV
MDC IDC LEAD IMPLANT DT: 20060802
MDC IDC LEAD IMPLANT DT: 20060802
MDC IDC LEAD MODEL: 7001
MDC IDC MSMT LEADCHNL LV PACING THRESHOLD PULSEWIDTH: 0.8 ms
MDC IDC MSMT LEADCHNL RV PACING THRESHOLD AMPLITUDE: 0.75 V
MDC IDC MSMT LEADCHNL RV PACING THRESHOLD PULSEWIDTH: 0.5 ms
Pulse Gen Serial Number: 623851

## 2016-01-02 NOTE — Patient Instructions (Signed)
Medication Instructions:  Your physician has recommended you make the following change in your medication:  1) Reduce Furosemide for 3 days to 1 tablet twice daily then go back to 2 tablets in the morning and 1 tablet in the afternoon     Labwork: None ordered   Testing/Procedures: None ordered   Follow-Up: Your physician recommends that you schedule a follow-up appointment in January 2018 with Dr Lovena Le  Remote monitoring is used to monitor your ICD from home. This monitoring reduces the number of office visits required to check your device to one time per year. It allows [place] to keep an eye on the functioning of your device to ensure it is working properly. You are scheduled for a device check from home on 04/02/16. You may send your transmission at any time that day. If you have a wireless device, the transmission will be sent automatically. After your physician reviews your transmission, you will receive a postcard with your next transmission date.     Any Other Special Instructions Will Be Listed Below (If Applicable).     If you need a refill on your cardiac medications before your next appointment, please call your pharmacy.

## 2016-01-02 NOTE — Progress Notes (Signed)
HPI Arthur Armstrong returns today for followup. He is a very pleasant 82 year old man with a history of chronic atrial fibrillation, complete heart block, chronic systolic heart failure, status post biventricular ICD implantation. He has underlying nonischemic cardiomyopathy. His heart failure symptoms have been class II. He has had some arthritic problems and received prednisone injections 2 weeks ago. He was immobile for about a week and now notes fatigue, weakness and a lack of energy. He was seen in primary MD office yesterday. Labs were ok. His CXR was interpreted as no change from prior. He denies sob to me. His weight is not up. No edema. No ICD shock. No Known Allergies   Current Outpatient Prescriptions  Medication Sig Dispense Refill  . carvedilol (COREG) 6.25 MG tablet TAKE 1 TABLET(6.25 MG) BY MOUTH TWICE DAILY WITH A MEAL 180 tablet 1  . ELIQUIS 5 MG TABS tablet TAKE 1 TABLET(5 MG) BY MOUTH EVERY 12 HOURS 60 tablet 8  . furosemide (LASIX) 40 MG tablet Take 1-2 tablets by mouth twice daily as directed    . niacin (NIASPAN) 500 MG CR tablet TAKE 1 TABLET BY MOUTH EVERY DAY 90 tablet 3  . potassium chloride SA (K-DUR,KLOR-CON) 20 MEQ tablet Take 2 tablets (40 mEq) in the mornings and 1 tablet (20 mEq) in the evenings    . Tamsulosin HCl (FLOMAX) 0.4 MG CAPS Take 0.4 mg by mouth at bedtime.      . valsartan (DIOVAN) 80 MG tablet Take 80 mg by mouth 2 (two) times daily.      No current facility-administered medications for this visit.      Past Medical History:  Diagnosis Date  . Atrial fibrillation ([place])   . Automatic implantable cardiac defibrillator in situ    BiV pacer  . Chronic systolic CHF (congestive heart failure) ([place])   . Coronary artery disease, non-occlusive    cath 2006 pLM 20%, pLAD 40%; mCFX 20%  . Hernia, ventral   . HLD (hyperlipidemia)   . HTN (hypertension)   . ICD (implantable cardiac defibrillator) in place    BIV ICD  . NICM (nonischemic cardiomyopathy) ([place])     nicm ef 30-35%,nonob cad-cath 2006,echo 2/12:ef30-35%.mod lvh,inf hk,mild-mod lae and mild rae,pasp 35  . Pacemaker    BIV ICD  . PFO (patent foramen ovale)    s/p repair 2006  . S/P mitral valve repair    2006  . Shortness of breath   . Systolic CHF ([place])   . Unspecified essential hypertension     ROS:   All systems reviewed and negative except as noted in the HPI.   Past Surgical History:  Procedure Laterality Date  . BILATERAL KNEE ARTHROSCOPY    . COLONOSCOPY WITH POLYPECTOMY    . HIP PINNING  3/2/012  . IMPLANTATION OF A DUAL CHAMBER BIVENTRICULAR ICD    . INTRAOPERATIVE TRANSESOPHAGEAL ECHOCARDIOGRAM    . MEDIAN STEROTOMY FOR MITRAL VALVE REPAIR    . RT. SHOULDER       Family History  Problem Relation Age of Onset  . Heart disease Neg Hx     Rheumatic or valvular heart disease   . Cardiomyopathy Neg Hx      Social History   Social History  . Marital status: Married    Spouse name: N/A  . Number of children: 6  . Years of education: N/A   Occupational History  . 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 [employer] in his church  Social History Main Topics  . Smoking status: Former Smoker    Packs/day: 1.00    Years: 5.00    Quit date: 05/06/1961  . Smokeless tobacco: Former Systems developer    Quit date: 05/07/1951  . Alcohol use No  . Drug use: No  . Sexual activity: Not Currently   Other Topics Concern  . Not on file   Social History Narrative   Lives with his wife in [place].   Has 6 grown children.   Remains quite active.   Blood pressure 122/78, pulse 64, respirations 18  Physical Exam:  Well appearing 82 yo woman, NAD HEENT: Unremarkable Neck:  6 cm JVD, no thyromegally Lungs:  Clear with no wheezes, rales, or rhonchi. HEART:  Regular rate rhythm, no murmurs, no rubs, no clicks Abd:  soft, positive bowel sounds, no organomegally, no rebound, no guarding Ext:  2 plus pulses, no edema, no cyanosis, no clubbing Skin:  No rashes  no nodules Neuro:  CN II through XII intact, motor grossly intact  DEVICE  Normal device function.  See PaceArt for details. His fluid index is low   Assess/Plan: 1. Chronic systolic heart failure - if anything, his fluid index is low. I suspect his symptoms are multifactorial. However, I think he is a little dry. I have asked him to reduce his lasix to 40 bid until his weight goes up 3-4 lbs. Then go back to 80 in a.m. And 40 in p.m. I have encouraged the patient to reduce his salt intake.  2. ICD - his [employer]. Jude BiV ICD is working normally. 3. Chronic atrial fib - his rate is well controlled. Will follow. 4. VT - he has had no recurrent VT on ICD. Will continue his current meds.

## 2016-01-03 DIAGNOSIS — M7552 Bursitis of left shoulder: Secondary | ICD-10-CM | POA: Diagnosis not present

## 2016-01-30 DIAGNOSIS — Z85828 Personal history of other malignant neoplasm of skin: Secondary | ICD-10-CM | POA: Diagnosis not present

## 2016-03-12 DIAGNOSIS — Z9981 Dependence on supplemental oxygen: Secondary | ICD-10-CM | POA: Diagnosis not present

## 2016-03-12 DIAGNOSIS — J439 Emphysema, unspecified: Secondary | ICD-10-CM | POA: Diagnosis not present

## 2016-03-12 DIAGNOSIS — I10 Essential (primary) hypertension: Secondary | ICD-10-CM | POA: Diagnosis not present

## 2016-03-12 DIAGNOSIS — I480 Paroxysmal atrial fibrillation: Secondary | ICD-10-CM | POA: Diagnosis not present

## 2016-03-12 DIAGNOSIS — E1129 Type 2 diabetes mellitus with other diabetic kidney complication: Secondary | ICD-10-CM | POA: Diagnosis not present

## 2016-03-12 DIAGNOSIS — N183 Chronic kidney disease, stage 3 (moderate): Secondary | ICD-10-CM | POA: Diagnosis not present

## 2016-03-12 DIAGNOSIS — I509 Heart failure, unspecified: Secondary | ICD-10-CM | POA: Diagnosis not present

## 2016-03-12 DIAGNOSIS — N401 Enlarged prostate with lower urinary tract symptoms: Secondary | ICD-10-CM | POA: Diagnosis not present

## 2016-03-12 DIAGNOSIS — Z95810 Presence of automatic (implantable) cardiac defibrillator: Secondary | ICD-10-CM | POA: Diagnosis not present

## 2016-03-12 DIAGNOSIS — I420 Dilated cardiomyopathy: Secondary | ICD-10-CM | POA: Diagnosis not present

## 2016-03-12 DIAGNOSIS — Z6827 Body mass index (BMI) 27.0-27.9, adult: Secondary | ICD-10-CM | POA: Diagnosis not present

## 2016-03-12 DIAGNOSIS — Z23 Encounter for immunization: Secondary | ICD-10-CM | POA: Diagnosis not present

## 2016-03-26 DIAGNOSIS — S0083XA Contusion of other part of head, initial encounter: Secondary | ICD-10-CM | POA: Diagnosis not present

## 2016-03-26 DIAGNOSIS — S0990XA Unspecified injury of head, initial encounter: Secondary | ICD-10-CM | POA: Diagnosis not present

## 2016-03-26 DIAGNOSIS — S098XXA Other specified injuries of head, initial encounter: Secondary | ICD-10-CM | POA: Diagnosis not present

## 2016-03-26 DIAGNOSIS — S0181XA Laceration without foreign body of other part of head, initial encounter: Secondary | ICD-10-CM | POA: Diagnosis not present

## 2016-03-26 DIAGNOSIS — S199XXA Unspecified injury of neck, initial encounter: Secondary | ICD-10-CM | POA: Diagnosis not present

## 2016-04-02 ENCOUNTER — Ambulatory Visit (INDEPENDENT_AMBULATORY_CARE_PROVIDER_SITE_OTHER): Payer: Medicare Other | Admitting: *Deleted

## 2016-04-02 DIAGNOSIS — I428 Other cardiomyopathies: Secondary | ICD-10-CM | POA: Diagnosis not present

## 2016-04-02 DIAGNOSIS — I480 Paroxysmal atrial fibrillation: Secondary | ICD-10-CM | POA: Diagnosis not present

## 2016-04-02 DIAGNOSIS — I509 Heart failure, unspecified: Secondary | ICD-10-CM | POA: Diagnosis not present

## 2016-04-02 DIAGNOSIS — Z4802 Encounter for removal of sutures: Secondary | ICD-10-CM | POA: Diagnosis not present

## 2016-04-02 DIAGNOSIS — Z5189 Encounter for other specified aftercare: Secondary | ICD-10-CM | POA: Diagnosis not present

## 2016-04-02 DIAGNOSIS — Z9981 Dependence on supplemental oxygen: Secondary | ICD-10-CM | POA: Diagnosis not present

## 2016-04-02 DIAGNOSIS — E1129 Type 2 diabetes mellitus with other diabetic kidney complication: Secondary | ICD-10-CM | POA: Diagnosis not present

## 2016-04-02 DIAGNOSIS — J439 Emphysema, unspecified: Secondary | ICD-10-CM | POA: Diagnosis not present

## 2016-04-02 DIAGNOSIS — Z6827 Body mass index (BMI) 27.0-27.9, adult: Secondary | ICD-10-CM | POA: Diagnosis not present

## 2016-04-02 DIAGNOSIS — I130 Hypertensive heart and chronic kidney disease with heart failure and stage 1 through stage 4 chronic kidney disease, or unspecified chronic kidney disease: Secondary | ICD-10-CM | POA: Diagnosis not present

## 2016-04-03 NOTE — Progress Notes (Signed)
Remote ICD transmission.   

## 2016-04-04 ENCOUNTER — Encounter: Payer: Self-pay | Admitting: Cardiology

## 2016-04-05 DIAGNOSIS — L570 Actinic keratosis: Secondary | ICD-10-CM | POA: Diagnosis not present

## 2016-04-05 DIAGNOSIS — L820 Inflamed seborrheic keratosis: Secondary | ICD-10-CM | POA: Diagnosis not present

## 2016-04-05 DIAGNOSIS — L821 Other seborrheic keratosis: Secondary | ICD-10-CM | POA: Diagnosis not present

## 2016-05-09 LAB — CUP PACEART REMOTE DEVICE CHECK
Battery Voltage: 2.72 V
Brady Statistic AP VP Percent: 1 %
Brady Statistic AP VS Percent: 2 %
Brady Statistic AS VP Percent: 92 %
HIGH POWER IMPEDANCE MEASURED VALUE: 44 Ohm
Implantable Lead Implant Date: 20060802
Implantable Lead Location: 753858
Implantable Lead Model: 7001
Lead Channel Impedance Value: 510 Ohm
Lead Channel Pacing Threshold Amplitude: 0.75 V
Lead Channel Pacing Threshold Pulse Width: 0.5 ms
Lead Channel Pacing Threshold Pulse Width: 0.8 ms
Lead Channel Sensing Intrinsic Amplitude: 12 mV
Lead Channel Setting Pacing Amplitude: 2 V
Lead Channel Setting Pacing Pulse Width: 0.8 ms
MDC IDC LEAD IMPLANT DT: 20060802
MDC IDC LEAD IMPLANT DT: 20060802
MDC IDC LEAD LOCATION: 753859
MDC IDC LEAD LOCATION: 753860
MDC IDC MSMT BATTERY REMAINING LONGEVITY: 12 mo
MDC IDC MSMT BATTERY REMAINING PERCENTAGE: 14 %
MDC IDC MSMT LEADCHNL LV PACING THRESHOLD AMPLITUDE: 0.75 V
MDC IDC MSMT LEADCHNL RA IMPEDANCE VALUE: 430 Ohm
MDC IDC MSMT LEADCHNL RA SENSING INTR AMPL: 1.8 mV
MDC IDC MSMT LEADCHNL RV IMPEDANCE VALUE: 400 Ohm
MDC IDC PG IMPLANT DT: 20110926
MDC IDC PG SERIAL: 623851
MDC IDC SESS DTM: 20171128090010
MDC IDC SET LEADCHNL LV PACING AMPLITUDE: 2.5 V
MDC IDC SET LEADCHNL RV PACING AMPLITUDE: 2.5 V
MDC IDC SET LEADCHNL RV PACING PULSEWIDTH: 0.5 ms
MDC IDC SET LEADCHNL RV SENSING SENSITIVITY: 0.5 mV
MDC IDC STAT BRADY AS VS PERCENT: 7.6 %
MDC IDC STAT BRADY RA PERCENT PACED: 1 %

## 2016-05-10 ENCOUNTER — Encounter: Payer: Self-pay | Admitting: *Deleted

## 2016-05-15 DIAGNOSIS — R05 Cough: Secondary | ICD-10-CM | POA: Diagnosis not present

## 2016-05-15 DIAGNOSIS — I509 Heart failure, unspecified: Secondary | ICD-10-CM | POA: Diagnosis not present

## 2016-05-15 DIAGNOSIS — Z6827 Body mass index (BMI) 27.0-27.9, adult: Secondary | ICD-10-CM | POA: Diagnosis not present

## 2016-05-15 DIAGNOSIS — J309 Allergic rhinitis, unspecified: Secondary | ICD-10-CM | POA: Diagnosis not present

## 2016-05-17 DIAGNOSIS — I10 Essential (primary) hypertension: Secondary | ICD-10-CM | POA: Diagnosis not present

## 2016-05-17 DIAGNOSIS — N183 Chronic kidney disease, stage 3 (moderate): Secondary | ICD-10-CM | POA: Diagnosis not present

## 2016-05-17 DIAGNOSIS — Z6827 Body mass index (BMI) 27.0-27.9, adult: Secondary | ICD-10-CM | POA: Diagnosis not present

## 2016-05-17 DIAGNOSIS — J181 Lobar pneumonia, unspecified organism: Secondary | ICD-10-CM | POA: Diagnosis not present

## 2016-05-17 DIAGNOSIS — I959 Hypotension, unspecified: Secondary | ICD-10-CM | POA: Diagnosis not present

## 2016-05-17 DIAGNOSIS — I509 Heart failure, unspecified: Secondary | ICD-10-CM | POA: Diagnosis not present

## 2016-05-18 ENCOUNTER — Inpatient Hospital Stay (HOSPITAL_COMMUNITY)
Admission: EM | Admit: 2016-05-18 | Discharge: 2016-05-21 | DRG: 202 | Disposition: A | Discharge status: Skilled Nursing Facility | Payer: Medicare Other | Attending: Internal Medicine | Admitting: Internal Medicine

## 2016-05-18 ENCOUNTER — Encounter (HOSPITAL_COMMUNITY): Payer: Self-pay | Admitting: Emergency Medicine

## 2016-05-18 ENCOUNTER — Emergency Department (HOSPITAL_COMMUNITY): Payer: Medicare Other

## 2016-05-18 DIAGNOSIS — J209 Acute bronchitis, unspecified: Secondary | ICD-10-CM

## 2016-05-18 DIAGNOSIS — D696 Thrombocytopenia, unspecified: Secondary | ICD-10-CM | POA: Diagnosis present

## 2016-05-18 DIAGNOSIS — R0600 Dyspnea, unspecified: Secondary | ICD-10-CM | POA: Diagnosis not present

## 2016-05-18 DIAGNOSIS — R278 Other lack of coordination: Secondary | ICD-10-CM | POA: Diagnosis not present

## 2016-05-18 DIAGNOSIS — N400 Enlarged prostate without lower urinary tract symptoms: Secondary | ICD-10-CM | POA: Diagnosis present

## 2016-05-18 DIAGNOSIS — Z79899 Other long term (current) drug therapy: Secondary | ICD-10-CM | POA: Diagnosis not present

## 2016-05-18 DIAGNOSIS — J9600 Acute respiratory failure, unspecified whether with hypoxia or hypercapnia: Secondary | ICD-10-CM | POA: Diagnosis not present

## 2016-05-18 DIAGNOSIS — Z7901 Long term (current) use of anticoagulants: Secondary | ICD-10-CM

## 2016-05-18 DIAGNOSIS — E119 Type 2 diabetes mellitus without complications: Secondary | ICD-10-CM

## 2016-05-18 DIAGNOSIS — E860 Dehydration: Secondary | ICD-10-CM | POA: Diagnosis present

## 2016-05-18 DIAGNOSIS — I248 Other forms of acute ischemic heart disease: Secondary | ICD-10-CM | POA: Diagnosis present

## 2016-05-18 DIAGNOSIS — T380X5A Adverse effect of glucocorticoids and synthetic analogues, initial encounter: Secondary | ICD-10-CM | POA: Diagnosis present

## 2016-05-18 DIAGNOSIS — Z7951 Long term (current) use of inhaled steroids: Secondary | ICD-10-CM

## 2016-05-18 DIAGNOSIS — I2510 Atherosclerotic heart disease of native coronary artery without angina pectoris: Secondary | ICD-10-CM | POA: Diagnosis present

## 2016-05-18 DIAGNOSIS — I130 Hypertensive heart and chronic kidney disease with heart failure and stage 1 through stage 4 chronic kidney disease, or unspecified chronic kidney disease: Secondary | ICD-10-CM | POA: Diagnosis present

## 2016-05-18 DIAGNOSIS — R0609 Other forms of dyspnea: Secondary | ICD-10-CM

## 2016-05-18 DIAGNOSIS — E1142 Type 2 diabetes mellitus with diabetic polyneuropathy: Secondary | ICD-10-CM | POA: Diagnosis present

## 2016-05-18 DIAGNOSIS — I4891 Unspecified atrial fibrillation: Secondary | ICD-10-CM | POA: Diagnosis present

## 2016-05-18 DIAGNOSIS — Z95810 Presence of automatic (implantable) cardiac defibrillator: Secondary | ICD-10-CM | POA: Diagnosis not present

## 2016-05-18 DIAGNOSIS — I50812 Chronic right heart failure: Secondary | ICD-10-CM

## 2016-05-18 DIAGNOSIS — J069 Acute upper respiratory infection, unspecified: Secondary | ICD-10-CM | POA: Diagnosis present

## 2016-05-18 DIAGNOSIS — M1710 Unilateral primary osteoarthritis, unspecified knee: Secondary | ICD-10-CM

## 2016-05-18 DIAGNOSIS — R2681 Unsteadiness on feet: Secondary | ICD-10-CM | POA: Diagnosis not present

## 2016-05-18 DIAGNOSIS — J208 Acute bronchitis due to other specified organisms: Secondary | ICD-10-CM | POA: Diagnosis not present

## 2016-05-18 DIAGNOSIS — M6281 Muscle weakness (generalized): Secondary | ICD-10-CM | POA: Diagnosis not present

## 2016-05-18 DIAGNOSIS — J9601 Acute respiratory failure with hypoxia: Secondary | ICD-10-CM | POA: Diagnosis present

## 2016-05-18 DIAGNOSIS — E785 Hyperlipidemia, unspecified: Secondary | ICD-10-CM | POA: Diagnosis present

## 2016-05-18 DIAGNOSIS — R05 Cough: Secondary | ICD-10-CM | POA: Diagnosis present

## 2016-05-18 DIAGNOSIS — N179 Acute kidney failure, unspecified: Secondary | ICD-10-CM | POA: Diagnosis not present

## 2016-05-18 DIAGNOSIS — R059 Cough, unspecified: Secondary | ICD-10-CM | POA: Diagnosis present

## 2016-05-18 DIAGNOSIS — I9589 Other hypotension: Secondary | ICD-10-CM | POA: Diagnosis not present

## 2016-05-18 DIAGNOSIS — M79673 Pain in unspecified foot: Secondary | ICD-10-CM | POA: Diagnosis present

## 2016-05-18 DIAGNOSIS — I10 Essential (primary) hypertension: Secondary | ICD-10-CM | POA: Diagnosis present

## 2016-05-18 DIAGNOSIS — I429 Cardiomyopathy, unspecified: Secondary | ICD-10-CM | POA: Diagnosis present

## 2016-05-18 DIAGNOSIS — I959 Hypotension, unspecified: Secondary | ICD-10-CM | POA: Diagnosis present

## 2016-05-18 DIAGNOSIS — N183 Chronic kidney disease, stage 3 unspecified: Secondary | ICD-10-CM | POA: Diagnosis present

## 2016-05-18 DIAGNOSIS — M170 Bilateral primary osteoarthritis of knee: Secondary | ICD-10-CM | POA: Diagnosis present

## 2016-05-18 DIAGNOSIS — R0602 Shortness of breath: Secondary | ICD-10-CM | POA: Diagnosis not present

## 2016-05-18 DIAGNOSIS — I5020 Unspecified systolic (congestive) heart failure: Secondary | ICD-10-CM | POA: Diagnosis not present

## 2016-05-18 DIAGNOSIS — D649 Anemia, unspecified: Secondary | ICD-10-CM | POA: Diagnosis present

## 2016-05-18 DIAGNOSIS — R2689 Other abnormalities of gait and mobility: Secondary | ICD-10-CM | POA: Diagnosis not present

## 2016-05-18 DIAGNOSIS — R789 Finding of unspecified substance, not normally found in blood: Secondary | ICD-10-CM | POA: Diagnosis not present

## 2016-05-18 DIAGNOSIS — R41841 Cognitive communication deficit: Secondary | ICD-10-CM | POA: Diagnosis not present

## 2016-05-18 DIAGNOSIS — E1122 Type 2 diabetes mellitus with diabetic chronic kidney disease: Secondary | ICD-10-CM | POA: Diagnosis present

## 2016-05-18 DIAGNOSIS — I5022 Chronic systolic (congestive) heart failure: Secondary | ICD-10-CM | POA: Diagnosis present

## 2016-05-18 DIAGNOSIS — Z87891 Personal history of nicotine dependence: Secondary | ICD-10-CM | POA: Diagnosis not present

## 2016-05-18 DIAGNOSIS — R0902 Hypoxemia: Secondary | ICD-10-CM | POA: Diagnosis not present

## 2016-05-18 DIAGNOSIS — R600 Localized edema: Secondary | ICD-10-CM

## 2016-05-18 DIAGNOSIS — R531 Weakness: Secondary | ICD-10-CM | POA: Diagnosis not present

## 2016-05-18 DIAGNOSIS — I482 Chronic atrial fibrillation: Secondary | ICD-10-CM | POA: Diagnosis not present

## 2016-05-18 DIAGNOSIS — Z9889 Other specified postprocedural states: Secondary | ICD-10-CM

## 2016-05-18 DIAGNOSIS — G629 Polyneuropathy, unspecified: Secondary | ICD-10-CM

## 2016-05-18 LAB — COMPREHENSIVE METABOLIC PANEL
ALBUMIN: 4 g/dL (ref 3.5–5.0)
ALT: 14 U/L — ABNORMAL LOW (ref 17–63)
ANION GAP: 13 (ref 5–15)
AST: 18 U/L (ref 15–41)
Alkaline Phosphatase: 97 U/L (ref 38–126)
BUN: 64 mg/dL — AB (ref 6–20)
CO2: 27 mmol/L (ref 22–32)
Calcium: 9.2 mg/dL (ref 8.9–10.3)
Chloride: 94 mmol/L — ABNORMAL LOW (ref 101–111)
Creatinine, Ser: 2.04 mg/dL — ABNORMAL HIGH (ref 0.61–1.24)
GFR calc Af Amer: 32 mL/min — ABNORMAL LOW (ref >60)
GFR calc non Af Amer: 28 mL/min — ABNORMAL LOW (ref >60)
GLUCOSE: 211 mg/dL — AB (ref 65–99)
POTASSIUM: 4.2 mmol/L (ref 3.5–5.1)
Sodium: 134 mmol/L — ABNORMAL LOW (ref 135–145)
Total Bilirubin: 1 mg/dL (ref 0.3–1.2)
Total Protein: 7.5 g/dL (ref 6.5–8.1)

## 2016-05-18 LAB — CBC
HCT: 41.5 % (ref 39.0–52.0)
HEMOGLOBIN: 13.4 g/dL (ref 13.0–17.0)
MCH: 30.5 pg (ref 26.0–34.0)
MCHC: 32.3 g/dL (ref 30.0–36.0)
MCV: 94.5 fL (ref 78.0–100.0)
Platelets: 156 10*3/uL (ref 150–400)
RBC: 4.39 MIL/uL (ref 4.22–5.81)
RDW: 13.7 % (ref 11.5–15.5)
WBC: 8.2 10*3/uL (ref 4.0–10.5)

## 2016-05-18 LAB — BRAIN NATRIURETIC PEPTIDE: B Natriuretic Peptide: 153.6 pg/mL — ABNORMAL HIGH (ref 0.0–100.0)

## 2016-05-18 MED ORDER — ALBUTEROL SULFATE (2.5 MG/3ML) 0.083% IN NEBU
5.0000 mg | INHALATION_SOLUTION | Freq: Once | RESPIRATORY_TRACT | Status: AC
  Administered 2016-05-18: 5 mg via RESPIRATORY_TRACT

## 2016-05-18 MED ORDER — OXYCODONE-ACETAMINOPHEN 5-325 MG PO TABS
1.0000 | ORAL_TABLET | Freq: Once | ORAL | Status: AC
  Administered 2016-05-18: 1 via ORAL
  Filled 2016-05-18: qty 1

## 2016-05-18 MED ORDER — ALBUTEROL SULFATE (2.5 MG/3ML) 0.083% IN NEBU
INHALATION_SOLUTION | RESPIRATORY_TRACT | Status: AC
  Filled 2016-05-18: qty 6

## 2016-05-18 MED ORDER — SODIUM CHLORIDE 0.9 % IV BOLUS (SEPSIS)
500.0000 mL | Freq: Once | INTRAVENOUS | Status: AC
  Administered 2016-05-18: 500 mL via INTRAVENOUS

## 2016-05-18 MED ORDER — SODIUM CHLORIDE 0.9 % IV BOLUS (SEPSIS)
1000.0000 mL | Freq: Once | INTRAVENOUS | Status: AC
  Administered 2016-05-18: 1000 mL via INTRAVENOUS

## 2016-05-18 NOTE — ED Triage Notes (Signed)
The patient's daughter said he started having sob, cough on May 08, 2016.  The daughter was trying to treat the cold with OTC medications but he was getting worse.  She took him to his PCP and they diagnosed hin with CHF on wednesday along with pneumonia.  He was given a zpack at the md's office.  He has taken all except one pill of the zpack.

## 2016-05-18 NOTE — H&P (Addendum)
History and Physical    ELLSWORTH CHARLEBOIS [id number] DOB: Jun 17, 1926 DOA: 05/18/2016  Referring MD/NP/PA:   PCP: Haywood Pao, MD   Patient coming from:  The patient is coming from home.  At baseline, pt is independent for most of ADL. SNF  Assistant living facility   Retirement center.       Chief Complaint:  cough, shortness of breath and leg pain  HPI: Arthur Armstrong is a 83 y.o. male with medical history significant of hypertension, hyperlipidemia, sCHF with EF 30-35%, s/o of mitral valve repair, PFO, pacemaker, ICD placement, atrial fibrillation on Eliquis, BPH, CKD-3, who presents with cough, shortness of breath and leg pain.  Patient states that he has been having cough, shortness rest for almost a 10 days. He denies chest pain, fever or chills. He has runny nose, no sore throat. He coughs up yellow colored sputum. He was seen by his PCP on Wednesday, and diagnosed as worsening CHF and pneumonia. Patient was given prescription for metolazone and Z-Pak. Patient states that his leg edema improved significantly, but continues to have cough and shortness of breath. Still no CP, fever or chills. Pt also reports severe pain in both feet and right leg since 2 days ago. No injury. No tingling or numbness. Patient does not have nausea, vomiting, diarrhea, abdominal pain, symptoms of UTI or unilateral weakness.   ED Course: pt was found to have  hypotension with blood pressure 88/67, which is responding to IV fluids treatment. WBC 22, BNP 153.6, worsening renal function, negative chest x-ray, temperature normal, tachycardia, O2 sat 96% on room air. Pt is admitted to SDU as inpt.  Review of Systems:   General: no fevers, chills, no changes in body weight, has poor appetite, has fatigue HEENT: no blurry vision, hearing changes or sore throat Respiratory: has dyspnea, coughing, no wheezing CV: no chest pain, no palpitations GI: no nausea, vomiting, abdominal pain, diarrhea, constipation GU:  no dysuria, burning on urination, increased urinary frequency, hematuria  Ext: no leg edema Neuro: no unilateral weakness, numbness, or tingling, no vision change or hearing loss Skin: no rash, no skin tear. MSK: has pain both feet and right leg. Heme: No easy bruising.  Travel history: No recent long distant travel.  Allergy: No Known Allergies  Past Medical History:  Diagnosis Date  . Atrial fibrillation ([place])   . Automatic implantable cardiac defibrillator in situ    BiV pacer  . Chronic systolic CHF (congestive heart failure) ([place])   . Coronary artery disease, non-occlusive    cath 2006 pLM 20%, pLAD 40%; mCFX 20%  . Hernia, ventral   . HLD (hyperlipidemia)   . HTN (hypertension)   . ICD (implantable cardiac defibrillator) in place    BIV ICD  . NICM (nonischemic cardiomyopathy) ([place])    nicm ef 30-35%,nonob cad-cath 2006,echo 2/12:ef30-35%.mod lvh,inf hk,mild-mod lae and mild rae,pasp 35  . Pacemaker    BIV ICD  . PFO (patent foramen ovale)    s/p repair 2006  . S/P mitral valve repair    2006  . Shortness of breath   . Systolic CHF ([place])   . Unspecified essential hypertension     Past Surgical History:  Procedure Laterality Date  . BILATERAL KNEE ARTHROSCOPY    . COLONOSCOPY WITH POLYPECTOMY    . HIP PINNING  3/2/012  . IMPLANTATION OF A DUAL CHAMBER BIVENTRICULAR ICD    . INTRAOPERATIVE TRANSESOPHAGEAL ECHOCARDIOGRAM    . MEDIAN STEROTOMY FOR MITRAL VALVE REPAIR    .  RT. SHOULDER      Social History:  reports that he quit smoking about 55 years ago. He has a 5.00 pack-year smoking history. He quit smokeless tobacco use about 65 years ago. He reports that he does not drink alcohol or use drugs.  Family History:  Family History  Problem Relation Age of Onset  . Heart attack Mother   . Heart attack Father   . Heart disease Neg Hx     Rheumatic or valvular heart disease   . Cardiomyopathy Neg Hx      Prior to Admission medications   Medication Sig Start  Date End Date Taking? Authorizing Provider  acetaminophen (TYLENOL) 500 MG tablet Take 500 mg by mouth every 6 (six) hours as needed.   Yes Historical Provider, MD  carvedilol (COREG) 6.25 MG tablet TAKE 1 TABLET(6.25 MG) BY MOUTH TWICE DAILY WITH A MEAL 11/28/15  Yes Evans Lance, MD  ELIQUIS 5 MG TABS tablet TAKE 1 TABLET(5 MG) BY MOUTH EVERY 12 HOURS 08/30/15  Yes Evans Lance, MD  fluticasone (VERAMYST) 27.5 MCG/SPRAY nasal spray Place 2 sprays into the nose daily.   Yes Historical Provider, MD  furosemide (LASIX) 40 MG tablet Take 40 mg by mouth See admin instructions. Take two in the morning then take 1 tablet by mouth in the afternoon   Yes Historical Provider, MD  Polyethyl Glycol-Propyl Glycol (SYSTANE OP) Place 1 drop into both eyes daily.   Yes Historical Provider, MD  potassium chloride SA (K-DUR,KLOR-CON) 20 MEQ tablet Take 2 tablets (40 mEq) in the mornings and 1 tablet (20 mEq) in the evenings   Yes Historical Provider, MD  Tamsulosin HCl (FLOMAX) 0.4 MG CAPS Take 0.4 mg by mouth at bedtime.     Yes Historical Provider, MD  niacin (NIASPAN) 500 MG CR tablet TAKE 1 TABLET BY MOUTH EVERY DAY Patient not taking: Reported on 05/18/2016 06/09/15   Evans Lance, MD    Physical Exam: Vitals:   05/18/16 2115 05/18/16 2215 05/18/16 2230 05/18/16 2345  BP: 110/67 101/69 113/67 95/67  Pulse: 64 60 (!) 59 65  Resp: 16 17 11 12   Temp:      TempSrc:      SpO2: 97% 100% 100% 98%  Weight:      Height:       General: Not in acute distress HEENT:       Eyes: PERRL, EOMI, no scleral icterus.       ENT: No discharge from the ears and nose, no pharynx injection, no tonsillar enlargement.        Neck: No JVD, no bruit, no mass felt. Heme: No neck lymph node enlargement. Cardiac: S1/S2, RRR, No murmurs, No gallops or rubs. Respiratory: has coarse breathing sounds. No rales, wheezing, rhonchi or rubs. GI: Soft, nondistended, nontender, no rebound pain, no organomegaly, BS present. GU: No  hematuria Ext: No pitting leg edema bilaterally. 2+DP/PT pulse bilaterally. Has severe pain in both feet and right lower leg, even not allowed to touch. Has minimal redness in medial side of right foot, no warmth. No joint effusion.  Musculoskeletal: No joint deformities, No joint redness or warmth, no limitation of ROM in spin. Skin: No rashes.  Neuro: Alert, oriented X3, cranial nerves II-XII grossly intact, moves all extremities normally.  Psych: Patient is not psychotic, no suicidal or hemocidal ideation.  Labs on Admission: I have personally reviewed following labs and imaging studies  CBC:  Recent Labs Lab 05/18/16 2024  WBC 8.2  HGB 13.4  HCT 41.5  MCV 94.5  PLT [id number]   Basic Metabolic Panel:  Recent Labs Lab 05/18/16 2024  NA 134*  K 4.2  CL 94*  CO2 27  GLUCOSE 211*  BUN 64*  CREATININE 2.04*  CALCIUM 9.2   GFR: Estimated Creatinine Clearance: 23 mL/min (by C-G formula based on SCr of 2.04 mg/dL (H)). Liver Function Tests:  Recent Labs Lab 05/18/16 2024  AST 18  ALT 14*  ALKPHOS 97  BILITOT 1.0  PROT 7.5  ALBUMIN 4.0   No results for input(s): LIPASE, AMYLASE in the last 168 hours. No results for input(s): AMMONIA in the last 168 hours. Coagulation Profile: No results for input(s): INR, PROTIME in the last 168 hours. Cardiac Enzymes: No results for input(s): CKTOTAL, CKMB, CKMBINDEX, TROPONINI in the last 168 hours. BNP (last 3 results) No results for input(s): PROBNP in the last 8760 hours. HbA1C: No results for input(s): HGBA1C in the last 72 hours. CBG: No results for input(s): GLUCAP in the last 168 hours. Lipid Profile: No results for input(s): CHOL, HDL, LDLCALC, TRIG, CHOLHDL, LDLDIRECT in the last 72 hours. Thyroid Function Tests: No results for input(s): TSH, T4TOTAL, FREET4, T3FREE, THYROIDAB in the last 72 hours. Anemia Panel: No results for input(s): VITAMINB12, FOLATE, FERRITIN, TIBC, IRON, RETICCTPCT in the last 72 hours. Urine  analysis:    Component Value Date/Time   COLORURINE YELLOW 02/27/2014 [place] 02/27/2014 1343   LABSPEC 1.009 02/27/2014 1343   PHURINE 5.5 02/27/2014 1343   GLUCOSEU NEGATIVE 02/27/2014 1343   HGBUR NEGATIVE 02/27/2014 1343   BILIRUBINUR NEGATIVE 02/27/2014 1343   KETONESUR NEGATIVE 02/27/2014 1343   PROTEINUR NEGATIVE 02/27/2014 1343   UROBILINOGEN 0.2 02/27/2014 1343   NITRITE NEGATIVE 02/27/2014 1343   LEUKOCYTESUR NEGATIVE 02/27/2014 1343   Sepsis Labs: @LABRCNTIP (procalcitonin:4,lacticidven:4) )No results found for this or any previous visit (from the past 240 hour(s)).   Radiological Exams on Admission: Dg Chest 2 View  Result Date: 05/18/2016 CLINICAL DATA:  Pneumonia. Recently diagnosed pneumonia and CHF. Cough for the past week. Shortness of breath. EXAM: CHEST  2 VIEW COMPARISON:  02/27/2014. FINDINGS: Stable enlarged cardiac silhouette, median sternotomy wires, prosthetic mitral valve and left subclavian pacer and AICD leads. Stable prominence of the interstitial markings. Otherwise, clear lungs. Diffuse osteopenia. Right shoulder degenerative changes thoracic spine degenerative changes. Aortic calcification. IMPRESSION: 1. No acute abnormality. 2. Stable cardiomegaly, chronic interstitial lung disease and aortic atherosclerosis. Electronically Signed   By: Claudie Revering M.D.   On: 05/18/2016 16:33     EKG: Independently reviewed.  Please rhythm, QTC 493   Assessment/Plan Principal Problem:   Dyspnea Active Problems:   Essential hypertension   Atrial fibrillation (H[facility])   Automatic implantable cardioverter-defibrillator in situ   S/P mitral valve repair   Chronic systolic CHF (congestive heart failure) (H[facility])   Acute renal failure superimposed on stage 3 chronic kidney disease (HCC)   Foot pain   Diabetes mellitus without complication (HCC)   Hypotension   Peripheral neuropathy ([facility])   Cough   Dyspnea and cough: Etiology is not clear. Patient  seems to be euvolemic on physical examination, less likely due to CHF exacerbation. He is on Eliquis and no chest pain or signs of DVT, unlikely to have PE. No infiltration on chest x-ray. Patient likely to have upper respiratory viral infection and viral bronchitis given coarse breathing sounds. Will also r/o silent ACS.  -will admit to SDU as inpt -Nebulizers: scheduled Duoneb and  prn albuterol -Mucinex for cough  -Follow up blood culture x2, respiratory virus panel, Flu pcr -trop x 3  Addendum: trop comes back positive, 0.03. Pt dose not have chest pain. Likely due to demand ischemia. -will start ASA -continue coreg -check A1c and FLP -2d echo.  Hypotension: Possibly due to dehydration secondary to recent escalation of diuretics treatment. Sepsis cannot be completely ruled out -IVF: 1.5 L NS, then 75 cc/h -will get Procalcitonin and trend lactic acid levels per sepsis protocol. -blood culture x 2 -check UA  HTN: -hold lasix due to hypoension -on Coreg for A fib, will put holding parameter  Atrial Fibrillation: CHA2DS2-VASc Score is 5, needs oral anticoagulation. Patient is on Eliquis at home. Heart rate is well controlled. -continue Eliquis and coreg  Chronic systolic CHF: Patient does not have leg edema  Or JVD. BNP 153. Clinically patient seems to be dry. -Hold Lasix due to hypotension and worsening renal function -Continue Coreg  DM-II: blood sugar 211. Patient does not carry diagnosis of diabetes. He is not aware of this diagnosis. Chart review showed that he had A1c 7.0 on 06/17/10. He meets criteria for diagnosis of diabetes. -Check A1c -SSI  AoCKD-III: Baseline Cre is 1.62 on 02/28/14, pt's Cre is 2.04, BUN 64 on admission. Likely due to prerenal secondary to dehydration and continuation of diruetics - IVF as above - Check FeUrea - Follow up renal function by BMP - Hold Diuretics  Peripheral neuropathy: Patient has a severe pain in both feet and left lower leg. Does  not seem to have a cellulitis on examination. Possibly due to diabetic peripheral neuropathy given his untreated DM.  -start Neurontin 100 mg 3 times a day -When necessary Percocet -Check vitamin B12 level and TSH   DVT ppx: on Eliquis Code Status: Full code Family Communication: yes, patient's daughter and son at bed side Disposition Plan:  Anticipate discharge back to previous home environment Consults called:  none Admission status: SDU/inpation       Date of Service 05/19/2016    Ivor Costa [facility] Pager [phone]  If 7PM-7AM, please contact night-coverage [email or web address] Password [facility] 05/19/2016, 12:25 AM

## 2016-05-18 NOTE — ED Provider Notes (Signed)
[place] DEPT Provider Note   CSN: [id number] Arrival date & time: 05/18/16  1459     History   Chief Complaint Chief Complaint  Patient presents with  . Shortness of Breath    The patient's daughter said he started having sob, cough on May 08, 2016.  The daughter was trying to treat the cold with OTC medications but he was getting worse.  She took him to his PCP and they diagnosed hin with CHF on wednesday along with pneumonia.    . Hypotension    HPI Arthur Armstrong is a 83 y.o. male.  HPI  83 y/o white male presents to ED c/o SOB, cough x 4 days. Also c/o bilateral foot pain and edema - pt is unable to ambulate due to pain, R knee pain, and 10 lb weight loss in the past week as noted by pt's daughter. Denies ear pain, fever, sore throat. Pt was seen by PCP on Wednesday - CXR positive for PNA and pt given Z-pack. Pt returned to PCP yesterday for hypotension and instructed to discontinue Lasix and Metolazone. Hx of CHF w/ pacemaker and ICD, Afib, HTN, mitral valve repair. Denies hx of gout. Pt is on Eliquis 5mg  for Afib.    Past Medical History:  Diagnosis Date  . Atrial fibrillation ([place])   . Automatic implantable cardiac defibrillator in situ    BiV pacer  . Chronic systolic CHF (congestive heart failure) ([place])   . Coronary artery disease, non-occlusive    cath 2006 pLM 20%, pLAD 40%; mCFX 20%  . Hernia, ventral   . HLD (hyperlipidemia)   . HTN (hypertension)   . ICD (implantable cardiac defibrillator) in place    BIV ICD  . NICM (nonischemic cardiomyopathy) ([place])    nicm ef 30-35%,nonob cad-cath 2006,echo 2/12:ef30-35%.mod lvh,inf hk,mild-mod lae and mild rae,pasp 35  . Pacemaker    BIV ICD  . PFO (patent foramen ovale)    s/p repair 2006  . S/P mitral valve repair    2006  . Shortness of breath   . Systolic CHF ([place])   . Unspecified essential hypertension     Patient Active Problem List   Diagnosis Date Noted  . Nonischemic cardiomyopathy ([place])  04/03/2012  . Acute on chronic systolic CHF (congestive heart failure) ([place]) 04/02/2012  . Hypoxemia 04/01/2012  . Syncope 03/31/2012  . S/P mitral valve repair   . Dyslipidemia 02/04/2012  . Atrial fibrillation ([place]) 09/21/2009  . Essential hypertension 11/04/2008  . Automatic implantable cardioverter-defibrillator in situ 11/04/2008    Past Surgical History:  Procedure Laterality Date  . BILATERAL KNEE ARTHROSCOPY    . COLONOSCOPY WITH POLYPECTOMY    . HIP PINNING  3/2/012  . IMPLANTATION OF A DUAL CHAMBER BIVENTRICULAR ICD    . INTRAOPERATIVE TRANSESOPHAGEAL ECHOCARDIOGRAM    . MEDIAN STEROTOMY FOR MITRAL VALVE REPAIR    . RT. SHOULDER         Home Medications    Prior to Admission medications   Medication Sig Start Date End Date Taking? Authorizing Provider  acetaminophen (TYLENOL) 500 MG tablet Take 500 mg by mouth every 6 (six) hours as needed.   Yes Historical Provider, MD  carvedilol (COREG) 6.25 MG tablet TAKE 1 TABLET(6.25 MG) BY MOUTH TWICE DAILY WITH A MEAL 11/28/15  Yes Evans Lance, MD  ELIQUIS 5 MG TABS tablet TAKE 1 TABLET(5 MG) BY MOUTH EVERY 12 HOURS 08/30/15  Yes Evans Lance, MD  fluticasone (VERAMYST) 27.5 MCG/SPRAY nasal spray Place  2 sprays into the nose daily.   Yes Historical Provider, MD  furosemide (LASIX) 40 MG tablet Take 40 mg by mouth See admin instructions. Take two in the morning then take 1 tablet by mouth in the afternoon   Yes Historical Provider, MD  Polyethyl Glycol-Propyl Glycol (SYSTANE OP) Place 1 drop into both eyes daily.   Yes Historical Provider, MD  potassium chloride SA (K-DUR,KLOR-CON) 20 MEQ tablet Take 2 tablets (40 mEq) in the mornings and 1 tablet (20 mEq) in the evenings   Yes Historical Provider, MD  Tamsulosin HCl (FLOMAX) 0.4 MG CAPS Take 0.4 mg by mouth at bedtime.     Yes Historical Provider, MD  niacin (NIASPAN) 500 MG CR tablet TAKE 1 TABLET BY MOUTH EVERY DAY Patient not taking: Reported on 05/18/2016 06/09/15   Evans Lance, MD    Family History Family History  Problem Relation Age of Onset  . Heart attack Mother   . Heart attack Father   . Heart disease Neg Hx     Rheumatic or valvular heart disease   . Cardiomyopathy Neg Hx     Social History Social History  Substance Use Topics  . Smoking status: Former Smoker    Packs/day: 1.00    Years: 5.00    Quit date: 05/06/1961  . Smokeless tobacco: Former Systems developer    Quit date: 05/07/1951  . Alcohol use No     Allergies   Patient has no known allergies.   Review of Systems Review of Systems  All other systems reviewed and are negative.    Physical Exam Updated Vital Signs BP 104/79   Pulse 62   Temp 97.5 F (36.4 C) (Oral)   Resp 13   Ht 5\' 6"  (1.676 m)   Wt 71.7 kg   SpO2 100%   BMI 25.50 kg/m   Physical Exam  Constitutional: He is oriented to person, place, and time. He appears well-developed and well-nourished. No distress.  HENT:  Head: Normocephalic and atraumatic.  Eyes: Conjunctivae and EOM are normal. Pupils are equal, round, and reactive to light.  Neck: Normal range of motion. Neck supple.  Cardiovascular: Normal rate, regular rhythm, normal heart sounds and intact distal pulses.   Pulmonary/Chest: Effort normal and breath sounds normal.  Abdominal: Soft. Bowel sounds are normal.  Musculoskeletal: Normal range of motion.  Erythema medial bilateral feet- with mild ttp over medial ankles  Neurological: He is alert and oriented to person, place, and time. He displays normal reflexes. No cranial nerve deficit or sensory deficit. He exhibits normal muscle tone. Coordination normal.  Skin: Skin is warm and dry. Capillary refill takes less than 2 seconds.  Psychiatric: He has a normal mood and affect. His behavior is normal.  Nursing note and vitals reviewed.    ED Treatments / Results  Labs (all labs ordered are listed, but only abnormal results are displayed) Labs Reviewed  COMPREHENSIVE METABOLIC PANEL - Abnormal;  Notable for the following:       Result Value   Sodium 134 (*)    Chloride 94 (*)    Glucose, Bld 211 (*)    BUN 64 (*)    Creatinine, Ser 2.04 (*)    ALT 14 (*)    GFR calc non Af Amer 28 (*)    GFR calc Af Amer 32 (*)    All other components within normal limits  BRAIN NATRIURETIC PEPTIDE - Abnormal; Notable for the following:    B Natriuretic Peptide 153.6 (*)  All other components within normal limits  CBC    EKG  EKG Interpretation  Date/Time:  Saturday May 18 2016 15:36:11 EST Ventricular Rate:  63 PR Interval:    QRS Duration: 170 QT Interval:  482 QTC Calculation: 493 R Axis:   -48 Text Interpretation:  VENTRICULAR PACED RHYTHM Confirmed by Miley Lindon MD, Andee Poles [phone]) on 05/18/2016 5:57:41 PM       Radiology Dg Chest 2 View  Result Date: 05/18/2016 CLINICAL DATA:  Pneumonia. Recently diagnosed pneumonia and CHF. Cough for the past week. Shortness of breath. EXAM: CHEST  2 VIEW COMPARISON:  02/27/2014. FINDINGS: Stable enlarged cardiac silhouette, median sternotomy wires, prosthetic mitral valve and left subclavian pacer and AICD leads. Stable prominence of the interstitial markings. Otherwise, clear lungs. Diffuse osteopenia. Right shoulder degenerative changes thoracic spine degenerative changes. Aortic calcification. IMPRESSION: 1. No acute abnormality. 2. Stable cardiomegaly, chronic interstitial lung disease and aortic atherosclerosis. Electronically Signed   By: Claudie Revering M.D.   On: 05/18/2016 16:33    Procedures Procedures (including critical care time)  Medications Ordered in ED Medications  albuterol (PROVENTIL) (2.5 MG/3ML) 0.083% nebulizer solution (not administered)  sodium chloride 0.9 % bolus 500 mL (500 mLs Intravenous New Bag/Given 05/18/16 1956)  albuterol (PROVENTIL) (2.5 MG/3ML) 0.083% nebulizer solution 5 mg (5 mg Nebulization Given 05/18/16 1529)     Initial Impression / Assessment and Plan / ED Course  I have reviewed the triage vital  signs and the nursing notes.  Pertinent labs & imaging results that were available during my care of the patient were reviewed by me and considered in my medical decision making (see chart for details).  Clinical Course   Patient stable here with bp increased from 93/67 to110/67 after fluid bolus.  Mild creatinine elevation from 1.64 to 2.04.  Patient urinating and has been on increased dose of diuretics. Bilateral erythema medial ankles and soles of feet unclear etiology.  1- uri 2- volume depletion/aki 3- bilateral foot pain.  Patient now with decreased bp at 84/54 and fluid bolus being given.  Discussed with Dr. Blaine Hamper and plan step down admission.    Final Clinical Impressions(s) / ED Diagnoses   Final diagnoses:  None    New Prescriptions New Prescriptions   No medications on file     Pattricia Boss, MD 05/18/16 2348

## 2016-05-18 NOTE — ED Notes (Signed)
Patient transported to X-ray 

## 2016-05-19 ENCOUNTER — Inpatient Hospital Stay (HOSPITAL_COMMUNITY): Payer: Medicare Other

## 2016-05-19 DIAGNOSIS — N179 Acute kidney failure, unspecified: Secondary | ICD-10-CM

## 2016-05-19 DIAGNOSIS — R0600 Dyspnea, unspecified: Secondary | ICD-10-CM

## 2016-05-19 DIAGNOSIS — Z95810 Presence of automatic (implantable) cardiac defibrillator: Secondary | ICD-10-CM

## 2016-05-19 DIAGNOSIS — J9601 Acute respiratory failure with hypoxia: Secondary | ICD-10-CM

## 2016-05-19 DIAGNOSIS — I5022 Chronic systolic (congestive) heart failure: Secondary | ICD-10-CM

## 2016-05-19 DIAGNOSIS — R0609 Other forms of dyspnea: Secondary | ICD-10-CM

## 2016-05-19 DIAGNOSIS — J9600 Acute respiratory failure, unspecified whether with hypoxia or hypercapnia: Secondary | ICD-10-CM | POA: Diagnosis present

## 2016-05-19 DIAGNOSIS — E119 Type 2 diabetes mellitus without complications: Secondary | ICD-10-CM

## 2016-05-19 DIAGNOSIS — R05 Cough: Secondary | ICD-10-CM | POA: Diagnosis present

## 2016-05-19 DIAGNOSIS — R059 Cough, unspecified: Secondary | ICD-10-CM | POA: Diagnosis present

## 2016-05-19 DIAGNOSIS — G629 Polyneuropathy, unspecified: Secondary | ICD-10-CM

## 2016-05-19 DIAGNOSIS — N183 Chronic kidney disease, stage 3 (moderate): Secondary | ICD-10-CM

## 2016-05-19 LAB — RESPIRATORY PANEL BY PCR
Adenovirus: NOT DETECTED
Bordetella pertussis: NOT DETECTED
CORONAVIRUS 229E-RVPPCR: NOT DETECTED
CORONAVIRUS HKU1-RVPPCR: NOT DETECTED
CORONAVIRUS OC43-RVPPCR: NOT DETECTED
Chlamydophila pneumoniae: NOT DETECTED
Coronavirus NL63: NOT DETECTED
Influenza A: NOT DETECTED
Influenza B: NOT DETECTED
METAPNEUMOVIRUS-RVPPCR: NOT DETECTED
Mycoplasma pneumoniae: NOT DETECTED
PARAINFLUENZA VIRUS 1-RVPPCR: NOT DETECTED
PARAINFLUENZA VIRUS 2-RVPPCR: NOT DETECTED
Parainfluenza Virus 3: NOT DETECTED
Parainfluenza Virus 4: NOT DETECTED
Respiratory Syncytial Virus: NOT DETECTED
Rhinovirus / Enterovirus: NOT DETECTED

## 2016-05-19 LAB — CREATININE, URINE, RANDOM: Creatinine, Urine: 75.21 mg/dL

## 2016-05-19 LAB — URINALYSIS, ROUTINE W REFLEX MICROSCOPIC
Bacteria, UA: NONE SEEN
Bilirubin Urine: NEGATIVE
GLUCOSE, UA: NEGATIVE mg/dL
KETONES UR: NEGATIVE mg/dL
LEUKOCYTES UA: NEGATIVE
Nitrite: NEGATIVE
PH: 5 (ref 5.0–8.0)
Protein, ur: NEGATIVE mg/dL
SPECIFIC GRAVITY, URINE: 1.012 (ref 1.005–1.030)
Squamous Epithelial / HPF: NONE SEEN

## 2016-05-19 LAB — BASIC METABOLIC PANEL
Anion gap: 8 (ref 5–15)
BUN: 53 mg/dL — AB (ref 6–20)
CALCIUM: 8.9 mg/dL (ref 8.9–10.3)
CO2: 28 mmol/L (ref 22–32)
CREATININE: 1.63 mg/dL — AB (ref 0.61–1.24)
Chloride: 101 mmol/L (ref 101–111)
GFR calc Af Amer: 42 mL/min — ABNORMAL LOW (ref >60)
GFR, EST NON AFRICAN AMERICAN: 36 mL/min — AB (ref >60)
GLUCOSE: 135 mg/dL — AB (ref 65–99)
POTASSIUM: 4.1 mmol/L (ref 3.5–5.1)
SODIUM: 137 mmol/L (ref 135–145)

## 2016-05-19 LAB — PROCALCITONIN

## 2016-05-19 LAB — CBG MONITORING, ED
GLUCOSE-CAPILLARY: 112 mg/dL — AB (ref 65–99)
GLUCOSE-CAPILLARY: 143 mg/dL — AB (ref 65–99)
Glucose-Capillary: 195 mg/dL — ABNORMAL HIGH (ref 65–99)

## 2016-05-19 LAB — GLUCOSE, CAPILLARY
Glucose-Capillary: 193 mg/dL — ABNORMAL HIGH (ref 65–99)
Glucose-Capillary: 164 mg/dL — ABNORMAL HIGH (ref 65–99)

## 2016-05-19 LAB — LACTIC ACID, PLASMA
LACTIC ACID, VENOUS: 0.9 mmol/L (ref 0.5–1.9)
Lactic Acid, Venous: 1.1 mmol/L (ref 0.5–1.9)

## 2016-05-19 LAB — MAGNESIUM: Magnesium: 2.5 mg/dL — ABNORMAL HIGH (ref 1.7–2.4)

## 2016-05-19 LAB — INFLUENZA PANEL BY PCR (TYPE A & B)
INFLBPCR: NEGATIVE
Influenza A By PCR: NEGATIVE

## 2016-05-19 LAB — TSH: TSH: 3.897 u[IU]/mL (ref 0.350–4.500)

## 2016-05-19 LAB — TROPONIN I
TROPONIN I: 0.03 ng/mL — AB (ref <0.03)
Troponin I: 0.03 ng/mL (ref <0.03)
Troponin I: 0.04 ng/mL (ref <0.03)

## 2016-05-19 MED ORDER — IPRATROPIUM-ALBUTEROL 0.5-2.5 (3) MG/3ML IN SOLN
3.0000 mL | Freq: Three times a day (TID) | RESPIRATORY_TRACT | Status: DC
  Administered 2016-05-20 – 2016-05-21 (×4): 3 mL via RESPIRATORY_TRACT
  Filled 2016-05-19 (×5): qty 3

## 2016-05-19 MED ORDER — ASPIRIN EC 81 MG PO TBEC
81.0000 mg | DELAYED_RELEASE_TABLET | Freq: Every day | ORAL | Status: DC
  Administered 2016-05-19 – 2016-05-21 (×3): 81 mg via ORAL
  Filled 2016-05-19 (×3): qty 1

## 2016-05-19 MED ORDER — TAMSULOSIN HCL 0.4 MG PO CAPS
0.4000 mg | ORAL_CAPSULE | Freq: Every day | ORAL | Status: DC
  Administered 2016-05-20: 0.4 mg via ORAL
  Filled 2016-05-19: qty 1

## 2016-05-19 MED ORDER — SODIUM CHLORIDE 0.9% FLUSH
3.0000 mL | Freq: Two times a day (BID) | INTRAVENOUS | Status: DC
  Administered 2016-05-20 – 2016-05-21 (×2): 3 mL via INTRAVENOUS

## 2016-05-19 MED ORDER — SODIUM CHLORIDE 0.9 % IV SOLN
INTRAVENOUS | Status: DC
  Administered 2016-05-19 – 2016-05-20 (×3): via INTRAVENOUS

## 2016-05-19 MED ORDER — INSULIN ASPART 100 UNIT/ML ~~LOC~~ SOLN: 0.0000 [IU] | Freq: Every day | SUBCUTANEOUS | Status: DC

## 2016-05-19 MED ORDER — DM-GUAIFENESIN ER 30-600 MG PO TB12
1.0000 | ORAL_TABLET | Freq: Two times a day (BID) | ORAL | Status: DC
  Administered 2016-05-19 – 2016-05-21 (×6): 1 via ORAL
  Filled 2016-05-19 (×6): qty 1

## 2016-05-19 MED ORDER — OXYCODONE-ACETAMINOPHEN 5-325 MG PO TABS
1.0000 | ORAL_TABLET | Freq: Four times a day (QID) | ORAL | Status: DC | PRN
  Administered 2016-05-19 (×2): 1 via ORAL
  Filled 2016-05-19 (×3): qty 1

## 2016-05-19 MED ORDER — FLUTICASONE PROPIONATE 50 MCG/ACT NA SUSP
2.0000 | Freq: Every day | NASAL | Status: DC
  Administered 2016-05-19 – 2016-05-21 (×3): 2 via NASAL
  Filled 2016-05-19: qty 16

## 2016-05-19 MED ORDER — ALBUTEROL SULFATE (2.5 MG/3ML) 0.083% IN NEBU
2.5000 mg | INHALATION_SOLUTION | RESPIRATORY_TRACT | Status: DC | PRN
Start: 2016-05-19 — End: 2016-05-21

## 2016-05-19 MED ORDER — MORPHINE SULFATE (PF) 4 MG/ML IV SOLN
2.0000 mg | Freq: Once | INTRAVENOUS | Status: AC
  Administered 2016-05-19: 2 mg via INTRAVENOUS
  Filled 2016-05-19: qty 1

## 2016-05-19 MED ORDER — ONDANSETRON HCL 4 MG PO TABS: 4.0000 mg | ORAL_TABLET | Freq: Four times a day (QID) | ORAL | Status: DC | PRN

## 2016-05-19 MED ORDER — MORPHINE SULFATE (PF) 4 MG/ML IV SOLN: 2.0000 mg | INTRAVENOUS | Status: DC | PRN

## 2016-05-19 MED ORDER — ACETAMINOPHEN 500 MG PO TABS
500.0000 mg | ORAL_TABLET | Freq: Once | ORAL | Status: AC
  Administered 2016-05-19: 500 mg via ORAL
  Filled 2016-05-19: qty 1

## 2016-05-19 MED ORDER — CARVEDILOL 6.25 MG PO TABS
6.2500 mg | ORAL_TABLET | Freq: Two times a day (BID) | ORAL | Status: DC
  Administered 2016-05-19 – 2016-05-21 (×5): 6.25 mg via ORAL
  Filled 2016-05-19 (×5): qty 1

## 2016-05-19 MED ORDER — APIXABAN 2.5 MG PO TABS
2.5000 mg | ORAL_TABLET | Freq: Two times a day (BID) | ORAL | Status: DC
  Administered 2016-05-19 – 2016-05-21 (×5): 2.5 mg via ORAL
  Filled 2016-05-19 (×5): qty 1

## 2016-05-19 MED ORDER — ONDANSETRON HCL 4 MG/2ML IJ SOLN: 4.0000 mg | Freq: Four times a day (QID) | INTRAMUSCULAR | Status: DC | PRN

## 2016-05-19 MED ORDER — INSULIN ASPART 100 UNIT/ML ~~LOC~~ SOLN
0.0000 [IU] | Freq: Three times a day (TID) | SUBCUTANEOUS | Status: DC
  Filled 2016-05-19: qty 1

## 2016-05-19 MED ORDER — GABAPENTIN 100 MG PO CAPS
200.0000 mg | ORAL_CAPSULE | Freq: Three times a day (TID) | ORAL | Status: DC
  Administered 2016-05-19 – 2016-05-20 (×7): 200 mg via ORAL
  Filled 2016-05-19 (×7): qty 2

## 2016-05-19 MED ORDER — POLYVINYL ALCOHOL 1.4 % OP SOLN
Freq: Every day | OPHTHALMIC | Status: DC
  Administered 2016-05-19 – 2016-05-21 (×3): via OPHTHALMIC
  Filled 2016-05-19: qty 15

## 2016-05-19 MED ORDER — ACETAMINOPHEN 500 MG PO TABS
500.0000 mg | ORAL_TABLET | Freq: Four times a day (QID) | ORAL | Status: DC | PRN
  Administered 2016-05-19: 500 mg via ORAL
  Filled 2016-05-19: qty 1

## 2016-05-19 MED ORDER — APIXABAN 5 MG PO TABS: 5.0000 mg | ORAL_TABLET | Freq: Two times a day (BID) | ORAL | Status: DC

## 2016-05-19 MED ORDER — IPRATROPIUM-ALBUTEROL 0.5-2.5 (3) MG/3ML IN SOLN
3.0000 mL | Freq: Four times a day (QID) | RESPIRATORY_TRACT | Status: DC
  Administered 2016-05-19 (×4): 3 mL via RESPIRATORY_TRACT
  Filled 2016-05-19 (×5): qty 3

## 2016-05-19 MED ORDER — ZOLPIDEM TARTRATE 5 MG PO TABS: 5.0000 mg | ORAL_TABLET | Freq: Every evening | ORAL | Status: DC | PRN

## 2016-05-19 MED ORDER — ORAL CARE MOUTH RINSE
15.0000 mL | Freq: Two times a day (BID) | OROMUCOSAL | Status: DC
  Administered 2016-05-19 – 2016-05-21 (×4): 15 mL via OROMUCOSAL

## 2016-05-19 NOTE — ED Notes (Signed)
Pt's daughter states that pt is disoriented and that is a new problem. Pt is alert and oriented to person. Pt states he is in the nursing home with his wife. MD notified.

## 2016-05-19 NOTE — Progress Notes (Signed)
PROGRESS NOTE    Arthur Armstrong  [id number] DOB: 1927-05-06 DOA: 05/18/2016  PCP: Haywood Pao, MD   Brief Narrative:  Arthur Armstrong is a 83 y.o. male with medical history significant of hypertension, hyperlipidemia, sCHF with EF 30-35%, s/o of mitral valve repair, PFO, pacemaker, ICD placement, atrial fibrillation on Eliquis, BPH, CKD-3, who presents with cough, shortness of breath and leg pain.  Subjective: Cough with shortness of breath for almost 1 wk now. Poor appetite, very weak.   Assessment & Plan:  Dyspnea and cough:  - likely to have upper respiratory viral infection and viral bronchitis given coarse breathing sounds.  -Nebulizers: scheduled Duoneb and prn albuterol -Mucinex for cough  -Follow up blood culture x2, respiratory virus panel, Flu pcr - troponin negative   Hypotension:  Possibly due to dehydration secondary to recent escalation of diuretics treatment.   -IVF: 1.5 L NS, then 75 cc/h - influenza negative -blood culture x 2 - UA mildly positive   HTN: -hold lasix due to hypoension -on Coreg for A fib with holding parameters  Atrial Fibrillation:  CHA2DS2-VASc Score is 5, needs oral anticoagulation.  - Heart rate is well controlled. -continue Eliquis and coreg  Chronic systolic CHF:  - BNP 0000000. Clinically patient seems to be dry. -Hold Lasix due to hypotension and worsening renal function -Continue Coreg  DM-II: blood sugar 211. Patient does not carry diagnosis of diabetes.  - Chart review showed that he had A1c 7.0 on 06/17/10.   - follow  AoCKD-III: - Baseline Cre is 1.62 on 02/28/14, pt's Cre is 2.04, BUN 64 on admission. Likely due to prerenal secondary to dehydration and continuation of diruetics - IVF as above - Check FeUrea - Follow up renal function by BMP - Hold Diuretics   Pain in b/l feet- neuropathy? - follow- started on Neurontin on admission  DVT prophylaxis: Eliquis Code Status: Full code Family Communication:    Disposition Plan: home when stable Consultants:    Procedures:    Antimicrobials:  Anti-infectives    None       Objective: Vitals:   05/19/16 1130 05/19/16 1145 05/19/16 1215 05/19/16 1245  BP: 104/74 107/71 106/69 103/67  Pulse: 67 67 65 60  Resp: 23 24 24 24   Temp:      TempSrc:      SpO2: 100% 99% 99% 95%  Weight:      Height:       No intake or output data in the 24 hours ending 05/19/16 1313 Filed Weights   05/18/16 1521  Weight: 71.7 kg (158 lb)    Examination: General exam: Appears comfortable  HEENT: PERRLA, oral mucosa moist, no sclera icterus or thrush Respiratory system: Clear to auscultation. Respiratory effort normal. Cardiovascular system: S1 & S2 heard, RRR.  No murmurs  Gastrointestinal system: Abdomen soft, non-tender, nondistended. Normal bowel sound. No organomegaly Central nervous system: Alert and oriented. No focal neurological deficits. Extremities: No cyanosis, clubbing or edema Skin: No rashes or ulcers Psychiatry:  Mood & affect appropriate.     Data Reviewed: I have personally reviewed following labs and imaging studies  CBC:  Recent Labs Lab 05/18/16 2024  WBC 8.2  HGB 13.4  HCT 41.5  MCV 94.5  PLT [id number]   Basic Metabolic Panel:  Recent Labs Lab 05/18/16 2024 05/19/16 0846  NA 134* 137  K 4.2 4.1  CL 94* 101  CO2 27 28  GLUCOSE 211* 135*  BUN 64* 53*  CREATININE 2.04* 1.63*  CALCIUM 9.2 8.9   GFR: Estimated Creatinine Clearance: 28.8 mL/min (by C-G formula based on SCr of 1.63 mg/dL (H)). Liver Function Tests:  Recent Labs Lab 05/18/16 2024  AST 18  ALT 14*  ALKPHOS 97  BILITOT 1.0  PROT 7.5  ALBUMIN 4.0   No results for input(s): LIPASE, AMYLASE in the last 168 hours. No results for input(s): AMMONIA in the last 168 hours. Coagulation Profile: No results for input(s): INR, PROTIME in the last 168 hours. Cardiac Enzymes:  Recent Labs Lab 05/19/16 0040 05/19/16 0620  TROPONINI 0.03* 0.03*    BNP (last 3 results) No results for input(s): PROBNP in the last 8760 hours. HbA1C: No results for input(s): HGBA1C in the last 72 hours. CBG:  Recent Labs Lab 05/19/16 0235 05/19/16 0806 05/19/16 1220  GLUCAP 112* 143* 195*   Lipid Profile: No results for input(s): CHOL, HDL, LDLCALC, TRIG, CHOLHDL, LDLDIRECT in the last 72 hours. Thyroid Function Tests:  Recent Labs  05/19/16 0330  TSH 3.897   Anemia Panel: No results for input(s): VITAMINB12, FOLATE, FERRITIN, TIBC, IRON, RETICCTPCT in the last 72 hours. Urine analysis:    Component Value Date/Time   COLORURINE YELLOW 05/19/2016 0310   APPEARANCEUR CLEAR 05/19/2016 0310   LABSPEC 1.012 05/19/2016 0310   PHURINE 5.0 05/19/2016 0310   GLUCOSEU NEGATIVE 05/19/2016 0310   HGBUR SMALL (A) 05/19/2016 0310   BILIRUBINUR NEGATIVE 05/19/2016 0310   KETONESUR NEGATIVE 05/19/2016 0310   PROTEINUR NEGATIVE 05/19/2016 0310   UROBILINOGEN 0.2 02/27/2014 1343   NITRITE NEGATIVE 05/19/2016 0310   LEUKOCYTESUR NEGATIVE 05/19/2016 0310   Sepsis Labs: @LABRCNTIP (procalcitonin:4,lacticidven:4) )No results found for this or any previous visit (from the past 240 hour(s)).       Radiology Studies: Dg Chest 2 View  Result Date: 05/18/2016 CLINICAL DATA:  Pneumonia. Recently diagnosed pneumonia and CHF. Cough for the past week. Shortness of breath. EXAM: CHEST  2 VIEW COMPARISON:  02/27/2014. FINDINGS: Stable enlarged cardiac silhouette, median sternotomy wires, prosthetic mitral valve and left subclavian pacer and AICD leads. Stable prominence of the interstitial markings. Otherwise, clear lungs. Diffuse osteopenia. Right shoulder degenerative changes thoracic spine degenerative changes. Aortic calcification. IMPRESSION: 1. No acute abnormality. 2. Stable cardiomegaly, chronic interstitial lung disease and aortic atherosclerosis. Electronically Signed   By: Claudie Revering M.D.   On: 05/18/2016 16:33   Ct Chest Wo Contrast  Result  Date: 05/19/2016 CLINICAL DATA:  Cough.  Hypoxia. EXAM: CT CHEST WITHOUT CONTRAST TECHNIQUE: Multidetector CT imaging of the chest was performed following the standard protocol without IV contrast. COMPARISON:  06/17/2010 FINDINGS: Cardiovascular: Coronary, aortic arch, and branch vessel atherosclerotic vascular disease. Pacer leads noted. Prosthetic mitral valve. Moderate cardiomegaly. Mediastinum/Nodes: Scattered mildly enlarged lymph nodes, including a 1.7 cm in short axis right lower paratracheal node with a fatty hilum (formerly the same) and a lymph node anterior to the left pulmonary artery measuring 1 cm 1.2 cm in short axis (formerly 1.5 cm). Lungs/Pleura: Mild peripheral interstitial accentuation especially at the lung bases, with bilateral airway thickening. Mild atelectasis and slightly more coarse interstitial accentuation in the lower lobes and lower portions of the lingula and right middle lobe. Upper Abdomen: We partially include a 8.1 by 7.0 mildly heterogeneous hyperdense lesion fairly closely associated with the pancreas, adrenal gland, and splenic vasculature. A prior adrenal mass in this vicinity measured 2.8 by 2.3 cm on 06/17/2010. Left hepatic lobe cyst. Musculoskeletal: Thoracic spondylosis. Severe degenerative glenohumeral arthropathy on the right. IMPRESSION: 1. 8.1 cm in diameter  hyperdense and enlarging adrenal mass, adrenal malignancy is not excluded. 2. Scattered volume loss and interstitial accentuation at the lung bases with airway thickening, possible bronchitis with superimposed chronic interstitial lung disease. 3. Moderate cardiomegaly with extensive atherosclerosis. 4. Mild adenopathy in the chest is similar to 06/17/2010 and may be crown I chronic reactive adenopathy or nodal congestion due to low grade congestive heart failure. Electronically Signed   By: Van Clines M.D.   On: 05/19/2016 09:53      Scheduled Meds: . apixaban  2.5 mg Oral BID  . aspirin EC  81 mg  Oral Daily  . carvedilol  6.25 mg Oral BID WC  . dextromethorphan-guaiFENesin  1 tablet Oral BID  . fluticasone  2 spray Each Nare Daily  . gabapentin  200 mg Oral TID  . ipratropium-albuterol  3 mL Nebulization Q6H  . polyvinyl alcohol   Both Eyes Daily  . sodium chloride flush  3 mL Intravenous Q12H  . [START ON 05/20/2016] tamsulosin  0.4 mg Oral QHS   Continuous Infusions: . sodium chloride 75 mL/hr at 05/19/16 0337     LOS: 1 day    Time spent in minutes: 53    [place], MD [facility] Pager: [email or web address] Password [email or web address] 05/19/2016, 1:13 PM

## 2016-05-19 NOTE — Progress Notes (Signed)
PRN tylenol given for fever per pt nurse. Will recheck temp.

## 2016-05-19 NOTE — ED Notes (Signed)
Dr. Wynelle Cleveland made aware of pt's change in neuro status. No new orders given at this time.

## 2016-05-20 ENCOUNTER — Inpatient Hospital Stay (HOSPITAL_COMMUNITY): Payer: Medicare Other

## 2016-05-20 DIAGNOSIS — J209 Acute bronchitis, unspecified: Principal | ICD-10-CM

## 2016-05-20 DIAGNOSIS — R789 Finding of unspecified substance, not normally found in blood: Secondary | ICD-10-CM

## 2016-05-20 DIAGNOSIS — M79671 Pain in right foot: Secondary | ICD-10-CM

## 2016-05-20 DIAGNOSIS — I10 Essential (primary) hypertension: Secondary | ICD-10-CM

## 2016-05-20 DIAGNOSIS — I959 Hypotension, unspecified: Secondary | ICD-10-CM

## 2016-05-20 DIAGNOSIS — Z9889 Other specified postprocedural states: Secondary | ICD-10-CM

## 2016-05-20 DIAGNOSIS — M79672 Pain in left foot: Secondary | ICD-10-CM

## 2016-05-20 DIAGNOSIS — I482 Chronic atrial fibrillation: Secondary | ICD-10-CM

## 2016-05-20 LAB — BASIC METABOLIC PANEL
ANION GAP: 8 (ref 5–15)
BUN: 52 mg/dL — AB (ref 6–20)
CALCIUM: 8.8 mg/dL — AB (ref 8.9–10.3)
CO2: 27 mmol/L (ref 22–32)
Chloride: 101 mmol/L (ref 101–111)
Creatinine, Ser: 1.72 mg/dL — ABNORMAL HIGH (ref 0.61–1.24)
GFR calc Af Amer: 39 mL/min — ABNORMAL LOW (ref >60)
GFR, EST NON AFRICAN AMERICAN: 34 mL/min — AB (ref >60)
Glucose, Bld: 111 mg/dL — ABNORMAL HIGH (ref 65–99)
POTASSIUM: 4.1 mmol/L (ref 3.5–5.1)
Sodium: 136 mmol/L (ref 135–145)

## 2016-05-20 LAB — ECHOCARDIOGRAM COMPLETE
Height: 66 in
WEIGHTICAEL: 2539.7 [oz_av]

## 2016-05-20 LAB — UREA NITROGEN, URINE: UREA NITROGEN UR: 808 mg/dL

## 2016-05-20 LAB — CBC
HEMATOCRIT: 36.7 % — AB (ref 39.0–52.0)
Hemoglobin: 11.6 g/dL — ABNORMAL LOW (ref 13.0–17.0)
MCH: 29.9 pg (ref 26.0–34.0)
MCHC: 31.6 g/dL (ref 30.0–36.0)
MCV: 94.6 fL (ref 78.0–100.0)
Platelets: 118 10*3/uL — ABNORMAL LOW (ref 150–400)
RBC: 3.88 MIL/uL — ABNORMAL LOW (ref 4.22–5.81)
RDW: 14 % (ref 11.5–15.5)
WBC: 8.7 10*3/uL (ref 4.0–10.5)

## 2016-05-20 LAB — HEMOGLOBIN A1C
HEMOGLOBIN A1C: 6.3 % — AB (ref 4.8–5.6)
MEAN PLASMA GLUCOSE: 134 mg/dL

## 2016-05-20 LAB — GLUCOSE, CAPILLARY
GLUCOSE-CAPILLARY: 109 mg/dL — AB (ref 65–99)
GLUCOSE-CAPILLARY: 190 mg/dL — AB (ref 65–99)
GLUCOSE-CAPILLARY: 238 mg/dL — AB (ref 65–99)
Glucose-Capillary: 285 mg/dL — ABNORMAL HIGH (ref 65–99)

## 2016-05-20 MED ORDER — ACETAMINOPHEN 500 MG PO TABS
500.0000 mg | ORAL_TABLET | Freq: Four times a day (QID) | ORAL | Status: DC | PRN
  Administered 2016-05-20 – 2016-05-21 (×3): 500 mg via ORAL
  Filled 2016-05-20 (×3): qty 1

## 2016-05-20 MED ORDER — ACETAMINOPHEN 500 MG PO TABS
500.0000 mg | ORAL_TABLET | Freq: Every day | ORAL | Status: DC
  Administered 2016-05-20 – 2016-05-21 (×2): 500 mg via ORAL
  Filled 2016-05-20 (×2): qty 1

## 2016-05-20 MED ORDER — PREDNISONE 50 MG PO TABS
50.0000 mg | ORAL_TABLET | Freq: Every day | ORAL | Status: DC
  Administered 2016-05-21: 50 mg via ORAL
  Filled 2016-05-20: qty 1

## 2016-05-20 MED ORDER — METHYLPREDNISOLONE SODIUM SUCC 40 MG IJ SOLR
40.0000 mg | Freq: Once | INTRAMUSCULAR | Status: AC
  Administered 2016-05-20: 40 mg via INTRAVENOUS
  Filled 2016-05-20: qty 1

## 2016-05-20 MED ORDER — AZITHROMYCIN 500 MG PO TABS
500.0000 mg | ORAL_TABLET | Freq: Every day | ORAL | Status: AC
  Administered 2016-05-20: 500 mg via ORAL
  Filled 2016-05-20: qty 1

## 2016-05-20 MED ORDER — WHITE PETROLATUM GEL
Status: AC
  Administered 2016-05-20: 23:00:00
  Filled 2016-05-20: qty 1

## 2016-05-20 MED ORDER — AZITHROMYCIN 500 MG PO TABS
250.0000 mg | ORAL_TABLET | Freq: Every day | ORAL | Status: DC
  Administered 2016-05-21: 250 mg via ORAL
  Filled 2016-05-20: qty 1

## 2016-05-20 NOTE — Evaluation (Signed)
Physical Therapy Evaluation Patient Details Name: Arthur Armstrong MRN: [id number] DOB: 1927/03/18 Today's Date: 05/20/2016   History of Present Illness  Pt is an 83 y/o male who presents with onset of bilateral knee and foot pain, as well as SOB. Pt on supplemental O2 at home 24 hours.   Clinical Impression  Pt admitted with above diagnosis. Pt currently with functional limitations due to the deficits listed below (see PT Problem List). At the time of PT eval pt was able to perform transfers with up to max assist for safe sit<>stand. Pt will require +2 assist for OOB mobility in future sessions. Recommended use of Stedy with staff for transfers to/from [facility]. Bilateral knees were tender to light palpation and balls of bilateral feet were also tender to touch. Pt reports R generally worse than L. Pt's daughter present during session and rehab at the SNF level was discussed with both pt and daughter, is improvement was not seen by next PT session. They were agreeable. Pt will benefit from skilled PT to increase their independence and safety with mobility to allow discharge to the venue listed below.       Follow Up Recommendations SNF;Supervision/Assistance - 24 hour    Equipment Recommendations  3in1 (PT)    Recommendations for Other Services       Precautions / Restrictions Precautions Precautions: Fall Restrictions Weight Bearing Restrictions: No      Mobility  Bed Mobility Overal bed mobility: Needs Assistance Bed Mobility: Supine to Sit;Sit to Supine     Supine to sit: Mod assist Sit to supine: Mod assist   General bed mobility comments: Assist for LE movement towards EOB, and to elevate LE's back into bed at end of session. HOB elevated and heavy use of rails required.   Transfers Overall transfer level: Needs assistance Equipment used: Rolling walker (2 wheeled) Transfers: Sit to/from Stand Sit to Stand: Max assist         General transfer comment: Assist to power-up  to full standing position. Bed height was raised for ease of transfer, however max assist was still required. Pt walking feet out in front of him seemingly to avoid full knee extension in standing. Noted weight shift to the L side to attempt to off weight the R knee which pt states is the most painful.   Ambulation/Gait             General Gait Details: Unable  Stairs            Wheelchair Mobility    Modified Rankin (Stroke Patients Only)       Balance Overall balance assessment: Needs assistance Sitting-balance support: Feet supported;No upper extremity supported Sitting balance-Leahy Scale: Fair     Standing balance support: Bilateral upper extremity supported;During functional activity Standing balance-Leahy Scale: Zero Standing balance comment: Max assist provided                             Pertinent Vitals/Pain Pain Assessment: 0-10 Pain Score: 10-Worst pain ever Pain Location: Knees/feet Pain Descriptors / Indicators: Numbness;Tender Pain Intervention(s): Limited activity within patient's tolerance;Monitored during session;RN gave pain meds during session    [place] expects to be discharged to:: Private residence Living Arrangements: Alone Available Help at Discharge: Family;Available 24 hours/day Type of Home: House Home Access: Stairs to enter Entrance Stairs-Rails: Left Entrance Stairs-Number of Steps: 3 Home Layout: Two level Home Equipment: Walker - 2 wheels;Cane - single point;Shower seat  Prior Function Level of Independence: Independent with assistive device(s)         Comments: Used the cane all the time     Hand Dominance   Dominant Hand: Right    Extremity/Trunk Assessment   Upper Extremity Assessment Upper Extremity Assessment: Defer to OT evaluation    Lower Extremity Assessment Lower Extremity Assessment: RLE deficits/detail;LLE deficits/detail RLE Deficits / Details: Pt reports RLE  more painful than LLE. Pt was able to weight shift to L in standing to offweight the R side. Was not able to MMT due to pain.     Cervical / Trunk Assessment Cervical / Trunk Assessment: Kyphotic  Communication   Communication: HOH  Cognition Arousal/Alertness: Awake/alert Behavior During Therapy: WFL for tasks assessed/performed Overall Cognitive Status: Within Functional Limits for tasks assessed                      General Comments      Exercises     Assessment/Plan    PT Assessment Patient needs continued PT services  PT Problem List Decreased strength;Decreased range of motion;Decreased activity tolerance;Decreased balance;Decreased mobility;Decreased knowledge of use of DME;Decreased safety awareness;Decreased knowledge of precautions;Pain          PT Treatment Interventions DME instruction;Gait training;Stair training;Functional mobility training;Therapeutic activities;Therapeutic exercise;Neuromuscular re-education;Patient/family education    PT Goals (Current goals can be found in the Care Plan section)  Acute Rehab PT Goals Patient Stated Goal: Decrease pain PT Goal Formulation: With patient Time For Goal Achievement: 06/03/16 Potential to Achieve Goals: Good    Frequency Min 3X/week   Barriers to discharge        Co-evaluation               End of Session Equipment Utilized During Treatment: Gait belt;Oxygen Activity Tolerance: Patient limited by pain Patient left: in bed;with call bell/phone within reach;with bed alarm set;with family/visitor present Nurse Communication: Mobility status;Need for lift equipment (Recommended Stedy for transfers to/from [facility])         Time: 1355-1430 PT Time Calculation (min) (ACUTE ONLY): 35 min   Charges:   PT Evaluation $PT Eval Moderate Complexity: 1 Procedure PT Treatments $Gait Training: 8-22 mins   PT G Codes:        Thelma Comp Jun 14, 2016, 3:26 PM   Rolinda Roan, PT, DPT Acute  Rehabilitation Services Pager: [phone]

## 2016-05-20 NOTE — Progress Notes (Signed)
PROGRESS NOTE    Arthur Armstrong  [id number] DOB: 25-May-1926 DOA: 05/18/2016  PCP: Haywood Pao, MD   Brief Narrative:  Arthur Armstrong is a 83 y.o. male with medical history significant of hypertension, hyperlipidemia, sCHF with EF 30-35%, s/o of mitral valve repair, PFO, pacemaker, ICD placement, atrial fibrillation on Eliquis, BPH, CKD-3, who presents with cough, shortness of breath and leg pain.  Subjective: Cough and shortness of breath improved. Feet not hurting as much but knees still hurting. Cannot lift up legs.   Assessment & Plan:  Dyspnea and cough:  - likely to have upper respiratory viral infection and viral bronchitis given coarse breathing sounds.  - CT chest - no pneumonia - cont scheduled Duoneb and prn albuterol -Mucinex for cough  -Follow up blood culture NGTD, respiratory virus and Flu pcr neg - symptoms much improved today   Hypotension:  - Possibly due to dehydration secondary to recent escalation of diuretics treatment.   -IVF: 1.5 L NS bolus given then 75 cc/h- will d/c fluid today   B/l knee pain - has underlying severe arthritis and per his family, they were today he is not a candidate for knee replacement due to other medical issues - b/l swelling of knees noted today- tender to palpation- not able to stand due to pain - will give a short course of Prednisone - avoid NSAIDS due to CKD- can give Tylenol which is what he takes at home- family would like to avoid narcotics and I agree - close outpt f/u with orthopedic surgery for Intra-articular injections as needed  Foot pain - possibly due to recent edema of feet   - feet non-tender and not edematous today- has good pedal pulses so doubt claudication- DM does not appear to be severe enough to have caused diabetic neuropathy - cont to follow- started on Neurontin in ER by Dr Blaine Hamper- family feels it it helping and would like to continue it  Chronic systolic CHF:  - BNP 0000000. Clinically patient  seems to be dry. -Hold Lasix due to hypotension and worsening renal function -Continue Coreg - ECHO pending  DM-II: blood sugar 211. Patient does not carry diagnosis of diabetes.  - Chart review showed that he had A1c 7.0 on 06/17/10 - Hba1c 6.3 now - follow sugars - may need insulin due to steroids  AoCKD-III: - Baseline Cre is 1.62 on 02/28/14, pt's Cre is 2.04, BUN 64 on admission. Likely due to prerenal secondary to dehydration and continuation of diruetics - IVF as above- Cr improved to 1.72- stop fluid today- cont to hold diuretics  HTN: -holding lasix due to hypoension -on Coreg for A fib with holding parameters  Atrial Fibrillation:  CHA2DS2-VASc Score is 5, needs oral anticoagulation.  - Heart rate is well controlled. -continue Eliquis and coreg  Anemia/ mild thrombocytopenia  - follow - both appear chronic when compared with old labwork  DVT prophylaxis: Eliquis Code Status: Full code Family Communication:  Disposition Plan: home when stable Consultants:    Procedures:    Antimicrobials:  Anti-infectives    Start     Dose/Rate Route Frequency Ordered Stop   05/21/16 1000  azithromycin (ZITHROMAX) tablet 250 mg     250 mg Oral Daily 05/20/16 0748 05/25/16 0959   05/20/16 1000  azithromycin (ZITHROMAX) tablet 500 mg     500 mg Oral Daily 05/20/16 0748 05/20/16 0926       Objective: Vitals:   05/20/16 0033 05/20/16 0601 05/20/16 [id number] 05/20/16 [id number]  BP:   99/69   Pulse:   65   Resp:   18   Temp: (!) 100.6 F (38.1 C)  97.3 F (36.3 C)   TempSrc: Rectal     SpO2:   98% 98%  Weight:  72 kg (158 lb 11.7 oz)    Height:        Intake/Output Summary (Last 24 hours) at 05/20/16 1310 Last data filed at 05/20/16 1154  Gross per 24 hour  Intake             2260 ml  Output              750 ml  Net             1510 ml   Filed Weights   05/18/16 1521 05/20/16 0601  Weight: 71.7 kg (158 lb) 72 kg (158 lb 11.7 oz)    Examination: General exam:  Appears comfortable  HEENT: PERRLA, oral mucosa moist, no sclera icterus or thrush Respiratory system: Clear to auscultation. Respiratory effort normal. Cardiovascular system: S1 & S2 heard, RRR.  No murmurs  Gastrointestinal system: Abdomen soft, non-tender, nondistended. Normal bowel sound. No organomegaly Central nervous system: Alert and oriented. No focal neurological deficits. Extremities: No cyanosis, clubbing or edema- mild swelling of b/l knees with tenderness to palpation Skin: No rashes or ulcers Psychiatry:  Mood & affect appropriate.     Data Reviewed: I have personally reviewed following labs and imaging studies  CBC:  Recent Labs Lab 05/18/16 2024 05/20/16 0429  WBC 8.2 8.7  HGB 13.4 11.6*  HCT 41.5 36.7*  MCV 94.5 94.6  PLT 156 [id number]*   Basic Metabolic Panel:  Recent Labs Lab 05/18/16 2024 05/19/16 0846 05/19/16 1243 05/20/16 0429  NA 134* 137  --  136  K 4.2 4.1  --  4.1  CL 94* 101  --  101  CO2 27 28  --  27  GLUCOSE 211* 135*  --  111*  BUN 64* 53*  --  52*  CREATININE 2.04* 1.63*  --  1.72*  CALCIUM 9.2 8.9  --  8.8*  MG  --   --  2.5*  --    GFR: Estimated Creatinine Clearance: 27.3 mL/min (by C-G formula based on SCr of 1.72 mg/dL (H)). Liver Function Tests:  Recent Labs Lab 05/18/16 2024  AST 18  ALT 14*  ALKPHOS 97  BILITOT 1.0  PROT 7.5  ALBUMIN 4.0   No results for input(s): LIPASE, AMYLASE in the last 168 hours. No results for input(s): AMMONIA in the last 168 hours. Coagulation Profile: No results for input(s): INR, PROTIME in the last 168 hours. Cardiac Enzymes:  Recent Labs Lab 05/19/16 0040 05/19/16 0620 05/19/16 1243  TROPONINI 0.03* 0.03* 0.04*   BNP (last 3 results) No results for input(s): PROBNP in the last 8760 hours. HbA1C:  Recent Labs  05/19/16 0330  HGBA1C 6.3*   CBG:  Recent Labs Lab 05/19/16 1220 05/19/16 1731 05/19/16 2214 05/20/16 0847 05/20/16 1251  GLUCAP 195* 193* 164* 109* 190*    Lipid Profile: No results for input(s): CHOL, HDL, LDLCALC, TRIG, CHOLHDL, LDLDIRECT in the last 72 hours. Thyroid Function Tests:  Recent Labs  05/19/16 0330  TSH 3.897   Anemia Panel: No results for input(s): VITAMINB12, FOLATE, FERRITIN, TIBC, IRON, RETICCTPCT in the last 72 hours. Urine analysis:    Component Value Date/Time   COLORURINE YELLOW 05/19/2016 0310   APPEARANCEUR CLEAR 05/19/2016 0310   LABSPEC 1.012 05/19/2016  0310   PHURINE 5.0 05/19/2016 0310   GLUCOSEU NEGATIVE 05/19/2016 0310   HGBUR SMALL (A) 05/19/2016 0310   BILIRUBINUR NEGATIVE 05/19/2016 0310   KETONESUR NEGATIVE 05/19/2016 0310   PROTEINUR NEGATIVE 05/19/2016 0310   UROBILINOGEN 0.2 02/27/2014 1343   NITRITE NEGATIVE 05/19/2016 0310   LEUKOCYTESUR NEGATIVE 05/19/2016 0310   Sepsis Labs: @LABRCNTIP (procalcitonin:4,lacticidven:4) ) Recent Results (from the past 240 hour(s))  Culture, blood (x 2)     Status: None (Preliminary result)   Collection Time: 05/19/16 12:40 AM  Result Value Ref Range Status   Specimen Description BLOOD BLOOD RIGHT FOREARM  Final   Special Requests BOTTLES DRAWN AEROBIC AND ANAEROBIC 5CC  Final   Culture NO GROWTH 1 DAY  Final   Report Status PENDING  Incomplete  Culture, blood (x 2)     Status: None (Preliminary result)   Collection Time: 05/19/16  3:24 AM  Result Value Ref Range Status   Specimen Description BLOOD RIGHT ARM  Final   Special Requests BOTTLES DRAWN AEROBIC AND ANAEROBIC 5CC  Final   Culture NO GROWTH 1 DAY  Final   Report Status PENDING  Incomplete  Respiratory Panel by PCR     Status: None   Collection Time: 05/19/16  9:19 AM  Result Value Ref Range Status   Adenovirus NOT DETECTED NOT DETECTED Final   Coronavirus 229E NOT DETECTED NOT DETECTED Final   Coronavirus HKU1 NOT DETECTED NOT DETECTED Final   Coronavirus NL63 NOT DETECTED NOT DETECTED Final   Coronavirus OC43 NOT DETECTED NOT DETECTED Final   Metapneumovirus NOT DETECTED NOT DETECTED  Final   Rhinovirus / Enterovirus NOT DETECTED NOT DETECTED Final   Influenza A NOT DETECTED NOT DETECTED Final   Influenza B NOT DETECTED NOT DETECTED Final   Parainfluenza Virus 1 NOT DETECTED NOT DETECTED Final   Parainfluenza Virus 2 NOT DETECTED NOT DETECTED Final   Parainfluenza Virus 3 NOT DETECTED NOT DETECTED Final   Parainfluenza Virus 4 NOT DETECTED NOT DETECTED Final   Respiratory Syncytial Virus NOT DETECTED NOT DETECTED Final   Bordetella pertussis NOT DETECTED NOT DETECTED Final   Chlamydophila pneumoniae NOT DETECTED NOT DETECTED Final   Mycoplasma pneumoniae NOT DETECTED NOT DETECTED Final         Radiology Studies: Dg Chest 2 View  Result Date: 05/18/2016 CLINICAL DATA:  Pneumonia. Recently diagnosed pneumonia and CHF. Cough for the past week. Shortness of breath. EXAM: CHEST  2 VIEW COMPARISON:  02/27/2014. FINDINGS: Stable enlarged cardiac silhouette, median sternotomy wires, prosthetic mitral valve and left subclavian pacer and AICD leads. Stable prominence of the interstitial markings. Otherwise, clear lungs. Diffuse osteopenia. Right shoulder degenerative changes thoracic spine degenerative changes. Aortic calcification. IMPRESSION: 1. No acute abnormality. 2. Stable cardiomegaly, chronic interstitial lung disease and aortic atherosclerosis. Electronically Signed   By: Claudie Revering M.D.   On: 05/18/2016 16:33   Ct Chest Wo Contrast  Result Date: 05/19/2016 CLINICAL DATA:  Cough.  Hypoxia. EXAM: CT CHEST WITHOUT CONTRAST TECHNIQUE: Multidetector CT imaging of the chest was performed following the standard protocol without IV contrast. COMPARISON:  06/17/2010 FINDINGS: Cardiovascular: Coronary, aortic arch, and branch vessel atherosclerotic vascular disease. Pacer leads noted. Prosthetic mitral valve. Moderate cardiomegaly. Mediastinum/Nodes: Scattered mildly enlarged lymph nodes, including a 1.7 cm in short axis right lower paratracheal node with a fatty hilum (formerly  the same) and a lymph node anterior to the left pulmonary artery measuring 1 cm 1.2 cm in short axis (formerly 1.5 cm). Lungs/Pleura: Mild peripheral interstitial  accentuation especially at the lung bases, with bilateral airway thickening. Mild atelectasis and slightly more coarse interstitial accentuation in the lower lobes and lower portions of the lingula and right middle lobe. Upper Abdomen: We partially include a 8.1 by 7.0 mildly heterogeneous hyperdense lesion fairly closely associated with the pancreas, adrenal gland, and splenic vasculature. A prior adrenal mass in this vicinity measured 2.8 by 2.3 cm on 06/17/2010. Left hepatic lobe cyst. Musculoskeletal: Thoracic spondylosis. Severe degenerative glenohumeral arthropathy on the right. IMPRESSION: 1. 8.1 cm in diameter hyperdense and enlarging adrenal mass, adrenal malignancy is not excluded. 2. Scattered volume loss and interstitial accentuation at the lung bases with airway thickening, possible bronchitis with superimposed chronic interstitial lung disease. 3. Moderate cardiomegaly with extensive atherosclerosis. 4. Mild adenopathy in the chest is similar to 06/17/2010 and may be crown I chronic reactive adenopathy or nodal congestion due to low grade congestive heart failure. Electronically Signed   By: Van Clines M.D.   On: 05/19/2016 09:53      Scheduled Meds: . acetaminophen  500 mg Oral Daily  . apixaban  2.5 mg Oral BID  . aspirin EC  81 mg Oral Daily  . [START ON 05/21/2016] azithromycin  250 mg Oral Daily  . carvedilol  6.25 mg Oral BID WC  . dextromethorphan-guaiFENesin  1 tablet Oral BID  . fluticasone  2 spray Each Nare Daily  . gabapentin  200 mg Oral TID  . ipratropium-albuterol  3 mL Nebulization TID  . mouth rinse  15 mL Mouth Rinse BID  . polyvinyl alcohol   Both Eyes Daily  . [START ON 05/21/2016] predniSONE  50 mg Oral Q breakfast  . sodium chloride flush  3 mL Intravenous Q12H  . tamsulosin  0.4 mg Oral QHS    Continuous Infusions: . sodium chloride 75 mL/hr at 05/20/16 0603     LOS: 2 days    Time spent in minutes: 23    [place], MD [facility] Pager: [email or web address] Password [email or web address] 05/20/2016, 1:10 PM

## 2016-05-20 NOTE — Progress Notes (Signed)
  Echocardiogram 2D Echocardiogram has been performed.  Arthur Armstrong L Androw 05/20/2016, 12:00 PM

## 2016-05-20 NOTE — Progress Notes (Signed)
Patient temp 100.3 rectally after tylenol 1000 mg total.  On-call MD notified.  Will continue to monitor patient and notify as needed.

## 2016-05-21 DIAGNOSIS — R600 Localized edema: Secondary | ICD-10-CM

## 2016-05-21 DIAGNOSIS — R531 Weakness: Secondary | ICD-10-CM | POA: Diagnosis not present

## 2016-05-21 DIAGNOSIS — J9600 Acute respiratory failure, unspecified whether with hypoxia or hypercapnia: Secondary | ICD-10-CM | POA: Diagnosis not present

## 2016-05-21 DIAGNOSIS — M1710 Unilateral primary osteoarthritis, unspecified knee: Secondary | ICD-10-CM

## 2016-05-21 DIAGNOSIS — R0600 Dyspnea, unspecified: Secondary | ICD-10-CM | POA: Diagnosis not present

## 2016-05-21 DIAGNOSIS — I5020 Unspecified systolic (congestive) heart failure: Secondary | ICD-10-CM | POA: Diagnosis not present

## 2016-05-21 DIAGNOSIS — J209 Acute bronchitis, unspecified: Secondary | ICD-10-CM | POA: Diagnosis not present

## 2016-05-21 DIAGNOSIS — L89150 Pressure ulcer of sacral region, unstageable: Secondary | ICD-10-CM | POA: Diagnosis not present

## 2016-05-21 DIAGNOSIS — R2689 Other abnormalities of gait and mobility: Secondary | ICD-10-CM | POA: Diagnosis not present

## 2016-05-21 DIAGNOSIS — I4891 Unspecified atrial fibrillation: Secondary | ICD-10-CM | POA: Diagnosis not present

## 2016-05-21 DIAGNOSIS — R350 Frequency of micturition: Secondary | ICD-10-CM | POA: Diagnosis not present

## 2016-05-21 DIAGNOSIS — D497 Neoplasm of unspecified behavior of endocrine glands and other parts of nervous system: Secondary | ICD-10-CM | POA: Diagnosis not present

## 2016-05-21 DIAGNOSIS — J208 Acute bronchitis due to other specified organisms: Secondary | ICD-10-CM | POA: Diagnosis not present

## 2016-05-21 DIAGNOSIS — E119 Type 2 diabetes mellitus without complications: Secondary | ICD-10-CM | POA: Diagnosis not present

## 2016-05-21 DIAGNOSIS — I50812 Chronic right heart failure: Secondary | ICD-10-CM

## 2016-05-21 DIAGNOSIS — N179 Acute kidney failure, unspecified: Secondary | ICD-10-CM | POA: Diagnosis not present

## 2016-05-21 DIAGNOSIS — M6281 Muscle weakness (generalized): Secondary | ICD-10-CM | POA: Diagnosis not present

## 2016-05-21 DIAGNOSIS — R2681 Unsteadiness on feet: Secondary | ICD-10-CM | POA: Diagnosis not present

## 2016-05-21 DIAGNOSIS — R278 Other lack of coordination: Secondary | ICD-10-CM | POA: Diagnosis not present

## 2016-05-21 DIAGNOSIS — R0902 Hypoxemia: Secondary | ICD-10-CM | POA: Diagnosis not present

## 2016-05-21 DIAGNOSIS — I9589 Other hypotension: Secondary | ICD-10-CM | POA: Diagnosis not present

## 2016-05-21 DIAGNOSIS — I482 Chronic atrial fibrillation: Secondary | ICD-10-CM | POA: Diagnosis not present

## 2016-05-21 DIAGNOSIS — J9601 Acute respiratory failure with hypoxia: Secondary | ICD-10-CM | POA: Diagnosis not present

## 2016-05-21 DIAGNOSIS — R41841 Cognitive communication deficit: Secondary | ICD-10-CM | POA: Diagnosis not present

## 2016-05-21 LAB — BASIC METABOLIC PANEL
Anion gap: 7 (ref 5–15)
BUN: 44 mg/dL — AB (ref 6–20)
CALCIUM: 9.1 mg/dL (ref 8.9–10.3)
CO2: 25 mmol/L (ref 22–32)
CREATININE: 1.51 mg/dL — AB (ref 0.61–1.24)
Chloride: 102 mmol/L (ref 101–111)
GFR calc Af Amer: 46 mL/min — ABNORMAL LOW (ref >60)
GFR, EST NON AFRICAN AMERICAN: 40 mL/min — AB (ref >60)
GLUCOSE: 193 mg/dL — AB (ref 65–99)
POTASSIUM: 4.3 mmol/L (ref 3.5–5.1)
SODIUM: 134 mmol/L — AB (ref 135–145)

## 2016-05-21 LAB — CBC
HEMATOCRIT: 36.4 % — AB (ref 39.0–52.0)
Hemoglobin: 11.5 g/dL — ABNORMAL LOW (ref 13.0–17.0)
MCH: 29.7 pg (ref 26.0–34.0)
MCHC: 31.6 g/dL (ref 30.0–36.0)
MCV: 94.1 fL (ref 78.0–100.0)
PLATELETS: 129 10*3/uL — AB (ref 150–400)
RBC: 3.87 MIL/uL — ABNORMAL LOW (ref 4.22–5.81)
RDW: 13.6 % (ref 11.5–15.5)
WBC: 8.3 10*3/uL (ref 4.0–10.5)

## 2016-05-21 LAB — VITAMIN B1: Vitamin B1 (Thiamine): 115.6 nmol/L (ref 66.5–200.0)

## 2016-05-21 LAB — GLUCOSE, CAPILLARY
GLUCOSE-CAPILLARY: 151 mg/dL — AB (ref 65–99)
GLUCOSE-CAPILLARY: 209 mg/dL — AB (ref 65–99)

## 2016-05-21 MED ORDER — FUROSEMIDE 40 MG PO TABS: 40.0000 mg | ORAL_TABLET | Freq: Every day | ORAL | 0 refills | Status: DC

## 2016-05-21 MED ORDER — AZITHROMYCIN 250 MG PO TABS: 250.0000 mg | ORAL_TABLET | Freq: Every day | ORAL | 0 refills | Status: DC

## 2016-05-21 MED ORDER — DICLOFENAC SODIUM 1 % TD GEL: 2.0000 g | Freq: Four times a day (QID) | TRANSDERMAL | Status: DC

## 2016-05-21 MED ORDER — VALSARTAN 80 MG PO TABS: 80.0000 mg | ORAL_TABLET | Freq: Every day | ORAL | 0 refills | Status: DC

## 2016-05-21 MED ORDER — PREDNISONE 50 MG PO TABS: ORAL_TABLET | ORAL | Status: DC

## 2016-05-21 MED ORDER — DICLOFENAC SODIUM 1 % TD GEL
2.0000 g | Freq: Four times a day (QID) | TRANSDERMAL | Status: DC
  Administered 2016-05-21: 14:00:00 2 g via TOPICAL
  Filled 2016-05-21: qty 100

## 2016-05-21 MED ORDER — POTASSIUM CHLORIDE CRYS ER 20 MEQ PO TBCR: 40.0000 meq | EXTENDED_RELEASE_TABLET | Freq: Every day | ORAL | Status: DC

## 2016-05-21 NOTE — Progress Notes (Signed)
Patient will discharge to [facility] Anticipated discharge date: 1/16 Family notified: pt dtr at bedside Transportation by [facility]- called at 1:40pm Report #: [id number]  [place] signing off.  Jorge Ny, LCSW Clinical Social Worker [phone]

## 2016-05-21 NOTE — Discharge Summary (Addendum)
Physician Discharge Summary  Arthur Armstrong [id number] DOB: 1926-08-27 DOA: 05/18/2016  PCP: Haywood Pao, MD  Admit date: 05/18/2016 Discharge date: 05/21/2016  Admitted From: home  Disposition:  SNF   Recommendations for Outpatient Follow-up:  1. Follow daily weights- Bmet in 3-4 days- adjsut dose of Lasix as needed to prevent fluid gain or dehydration   Discharge Condition:  stable   CODE STATUS:  Full code   Diet recommendation:  Heart healthy. Low sodium.  Consultations:      Discharge Diagnoses:  Principal Problem:   Acute bronchitis Active Problems:   Acute renal failure superimposed on stage 3 chronic kidney disease (HCC)   Foot pain   Hypotension   Respiratory failure, acute (HCC)   Atrial fibrillation (HCC)   Automatic implantable cardioverter-defibrillator in situ   S/P mitral valve repair   Chronic systolic CHF (congestive heart failure) ([place])   Diabetes mellitus without complication (HCC)   Arthritis of knee   Pedal edema   Chronic right-sided CHF (congestive heart failure)    Subjective: No complaints of feet pain. Right knee pain is improving but not resolved completely. No cough or chest congestion.   Brief Summary: Arthur Armstrong a 83 y.o.malewith medical history significant of hypertension, hyperlipidemia, sCHF with EF 30-35%, s/o of mitral valve repair, PFO, pacemaker, ICD placement, atrial fibrillation on Eliquis, BPH, CKD-3, who presents with cough, shortness of breath and leg pain.   Hospital Course:  Dyspnea and cough- acute bronchitis - dyspnea and cough likely due to upper respiratory infection and resultant bronchitis   - CT chest - no pneumonia -treated with Duoneb and prn albuterol, Mucinex for cough and Z pak - blood cultures NGTD, respiratory virus panel and Flu pcr neg - symptoms much improved now   Hypotension:  - Possibly due to dehydration secondary to recent escalation of diuretics for pedal edema  -IVF: 1.5 L  NS bolus given then 75 cc/hx 24 hrs - BP and Cr both improved - need to be cautious with diuretics as outpt- have discussed other treatments for his pedal edema- see below  AoCKD-III: - Baseline Cre is 1.62 on 02/28/14 - admitted with Cr2.04, BUN 64- Likely prerenal secondary to dehydration/diruetics -IVF given as mentioned as above- Cr improved to 1.51    Chronic systolic CHF, left and right heart -  Clinically dehydrated on admission -Held Lasix due to hypotension and worsening renal function -will resume today- monitor for over diuresis -Continue Coreg - ECHO reveals and EF of 40-45 % with right heart failure - start ARB- PCP wanted to start Valsartan  B/l knee pain- arthritis - has underlying severe arthritis and per his family, they were told he is not a candidate for knee replacement due to his medical issues - b/l swelling of knees noted on exam on admission-very tender to palpation- not able to stand due to pain - started short course of Prednisone- tenderness resolved- pain improved but not resolved completely-  - avoid NSAIDS due to CKD- can give Tylenol which is what he takes at home- family would like to avoid narcotics and I agree - close outpt f/u with orthopedic surgery for Intra-articular injections as needed- have asked family to make a f/u appt for 1 week from now- will give a total of 7 days of Prednisone (taper)  Foot pain- pedal edema - on admission he complained of severe b/l diffuse foot pain limiting his ability to stand  - possibly due to edema of feet - pain  resolved with resolution of edema and feet no longer tender on exam - has good pedal pulses so doubt claudication- DM does not appear to be severe enough to have caused diabetic neuropathy - have prescribed TEDS and advise elevation when swelling becomes severe- despite giving him ~ 3 L of IVF, his pedal edema has not recurred in the hospital   DM-II: - blood sugar 211 on admission   - Chart  review showed that he had A1c 7.0 on 06/17/10 - Hba1c 6.3 now- not on medications at home - sugars currently elevated due to steroid use  HTN: -held lasix due to hypoension -on Coreg for A fib    Atrial Fibrillation:  CHA2DS2-VASc Scoreis 5, needs oral anticoagulation.  - Heart rate is well controlled. -continue Eliquis and coreg  Anemia/ mild thrombocytopenia  - follow - both appear chronic when compared with old labwork   Discharge Instructions  Discharge Instructions    Diet - low sodium heart healthy    Complete by:  As directed    Increase activity slowly    Complete by:  As directed      Allergies as of 05/21/2016   No Known Allergies     Medication List    TAKE these medications   acetaminophen 500 MG tablet Commonly known as:  TYLENOL Take 500 mg by mouth every 6 (six) hours as needed.   azithromycin 250 MG tablet Commonly known as:  ZITHROMAX Take 1 tablet (250 mg total) by mouth daily.   carvedilol 6.25 MG tablet Commonly known as:  COREG TAKE 1 TABLET(6.25 MG) BY MOUTH TWICE DAILY WITH A MEAL   diclofenac sodium 1 % Gel Commonly known as:  VOLTAREN Apply 2 g topically 4 (four) times daily.   ELIQUIS 5 MG Tabs tablet Generic drug:  apixaban TAKE 1 TABLET(5 MG) BY MOUTH EVERY 12 HOURS   FLOMAX 0.4 MG Caps capsule Generic drug:  tamsulosin Take 0.4 mg by mouth at bedtime.   fluticasone 27.5 MCG/SPRAY nasal spray Commonly known as:  VERAMYST Place 2 sprays into the nose daily.   furosemide 40 MG tablet Commonly known as:  LASIX Take 1 tablet (40 mg total) by mouth daily. Take two in the morning then take 1 tablet by mouth in the afternoon What changed:  when to take this   niacin 500 MG CR tablet Commonly known as:  NIASPAN TAKE 1 TABLET BY MOUTH EVERY DAY   potassium chloride SA 20 MEQ tablet Commonly known as:  K-DUR,KLOR-CON Take 2 tablets (40 mEq total) by mouth daily. Take 2 tablets (40 mEq) in the mornings and 1 tablet (20 mEq)  in the evenings What changed:  how much to take  how to take this  when to take this   predniSONE 50 MG tablet Commonly known as:  DELTASONE 40 mg tomorrow- taper by 10 mg daily until finished Start taking on:  05/22/2016   SYSTANE OP Place 1 drop into both eyes daily.   valsartan 80 MG tablet Commonly known as:  DIOVAN Take 1 tablet (80 mg total) by mouth daily.      Contact information for after-discharge care    Destination    [facility] .   Specialty:  [place] information: [place] [phone]             No Known Allergies   Procedures/Studies: 2 D ECHO Left ventricle: Inferior, inferolateral, inferoseptal   hypokinesis. The  cavity size was normal. Wall thickness was   increased in a pattern of mild LVH. Systolic function was mildly   to moderately reduced. The estimated ejection fraction was in the   range of 40% to 45%. - Mitral valve: s/p MV repair. Calcified annulus. Mildly thickened   leaflets . There was mild regurgitation. - Left atrium: The atrium was mildly dilated. - Right ventricle: The cavity size was moderately dilated. Wall   thickness was normal. Systolic function was mildly to moderately   reduced. - Right atrium: The atrium was severely dilated. - Pulmonary arteries: PA peak pressure: 66 mm Hg (S).   Dg Chest 2 View  Result Date: 05/18/2016 CLINICAL DATA:  Pneumonia. Recently diagnosed pneumonia and CHF. Cough for the past week. Shortness of breath. EXAM: CHEST  2 VIEW COMPARISON:  02/27/2014. FINDINGS: Stable enlarged cardiac silhouette, median sternotomy wires, prosthetic mitral valve and left subclavian pacer and AICD leads. Stable prominence of the interstitial markings. Otherwise, clear lungs. Diffuse osteopenia. Right shoulder degenerative changes thoracic spine degenerative changes. Aortic calcification. IMPRESSION: 1. No acute  abnormality. 2. Stable cardiomegaly, chronic interstitial lung disease and aortic atherosclerosis. Electronically Signed   By: Claudie Revering M.D.   On: 05/18/2016 16:33   Ct Chest Wo Contrast  Result Date: 05/19/2016 CLINICAL DATA:  Cough.  Hypoxia. EXAM: CT CHEST WITHOUT CONTRAST TECHNIQUE: Multidetector CT imaging of the chest was performed following the standard protocol without IV contrast. COMPARISON:  06/17/2010 FINDINGS: Cardiovascular: Coronary, aortic arch, and branch vessel atherosclerotic vascular disease. Pacer leads noted. Prosthetic mitral valve. Moderate cardiomegaly. Mediastinum/Nodes: Scattered mildly enlarged lymph nodes, including a 1.7 cm in short axis right lower paratracheal node with a fatty hilum (formerly the same) and a lymph node anterior to the left pulmonary artery measuring 1 cm 1.2 cm in short axis (formerly 1.5 cm). Lungs/Pleura: Mild peripheral interstitial accentuation especially at the lung bases, with bilateral airway thickening. Mild atelectasis and slightly more coarse interstitial accentuation in the lower lobes and lower portions of the lingula and right middle lobe. Upper Abdomen: We partially include a 8.1 by 7.0 mildly heterogeneous hyperdense lesion fairly closely associated with the pancreas, adrenal gland, and splenic vasculature. A prior adrenal mass in this vicinity measured 2.8 by 2.3 cm on 06/17/2010. Left hepatic lobe cyst. Musculoskeletal: Thoracic spondylosis. Severe degenerative glenohumeral arthropathy on the right. IMPRESSION: 1. 8.1 cm in diameter hyperdense and enlarging adrenal mass, adrenal malignancy is not excluded. 2. Scattered volume loss and interstitial accentuation at the lung bases with airway thickening, possible bronchitis with superimposed chronic interstitial lung disease. 3. Moderate cardiomegaly with extensive atherosclerosis. 4. Mild adenopathy in the chest is similar to 06/17/2010 and may be crown I chronic reactive adenopathy or nodal  congestion due to low grade congestive heart failure. Electronically Signed   By: Van Clines M.D.   On: 05/19/2016 09:53       Discharge Exam: Vitals:   05/21/16 0446 05/21/16 0746  BP: 109/72 121/77  Pulse: 65   Resp: 16   Temp: 97.5 F (36.4 C)    Vitals:   05/20/16 2138 05/21/16 0231 05/21/16 0446 05/21/16 0746  BP: 102/67  109/72 121/77  Pulse: (!) 59  65   Resp: 18  16   Temp: 97.3 F (36.3 C)  97.5 F (36.4 C)   TempSrc: Oral  Oral   SpO2: 100%  96%   Weight:  79.8 kg (175 lb 14.8 oz)    Height:  General: Pt is alert, awake, not in acute distress Cardiovascular: RRR, S1/S2 +, no rubs, no gallops Respiratory: CTA bilaterally, no wheezing, no rhonchi Abdominal: Soft, NT, ND, bowel sounds + Extremities: no edema, no cyanosis    The results of significant diagnostics from this hospitalization (including imaging, microbiology, ancillary and laboratory) are listed below for reference.     Microbiology: Recent Results (from the past 240 hour(s))  Culture, blood (x 2)     Status: None (Preliminary result)   Collection Time: 05/19/16 12:40 AM  Result Value Ref Range Status   Specimen Description BLOOD BLOOD RIGHT FOREARM  Final   Special Requests BOTTLES DRAWN AEROBIC AND ANAEROBIC 5CC  Final   Culture NO GROWTH 1 DAY  Final   Report Status PENDING  Incomplete  Culture, blood (x 2)     Status: None (Preliminary result)   Collection Time: 05/19/16  3:24 AM  Result Value Ref Range Status   Specimen Description BLOOD RIGHT ARM  Final   Special Requests BOTTLES DRAWN AEROBIC AND ANAEROBIC 5CC  Final   Culture NO GROWTH 1 DAY  Final   Report Status PENDING  Incomplete  Respiratory Panel by PCR     Status: None   Collection Time: 05/19/16  9:19 AM  Result Value Ref Range Status   Adenovirus NOT DETECTED NOT DETECTED Final   Coronavirus 229E NOT DETECTED NOT DETECTED Final   Coronavirus HKU1 NOT DETECTED NOT DETECTED Final   Coronavirus NL63 NOT  DETECTED NOT DETECTED Final   Coronavirus OC43 NOT DETECTED NOT DETECTED Final   Metapneumovirus NOT DETECTED NOT DETECTED Final   Rhinovirus / Enterovirus NOT DETECTED NOT DETECTED Final   Influenza A NOT DETECTED NOT DETECTED Final   Influenza B NOT DETECTED NOT DETECTED Final   Parainfluenza Virus 1 NOT DETECTED NOT DETECTED Final   Parainfluenza Virus 2 NOT DETECTED NOT DETECTED Final   Parainfluenza Virus 3 NOT DETECTED NOT DETECTED Final   Parainfluenza Virus 4 NOT DETECTED NOT DETECTED Final   Respiratory Syncytial Virus NOT DETECTED NOT DETECTED Final   Bordetella pertussis NOT DETECTED NOT DETECTED Final   Chlamydophila pneumoniae NOT DETECTED NOT DETECTED Final   Mycoplasma pneumoniae NOT DETECTED NOT DETECTED Final     Labs: BNP (last 3 results)  Recent Labs  05/18/16 2025  BNP [id number]*   Basic Metabolic Panel:  Recent Labs Lab 05/18/16 2024 05/19/16 0846 05/19/16 1243 05/20/16 0429 05/21/16 0503  NA 134* 137  --  136 134*  K 4.2 4.1  --  4.1 4.3  CL 94* 101  --  101 102  CO2 27 28  --  27 25  GLUCOSE 211* 135*  --  111* 193*  BUN 64* 53*  --  52* 44*  CREATININE 2.04* 1.63*  --  1.72* 1.51*  CALCIUM 9.2 8.9  --  8.8* 9.1  MG  --   --  2.5*  --   --    Liver Function Tests:  Recent Labs Lab 05/18/16 2024  AST 18  ALT 14*  ALKPHOS 97  BILITOT 1.0  PROT 7.5  ALBUMIN 4.0   No results for input(s): LIPASE, AMYLASE in the last 168 hours. No results for input(s): AMMONIA in the last 168 hours. CBC:  Recent Labs Lab 05/18/16 2024 05/20/16 0429 05/21/16 0503  WBC 8.2 8.7 8.3  HGB 13.4 11.6* 11.5*  HCT 41.5 36.7* 36.4*  MCV 94.5 94.6 94.1  PLT 156 118* 129*   Cardiac Enzymes:  Recent Labs  Lab 05/19/16 0040 05/19/16 0620 05/19/16 1243  TROPONINI 0.03* 0.03* 0.04*   BNP: Invalid input(s): POCBNP CBG:  Recent Labs Lab 05/20/16 0847 05/20/16 1251 05/20/16 1749 05/20/16 2256 05/21/16 0750  GLUCAP 109* 190* 238* 285* 151*    D-Dimer No results for input(s): DDIMER in the last 72 hours. Hgb A1c  Recent Labs  05/19/16 0330  HGBA1C 6.3*   Lipid Profile No results for input(s): CHOL, HDL, LDLCALC, TRIG, CHOLHDL, LDLDIRECT in the last 72 hours. Thyroid function studies  Recent Labs  05/19/16 0330  TSH 3.897   Anemia work up No results for input(s): VITAMINB12, FOLATE, FERRITIN, TIBC, IRON, RETICCTPCT in the last 72 hours. Urinalysis    Component Value Date/Time   COLORURINE YELLOW 05/19/2016 0310   APPEARANCEUR CLEAR 05/19/2016 0310   LABSPEC 1.012 05/19/2016 0310   PHURINE 5.0 05/19/2016 0310   GLUCOSEU NEGATIVE 05/19/2016 0310   HGBUR SMALL (A) 05/19/2016 0310   BILIRUBINUR NEGATIVE 05/19/2016 0310   KETONESUR NEGATIVE 05/19/2016 0310   PROTEINUR NEGATIVE 05/19/2016 0310   UROBILINOGEN 0.2 02/27/2014 1343   NITRITE NEGATIVE 05/19/2016 0310   LEUKOCYTESUR NEGATIVE 05/19/2016 0310   Sepsis Labs Invalid input(s): PROCALCITONIN,  WBC,  LACTICIDVEN Microbiology Recent Results (from the past 240 hour(s))  Culture, blood (x 2)     Status: None (Preliminary result)   Collection Time: 05/19/16 12:40 AM  Result Value Ref Range Status   Specimen Description BLOOD BLOOD RIGHT FOREARM  Final   Special Requests BOTTLES DRAWN AEROBIC AND ANAEROBIC 5CC  Final   Culture NO GROWTH 1 DAY  Final   Report Status PENDING  Incomplete  Culture, blood (x 2)     Status: None (Preliminary result)   Collection Time: 05/19/16  3:24 AM  Result Value Ref Range Status   Specimen Description BLOOD RIGHT ARM  Final   Special Requests BOTTLES DRAWN AEROBIC AND ANAEROBIC 5CC  Final   Culture NO GROWTH 1 DAY  Final   Report Status PENDING  Incomplete  Respiratory Panel by PCR     Status: None   Collection Time: 05/19/16  9:19 AM  Result Value Ref Range Status   Adenovirus NOT DETECTED NOT DETECTED Final   Coronavirus 229E NOT DETECTED NOT DETECTED Final   Coronavirus HKU1 NOT DETECTED NOT DETECTED Final    Coronavirus NL63 NOT DETECTED NOT DETECTED Final   Coronavirus OC43 NOT DETECTED NOT DETECTED Final   Metapneumovirus NOT DETECTED NOT DETECTED Final   Rhinovirus / Enterovirus NOT DETECTED NOT DETECTED Final   Influenza A NOT DETECTED NOT DETECTED Final   Influenza B NOT DETECTED NOT DETECTED Final   Parainfluenza Virus 1 NOT DETECTED NOT DETECTED Final   Parainfluenza Virus 2 NOT DETECTED NOT DETECTED Final   Parainfluenza Virus 3 NOT DETECTED NOT DETECTED Final   Parainfluenza Virus 4 NOT DETECTED NOT DETECTED Final   Respiratory Syncytial Virus NOT DETECTED NOT DETECTED Final   Bordetella pertussis NOT DETECTED NOT DETECTED Final   Chlamydophila pneumoniae NOT DETECTED NOT DETECTED Final   Mycoplasma pneumoniae NOT DETECTED NOT DETECTED Final     Time coordinating discharge: Over 30 minutes  SIGNED:   Debbe Odea, MD  [facility] 05/21/2016, 12:04 PM Pager   If 7PM-7AM, please contact night-coverage [email or web address] Password [email or web address]

## 2016-05-21 NOTE — Clinical Social Work Placement (Signed)
   CLINICAL SOCIAL WORK PLACEMENT  NOTE  Date:  05/21/2016  Patient Details  Name: Arthur Armstrong MRN: [id number] Date of Birth: 1927-03-20  Clinical Social Work is seeking post-discharge placement for this patient at the [place] level of care (*CSW will initial, date and re-position this form in  chart as items are completed):  Yes   Patient/family provided with [place] Work Department's list of facilities offering this level of care within the geographic area requested by the patient (or if unable, by the patient's family).  Yes   Patient/family informed of their freedom to choose among providers that offer the needed level of care, that participate in Medicare, Medicaid or managed care program needed by the patient, have an available bed and are willing to accept the patient.  Yes   Patient/family informed of [facility]'s ownership interest in [facility] and [facility], as well as of the fact that they are under no obligation to receive care at these facilities.  PASRR submitted to EDS on       PASRR number received on       Existing PASRR number confirmed on 05/21/16     FL2 transmitted to all facilities in geographic area requested by pt/family on 05/21/16     FL2 transmitted to all facilities within larger geographic area on       Patient informed that his/her managed care company has contracts with or will negotiate with certain facilities, including the following:        Yes   Patient/family informed of bed offers received.  Patient chooses bed at [place], [place]     Physician recommends and patient chooses bed at      Patient to be transferred to [place] on 05/21/16.  Patient to be transferred to facility by       Patient family notified on   of transfer.  Name of family member notified:        PHYSICIAN Please sign FL2     Additional Comment:     _______________________________________________ Jorge Ny, LCSW 05/21/2016, 1:46 PM

## 2016-05-21 NOTE — NC FL2 (Signed)
[place] LEVEL OF CARE SCREENING TOOL     IDENTIFICATION  Patient Name: Arthur Armstrong Birthdate: 12/24/1926 Sex: male Admission Date (Current Location): 05/18/2016  [facility] and [place] Number:  Herbalist and Address:  [facility] [place], [place] [address] [place], [place] [address]      Provider Number: [id number]  Attending Physician Name and Address:  Debbe Odea, MD  Relative Name and Phone Number:       Current Level of Care: Hospital Recommended Level of Care: [place] Prior Approval Number:    Date Approved/Denied:   PASRR Number: [id number]  Discharge Plan: SNF    Current Diagnoses: Patient Active Problem List   Diagnosis Date Noted  . Peripheral neuropathy ([place]) 05/19/2016  . Cough 05/19/2016  . Dyspnea 05/19/2016  . Respiratory failure, acute ([place]) 05/19/2016  . Acute renal failure superimposed on stage 3 chronic kidney disease ([place]) 05/18/2016  . Foot pain 05/18/2016  . Diabetes mellitus without complication ([place]) [id number]  . Hypotension 05/18/2016  . Nonischemic cardiomyopathy ([place]) 04/03/2012  . Chronic systolic CHF (congestive heart failure) ([place]) 04/02/2012  . Hypoxemia 04/01/2012  . Syncope 03/31/2012  . S/P mitral valve repair   . Dyslipidemia 02/04/2012  . Atrial fibrillation ([place]) 09/21/2009  . Essential hypertension 11/04/2008  . Automatic implantable cardioverter-defibrillator in situ 11/04/2008    Orientation RESPIRATION BLADDER Height & Weight     Self, Time, Situation, Place  O2 (2L [place]) Continent Weight: 175 lb 14.8 oz (79.8 kg) Height:  5\' 6"  (167.6 cm)  BEHAVIORAL SYMPTOMS/MOOD NEUROLOGICAL BOWEL NUTRITION STATUS      Continent Diet (cardiac/carb modified)  AMBULATORY STATUS COMMUNICATION OF NEEDS Skin   Extensive Assist Verbally Normal                       Personal Care Assistance Level of Assistance  Bathing, Dressing Bathing Assistance: Maximum assistance   Dressing Assistance: Maximum assistance     Functional Limitations Info             SPECIAL CARE FACTORS FREQUENCY  PT (By licensed PT), OT (By licensed OT)     PT Frequency: 5/wk OT Frequency: 5/wk            Contractures      Additional Factors Info  Code Status, Allergies Code Status Info: FULL Allergies Info: NKA           Current Medications (05/21/2016):  This is the current hospital active medication list Current Facility-Administered Medications  Medication Dose Route Frequency Provider Last Rate Last Dose  . acetaminophen (TYLENOL) tablet 500 mg  500 mg Oral Daily Debbe Odea, MD   500 mg at 05/20/16 1020  . acetaminophen (TYLENOL) tablet 500 mg  500 mg Oral Q6H PRN Debbe Odea, MD   500 mg at 05/20/16 2139  . albuterol (PROVENTIL) (2.5 MG/3ML) 0.083% nebulizer solution 2.5 mg  2.5 mg Nebulization Q4H PRN Ivor Costa, MD      . apixaban [place]) tablet 2.5 mg  2.5 mg Oral BID Ivor Costa, MD   2.5 mg at 05/20/16 2139  . aspirin EC tablet 81 mg  81 mg Oral Daily Ivor Costa, MD   81 mg at 05/20/16 0926  . azithromycin (ZITHROMAX) tablet 250 mg  250 mg Oral Daily Saima Rizwan, MD      . carvedilol (COREG) tablet 6.25 mg  6.25 mg Oral BID WC Ivor Costa, MD   6.25 mg at  05/21/16 0747  . dextromethorphan-guaiFENesin (MUCINEX DM) 30-600 MG per 12 hr tablet 1 tablet  1 tablet Oral BID Ivor Costa, MD   1 tablet at 05/20/16 2139  . fluticasone (FLONASE) 50 MCG/ACT nasal spray 2 spray  2 spray Each Nare Daily Ivor Costa, MD   2 spray at 05/20/16 0929  . gabapentin (NEURONTIN) capsule 200 mg  200 mg Oral TID Ivor Costa, MD   200 mg at 05/20/16 2139  . ipratropium-albuterol (DUONEB) 0.5-2.5 (3) MG/3ML nebulizer solution 3 mL  3 mL Nebulization TID Debbe Odea, MD   3 mL at 05/20/16 1935  . MEDLINE mouth rinse  15 mL Mouth Rinse BID Debbe Odea, MD   15 mL at 05/20/16 2200  . ondansetron (ZOFRAN) tablet 4 mg  4 mg Oral Q6H PRN Ivor Costa, MD       Or  . ondansetron [facility])  injection 4 mg  4 mg Intravenous Q6H PRN Ivor Costa, MD      . polyvinyl alcohol (LIQUIFILM TEARS) 1.4 % ophthalmic solution   Both Eyes Daily Ivor Costa, MD      . predniSONE (DELTASONE) tablet 50 mg  50 mg Oral Q breakfast Debbe Odea, MD   50 mg at 05/21/16 0747  . sodium chloride flush (NS) 0.9 % injection 3 mL  3 mL Intravenous Q12H Ivor Costa, MD   3 mL at 05/20/16 2140  . tamsulosin (FLOMAX) capsule 0.4 mg  0.4 mg Oral QHS Ivor Costa, MD   0.4 mg at 05/20/16 2139  . zolpidem (AMBIEN) tablet 5 mg  5 mg Oral QHS PRN Ivor Costa, MD         Discharge Medications: Please see discharge summary for a list of discharge medications.  Relevant Imaging Results:  Relevant Lab Results:   Additional Information SS#: [id number]  Jorge Ny, LCSW

## 2016-05-21 NOTE — Discharge Instructions (Signed)

## 2016-05-21 NOTE — Progress Notes (Signed)
Patient was discharged to nursing home [facility]  by MD order; discharged instructions  review and sent to facility with care notes; IV DIC; facility was called and report was given to nurse who is going to receive the patient; family at bedside; patient will be transported to facility via [place].

## 2016-05-21 NOTE — Care Management Note (Signed)
Case Management Note  Patient Details  Name: BRYXTON FORSHEY MRN: [id number] Date of Birth: July 28, 1926  Subjective/Objective:               Patient admitted from home for dyspnea.    Action/Plan:  Will DC to SNF today as facilitated by CSW.   Expected Discharge Date:  05/21/16               Expected Discharge Plan:  Skilled Nursing Facility  In-House Referral:  Clinical Social Work  Discharge planning Services  CM Consult  Post Acute Care Choice:    Choice offered to:     DME Arranged:    DME Agency:     HH Arranged:    [place] Agency:     Status of Service:  Completed, signed off  If discussed at [employer], dates discussed:    Additional Comments:  Carles Collet, RN 05/21/2016, 9:58 AM

## 2016-05-21 NOTE — Progress Notes (Signed)
Physical Therapy Treatment Patient Details Name: Arthur Armstrong MRN: [id number] DOB: [birth year] Today's Date: 05/21/2016    History of Present Illness Pt is an 83 y/o male who presents with onset of bilateral knee and foot pain, as well as SOB. Pt on supplemental O2 at home 24 hours.     PT Comments    Pt progressing towards physical therapy goals. Was able to perform transfers with less assist this session, however continues to be significantly limited by pain. Pt was able to complete minimal exercise in chair, however demonstrated an overall good rehab effort. Will continue to follow and progress as able per POC.   Follow Up Recommendations  SNF;Supervision/Assistance - 24 hour     Equipment Recommendations  3in1 (PT)    Recommendations for Other Services       Precautions / Restrictions Precautions Precautions: Fall Restrictions Weight Bearing Restrictions: No    Mobility  Bed Mobility Overal bed mobility: Needs Assistance Bed Mobility: Supine to Sit;Sit to Supine     Supine to sit: Min assist     General bed mobility comments: Assist for trunk elevation to full sitting position. Pt was able to advance LE's out to EOB. Bed pad used to complete scooting so feet were on floor.   Transfers Overall transfer level: Needs assistance Equipment used: Rolling walker (2 wheeled) Transfers: Sit to/from [facility] Sit to Stand: Min assist;+2 physical assistance;From elevated surface Stand pivot transfers: Min assist;+2 physical assistance;+2 safety/equipment       General transfer comment: Assist to power-up to full standing position. Bed height was raised for ease of transfer. Pt walking feet out in front of him seemingly to avoid full knee extension in standing. Noted weight shift to the L side to attempt to off weight the R knee which pt states is the most painful. Pt was able to correct and get feet underneath hips, and was able to take a few pivotal  steps around to the recliner chair.   Ambulation/Gait             General Gait Details: Unable due to pain   Stairs            Wheelchair Mobility    Modified Rankin (Stroke Patients Only)       Balance Overall balance assessment: Needs assistance Sitting-balance support: Feet supported;No upper extremity supported Sitting balance-Leahy Scale: Fair     Standing balance support: Bilateral upper extremity supported;During functional activity Standing balance-Leahy Scale: Poor                      Cognition Arousal/Alertness: Awake/alert Behavior During Therapy: WFL for tasks assessed/performed Overall Cognitive Status: Within Functional Limits for tasks assessed                      Exercises General Exercises - Lower Extremity Ankle Circles/Pumps: 10 reps Quad Sets: 10 reps;Left Long Arc Quad: 10 reps Hip ABduction/ADduction: 10 reps    General Comments        Pertinent Vitals/Pain Pain Assessment: Faces Faces Pain Scale: Hurts whole lot Pain Location: Knees/feet Pain Descriptors / Indicators: Numbness;Tender Pain Intervention(s): Limited activity within patient's tolerance;Monitored during session;Repositioned    Home Living                      Prior Function            PT Goals (current goals can now be found in the  care plan section) Acute Rehab PT Goals Patient Stated Goal: Decrease pain PT Goal Formulation: With patient Time For Goal Achievement: 06/03/16 Potential to Achieve Goals: Good Progress towards PT goals: Progressing toward goals    Frequency    Min 3X/week      PT Plan Current plan remains appropriate    Co-evaluation             End of Session Equipment Utilized During Treatment: Gait belt;Oxygen Activity Tolerance: Patient limited by pain Patient left: in chair;with call bell/phone within reach;with family/visitor present;with chair alarm set     Time: [id number] PT Time  Calculation (min) (ACUTE ONLY): 25 min  Charges:  $Gait Training: 8-22 mins $Therapeutic Exercise: 8-22 mins                    G Codes:      Thelma Comp 2016-06-07, 2:01 PM  Rolinda Roan, PT, DPT Acute Rehabilitation Services Pager: [phone]

## 2016-05-24 LAB — CULTURE, BLOOD (ROUTINE X 2)
CULTURE: NO GROWTH
CULTURE: NO GROWTH

## 2016-05-27 ENCOUNTER — Telehealth: Payer: Self-pay | Admitting: Internal Medicine

## 2016-05-27 NOTE — Telephone Encounter (Signed)
New Message  Pts daughter voiced the next avail date in March is too far out and would like for someone to reach back out to her.  Please f/u

## 2016-05-28 ENCOUNTER — Encounter: Payer: Medicare Other | Admitting: Internal Medicine

## 2016-05-30 ENCOUNTER — Other Ambulatory Visit: Payer: Self-pay | Admitting: Internal Medicine

## 2016-06-03 DIAGNOSIS — R350 Frequency of micturition: Secondary | ICD-10-CM | POA: Diagnosis not present

## 2016-06-03 DIAGNOSIS — D497 Neoplasm of unspecified behavior of endocrine glands and other parts of nervous system: Secondary | ICD-10-CM | POA: Diagnosis not present

## 2016-06-04 DIAGNOSIS — L89150 Pressure ulcer of sacral region, unstageable: Secondary | ICD-10-CM | POA: Diagnosis not present

## 2016-06-06 DIAGNOSIS — M1711 Unilateral primary osteoarthritis, right knee: Secondary | ICD-10-CM | POA: Diagnosis not present

## 2016-06-08 DIAGNOSIS — Z950 Presence of cardiac pacemaker: Secondary | ICD-10-CM | POA: Diagnosis not present

## 2016-06-08 DIAGNOSIS — M6281 Muscle weakness (generalized): Secondary | ICD-10-CM | POA: Diagnosis not present

## 2016-06-08 DIAGNOSIS — Z7901 Long term (current) use of anticoagulants: Secondary | ICD-10-CM | POA: Diagnosis not present

## 2016-06-08 DIAGNOSIS — I130 Hypertensive heart and chronic kidney disease with heart failure and stage 1 through stage 4 chronic kidney disease, or unspecified chronic kidney disease: Secondary | ICD-10-CM | POA: Diagnosis not present

## 2016-06-08 DIAGNOSIS — L89152 Pressure ulcer of sacral region, stage 2: Secondary | ICD-10-CM | POA: Diagnosis not present

## 2016-06-08 DIAGNOSIS — N183 Chronic kidney disease, stage 3 (moderate): Secondary | ICD-10-CM | POA: Diagnosis not present

## 2016-06-08 DIAGNOSIS — I5022 Chronic systolic (congestive) heart failure: Secondary | ICD-10-CM | POA: Diagnosis not present

## 2016-06-08 DIAGNOSIS — M170 Bilateral primary osteoarthritis of knee: Secondary | ICD-10-CM | POA: Diagnosis not present

## 2016-06-08 DIAGNOSIS — E1142 Type 2 diabetes mellitus with diabetic polyneuropathy: Secondary | ICD-10-CM | POA: Diagnosis not present

## 2016-06-08 DIAGNOSIS — I4891 Unspecified atrial fibrillation: Secondary | ICD-10-CM | POA: Diagnosis not present

## 2016-06-08 DIAGNOSIS — E1122 Type 2 diabetes mellitus with diabetic chronic kidney disease: Secondary | ICD-10-CM | POA: Diagnosis not present

## 2016-06-08 DIAGNOSIS — Z8709 Personal history of other diseases of the respiratory system: Secondary | ICD-10-CM | POA: Diagnosis not present

## 2016-06-11 DIAGNOSIS — L89152 Pressure ulcer of sacral region, stage 2: Secondary | ICD-10-CM | POA: Diagnosis not present

## 2016-06-11 DIAGNOSIS — R634 Abnormal weight loss: Secondary | ICD-10-CM | POA: Diagnosis not present

## 2016-06-11 DIAGNOSIS — I130 Hypertensive heart and chronic kidney disease with heart failure and stage 1 through stage 4 chronic kidney disease, or unspecified chronic kidney disease: Secondary | ICD-10-CM | POA: Diagnosis not present

## 2016-06-11 DIAGNOSIS — M6281 Muscle weakness (generalized): Secondary | ICD-10-CM | POA: Diagnosis not present

## 2016-06-11 DIAGNOSIS — I420 Dilated cardiomyopathy: Secondary | ICD-10-CM | POA: Diagnosis not present

## 2016-06-11 DIAGNOSIS — M170 Bilateral primary osteoarthritis of knee: Secondary | ICD-10-CM | POA: Diagnosis not present

## 2016-06-11 DIAGNOSIS — D497 Neoplasm of unspecified behavior of endocrine glands and other parts of nervous system: Secondary | ICD-10-CM | POA: Diagnosis not present

## 2016-06-11 DIAGNOSIS — E1122 Type 2 diabetes mellitus with diabetic chronic kidney disease: Secondary | ICD-10-CM | POA: Diagnosis not present

## 2016-06-11 DIAGNOSIS — I5022 Chronic systolic (congestive) heart failure: Secondary | ICD-10-CM | POA: Diagnosis not present

## 2016-06-11 DIAGNOSIS — D3502 Benign neoplasm of left adrenal gland: Secondary | ICD-10-CM | POA: Diagnosis not present

## 2016-06-12 DIAGNOSIS — I130 Hypertensive heart and chronic kidney disease with heart failure and stage 1 through stage 4 chronic kidney disease, or unspecified chronic kidney disease: Secondary | ICD-10-CM | POA: Diagnosis not present

## 2016-06-12 DIAGNOSIS — M6281 Muscle weakness (generalized): Secondary | ICD-10-CM | POA: Diagnosis not present

## 2016-06-12 DIAGNOSIS — M170 Bilateral primary osteoarthritis of knee: Secondary | ICD-10-CM | POA: Diagnosis not present

## 2016-06-12 DIAGNOSIS — L89152 Pressure ulcer of sacral region, stage 2: Secondary | ICD-10-CM | POA: Diagnosis not present

## 2016-06-12 DIAGNOSIS — E1122 Type 2 diabetes mellitus with diabetic chronic kidney disease: Secondary | ICD-10-CM | POA: Diagnosis not present

## 2016-06-12 DIAGNOSIS — I5022 Chronic systolic (congestive) heart failure: Secondary | ICD-10-CM | POA: Diagnosis not present

## 2016-06-13 ENCOUNTER — Other Ambulatory Visit: Payer: Self-pay

## 2016-06-13 DIAGNOSIS — I5022 Chronic systolic (congestive) heart failure: Secondary | ICD-10-CM | POA: Diagnosis not present

## 2016-06-13 DIAGNOSIS — E1122 Type 2 diabetes mellitus with diabetic chronic kidney disease: Secondary | ICD-10-CM | POA: Diagnosis not present

## 2016-06-13 DIAGNOSIS — M170 Bilateral primary osteoarthritis of knee: Secondary | ICD-10-CM | POA: Diagnosis not present

## 2016-06-13 DIAGNOSIS — M6281 Muscle weakness (generalized): Secondary | ICD-10-CM | POA: Diagnosis not present

## 2016-06-13 DIAGNOSIS — I130 Hypertensive heart and chronic kidney disease with heart failure and stage 1 through stage 4 chronic kidney disease, or unspecified chronic kidney disease: Secondary | ICD-10-CM | POA: Diagnosis not present

## 2016-06-13 DIAGNOSIS — L89152 Pressure ulcer of sacral region, stage 2: Secondary | ICD-10-CM | POA: Diagnosis not present

## 2016-06-14 ENCOUNTER — Ambulatory Visit (INDEPENDENT_AMBULATORY_CARE_PROVIDER_SITE_OTHER): Payer: Medicare Other | Admitting: Internal Medicine

## 2016-06-14 ENCOUNTER — Encounter: Payer: Self-pay | Admitting: Internal Medicine

## 2016-06-14 VITALS — BP 88/60 | HR 74 | Ht 66.0 in | Wt 161.8 lb

## 2016-06-14 DIAGNOSIS — I428 Other cardiomyopathies: Secondary | ICD-10-CM | POA: Diagnosis not present

## 2016-06-14 DIAGNOSIS — Z95810 Presence of automatic (implantable) cardiac defibrillator: Secondary | ICD-10-CM

## 2016-06-14 NOTE — Progress Notes (Signed)
HPI Mr. Headlee returns today for followup. He is a very pleasant 83 year old man with a history of chronic atrial fibrillation, complete heart block, chronic systolic heart failure, status post biventricular ICD implantation. He has underlying nonischemic cardiomyopathy. His heart failure symptoms have been class II. He has had some arthritic problems and received prednisone injections 2 weeks ago. He was immobile for about a week and now notes fatigue, weakness and a lack of energy. He was seen in primary MD office yesterday. Labs were ok. His CXR was interpreted as no change from prior. He has been found to have an adrenal mass and is undergoing workup.  No Known Allergies   Current Outpatient Prescriptions  Medication Sig Dispense Refill  . acetaminophen (TYLENOL) 500 MG tablet Take 500 mg by mouth every 6 (six) hours as needed.    Marland Kitchen azithromycin (ZITHROMAX) 250 MG tablet Take 1 tablet (250 mg total) by mouth daily. 4 tablet 0  . carvedilol (COREG) 6.25 MG tablet TAKE 1 TABLET(6.25 MG) BY MOUTH TWICE DAILY WITH A MEAL 180 tablet 1  . diclofenac sodium (VOLTAREN) 1 % GEL Apply 2 g topically 4 (four) times daily.    Marland Kitchen ELIQUIS 5 MG TABS tablet TAKE 1 TABLET(5 MG) BY MOUTH EVERY 12 HOURS 60 tablet 8  . fluticasone (VERAMYST) 27.5 MCG/SPRAY nasal spray Place 2 sprays into the nose daily.    . furosemide (LASIX) 40 MG tablet Take 1 tablet (40 mg total) by mouth daily. Take two in the morning then take 1 tablet by mouth in the afternoon 30 tablet 0  . niacin (NIASPAN) 500 MG CR tablet TAKE 1 TABLET BY MOUTH EVERY DAY (Patient not taking: Reported on 05/18/2016) 90 tablet 3  . Polyethyl Glycol-Propyl Glycol (SYSTANE OP) Place 1 drop into both eyes daily.    . potassium chloride SA (K-DUR,KLOR-CON) 20 MEQ tablet Take 2 tablets (40 mEq total) by mouth daily. Take 2 tablets (40 mEq) in the mornings and 1 tablet (20 mEq) in the evenings    . predniSONE (DELTASONE) 50 MG tablet 40 mg tomorrow- taper by 10 mg  daily until finished    . Tamsulosin HCl (FLOMAX) 0.4 MG CAPS Take 0.4 mg by mouth at bedtime.      . valsartan (DIOVAN) 80 MG tablet Take 1 tablet (80 mg total) by mouth daily. 30 tablet 0   No current facility-administered medications for this visit.      Past Medical History:  Diagnosis Date  . Atrial fibrillation ([place])   . Automatic implantable cardiac defibrillator in situ    BiV pacer  . Chronic systolic CHF (congestive heart failure) ([place])   . Coronary artery disease, non-occlusive    cath 2006 pLM 20%, pLAD 40%; mCFX 20%  . Hernia, ventral   . HLD (hyperlipidemia)   . HTN (hypertension)   . ICD (implantable cardiac defibrillator) in place    BIV ICD  . NICM (nonischemic cardiomyopathy) ([place])    nicm ef 30-35%,nonob cad-cath 2006,echo 2/12:ef30-35%.mod lvh,inf hk,mild-mod lae and mild rae,pasp 35  . Pacemaker    BIV ICD  . PFO (patent foramen ovale)    s/p repair 2006  . S/P mitral valve repair    2006  . Shortness of breath   . Systolic CHF ([place])   . Unspecified essential hypertension     ROS:   All systems reviewed and negative except as noted in the HPI.   Past Surgical History:  Procedure Laterality Date  . BILATERAL KNEE ARTHROSCOPY    .  COLONOSCOPY WITH POLYPECTOMY    . HIP PINNING  3/2/012  . IMPLANTATION OF A DUAL CHAMBER BIVENTRICULAR ICD    . INTRAOPERATIVE TRANSESOPHAGEAL ECHOCARDIOGRAM    . MEDIAN STEROTOMY FOR MITRAL VALVE REPAIR    . RT. SHOULDER       Family History  Problem Relation Age of Onset  . Heart attack Mother   . Heart attack Father   . Heart disease Neg Hx     Rheumatic or valvular heart disease   . Cardiomyopathy Neg Hx      Social History   Social History  . Marital status: Married    Spouse name: N/A  . Number of children: 6  . Years of education: N/A   Occupational History  . 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 Smoking status: Former  Smoker    Packs/day: 1.00    Years: 5.00    Quit date: 05/06/1961  . Smokeless tobacco: Former Systems developer    Quit date: 05/07/1951  . Alcohol use No  . Drug use: No  . Sexual activity: Not Currently   Other Topics Concern  . Not on file   Social History Narrative   Lives with his wife in [place].   Has 6 grown children.   Remains quite active.   Blood pressure 122/78, pulse 64, respirations 18  Physical Exam:  Well appearing 83 yo woman, NAD HEENT: Unremarkable Neck:  6 cm JVD, no thyromegally Lungs:  Clear with no wheezes, rales, or rhonchi. HEART:  Regular rate rhythm, no murmurs, no rubs, no clicks Abd:  soft, positive bowel sounds, no organomegally, no rebound, no guarding Ext:  2 plus pulses, no edema, no cyanosis, no clubbing Skin:  No rashes no nodules Neuro:  CN II through XII intact, motor grossly intact  DEVICE  Normal device function.  See PaceArt for details. His fluid index is low   Assess/Plan: 1. Chronic systolic heart failure - if anything, his fluid index is low. I suspect his symptoms are multifactorial. He is currently on 80 mg of lasix bid. He appears euvolemic.  2. ICD - his St. Jude BiV ICD is working normally. He is about 8 months from ERI. I would anticipate placing a BiV PPM.  3. Chronic atrial fib - his rate is well controlled. Will follow. 4. VT - he has had no recurrent VT on ICD. Will continue his current meds.  5. Preoperative evaluation - he would be low/moderate risk for major cardiac complications if he needed surgery for his adrenal mass.  [name]

## 2016-06-14 NOTE — Patient Instructions (Addendum)
Medication Instructions:  Your physician recommends that you continue on your current medications as directed. Please refer to the Current Medication list given to you today.    Labwork: None Ordered   Testing/Procedures: None Ordered   Follow-Up: Your physician wants you to follow-up in: 6 months with Dr. Lovena Le. You will receive a reminder letter in the mail two months in advance. If you don't receive a letter, please call our office to schedule the follow-up appointment.  Remote monitoring is used to monitor your ICD from home. This monitoring reduces the number of office visits required to check your device to one time per year. It allows [place] to keep an eye on the functioning of your device to ensure it is working properly. You are scheduled for a device check from home on 09/12/16. You may send your transmission at any time that day. If you have a wireless device, the transmission will be sent automatically. After your physician reviews your transmission, you will receive a postcard with your next transmission date.     Any Other Special Instructions Will Be Listed Below (If Applicable).     If you need a refill on your cardiac medications before your next appointment, please call your pharmacy.

## 2016-06-17 DIAGNOSIS — M6281 Muscle weakness (generalized): Secondary | ICD-10-CM | POA: Diagnosis not present

## 2016-06-17 DIAGNOSIS — M170 Bilateral primary osteoarthritis of knee: Secondary | ICD-10-CM | POA: Diagnosis not present

## 2016-06-17 DIAGNOSIS — I130 Hypertensive heart and chronic kidney disease with heart failure and stage 1 through stage 4 chronic kidney disease, or unspecified chronic kidney disease: Secondary | ICD-10-CM | POA: Diagnosis not present

## 2016-06-17 DIAGNOSIS — I5022 Chronic systolic (congestive) heart failure: Secondary | ICD-10-CM | POA: Diagnosis not present

## 2016-06-17 DIAGNOSIS — L89152 Pressure ulcer of sacral region, stage 2: Secondary | ICD-10-CM | POA: Diagnosis not present

## 2016-06-17 DIAGNOSIS — E1122 Type 2 diabetes mellitus with diabetic chronic kidney disease: Secondary | ICD-10-CM | POA: Diagnosis not present

## 2016-06-17 LAB — CUP PACEART INCLINIC DEVICE CHECK
Brady Statistic RA Percent Paced: 0.7 %
Date Time Interrogation Session: 20180209162531
HIGH POWER IMPEDANCE MEASURED VALUE: 44.1384
Implantable Lead Implant Date: 20060802
Implantable Lead Implant Date: 20060802
Implantable Lead Location: 753859
Implantable Lead Location: 753860
Lead Channel Impedance Value: 412.5 Ohm
Lead Channel Impedance Value: 462.5 Ohm
Lead Channel Impedance Value: 550 Ohm
Lead Channel Pacing Threshold Amplitude: 0.75 V
Lead Channel Pacing Threshold Amplitude: 0.75 V
Lead Channel Pacing Threshold Amplitude: 0.75 V
Lead Channel Pacing Threshold Pulse Width: 0.8 ms
Lead Channel Pacing Threshold Pulse Width: 0.8 ms
Lead Channel Sensing Intrinsic Amplitude: 1.7 mV
Lead Channel Sensing Intrinsic Amplitude: 12 mV
Lead Channel Setting Pacing Amplitude: 2 V
Lead Channel Setting Sensing Sensitivity: 0.5 mV
MDC IDC LEAD IMPLANT DT: 20060802
MDC IDC LEAD LOCATION: 753858
MDC IDC MSMT LEADCHNL RV PACING THRESHOLD AMPLITUDE: 0.75 V
MDC IDC MSMT LEADCHNL RV PACING THRESHOLD PULSEWIDTH: 0.5 ms
MDC IDC MSMT LEADCHNL RV PACING THRESHOLD PULSEWIDTH: 0.5 ms
MDC IDC PG IMPLANT DT: 20110926
MDC IDC PG SERIAL: 623851
MDC IDC SET LEADCHNL LV PACING AMPLITUDE: 2.5 V
MDC IDC SET LEADCHNL LV PACING PULSEWIDTH: 0.8 ms
MDC IDC SET LEADCHNL RV PACING AMPLITUDE: 2.5 V
MDC IDC SET LEADCHNL RV PACING PULSEWIDTH: 0.5 ms
MDC IDC STAT BRADY RV PERCENT PACED: 93 %

## 2016-06-18 DIAGNOSIS — L89152 Pressure ulcer of sacral region, stage 2: Secondary | ICD-10-CM | POA: Diagnosis not present

## 2016-06-18 DIAGNOSIS — M6281 Muscle weakness (generalized): Secondary | ICD-10-CM | POA: Diagnosis not present

## 2016-06-18 DIAGNOSIS — E1122 Type 2 diabetes mellitus with diabetic chronic kidney disease: Secondary | ICD-10-CM | POA: Diagnosis not present

## 2016-06-18 DIAGNOSIS — I5022 Chronic systolic (congestive) heart failure: Secondary | ICD-10-CM | POA: Diagnosis not present

## 2016-06-18 DIAGNOSIS — I130 Hypertensive heart and chronic kidney disease with heart failure and stage 1 through stage 4 chronic kidney disease, or unspecified chronic kidney disease: Secondary | ICD-10-CM | POA: Diagnosis not present

## 2016-06-18 DIAGNOSIS — M170 Bilateral primary osteoarthritis of knee: Secondary | ICD-10-CM | POA: Diagnosis not present

## 2016-06-19 DIAGNOSIS — L89152 Pressure ulcer of sacral region, stage 2: Secondary | ICD-10-CM | POA: Diagnosis not present

## 2016-06-19 DIAGNOSIS — M170 Bilateral primary osteoarthritis of knee: Secondary | ICD-10-CM | POA: Diagnosis not present

## 2016-06-19 DIAGNOSIS — I5022 Chronic systolic (congestive) heart failure: Secondary | ICD-10-CM | POA: Diagnosis not present

## 2016-06-19 DIAGNOSIS — I130 Hypertensive heart and chronic kidney disease with heart failure and stage 1 through stage 4 chronic kidney disease, or unspecified chronic kidney disease: Secondary | ICD-10-CM | POA: Diagnosis not present

## 2016-06-19 DIAGNOSIS — M6281 Muscle weakness (generalized): Secondary | ICD-10-CM | POA: Diagnosis not present

## 2016-06-19 DIAGNOSIS — E1122 Type 2 diabetes mellitus with diabetic chronic kidney disease: Secondary | ICD-10-CM | POA: Diagnosis not present

## 2016-06-20 DIAGNOSIS — I130 Hypertensive heart and chronic kidney disease with heart failure and stage 1 through stage 4 chronic kidney disease, or unspecified chronic kidney disease: Secondary | ICD-10-CM | POA: Diagnosis not present

## 2016-06-20 DIAGNOSIS — M170 Bilateral primary osteoarthritis of knee: Secondary | ICD-10-CM | POA: Diagnosis not present

## 2016-06-20 DIAGNOSIS — M6281 Muscle weakness (generalized): Secondary | ICD-10-CM | POA: Diagnosis not present

## 2016-06-20 DIAGNOSIS — L89152 Pressure ulcer of sacral region, stage 2: Secondary | ICD-10-CM | POA: Diagnosis not present

## 2016-06-20 DIAGNOSIS — E1122 Type 2 diabetes mellitus with diabetic chronic kidney disease: Secondary | ICD-10-CM | POA: Diagnosis not present

## 2016-06-20 DIAGNOSIS — I5022 Chronic systolic (congestive) heart failure: Secondary | ICD-10-CM | POA: Diagnosis not present

## 2016-06-24 ENCOUNTER — Other Ambulatory Visit: Payer: Self-pay | Admitting: Internal Medicine

## 2016-06-24 DIAGNOSIS — R350 Frequency of micturition: Secondary | ICD-10-CM | POA: Diagnosis not present

## 2016-06-24 DIAGNOSIS — D497 Neoplasm of unspecified behavior of endocrine glands and other parts of nervous system: Secondary | ICD-10-CM | POA: Diagnosis not present

## 2016-06-25 ENCOUNTER — Other Ambulatory Visit: Payer: Self-pay | Admitting: *Deleted

## 2016-06-25 DIAGNOSIS — L89152 Pressure ulcer of sacral region, stage 2: Secondary | ICD-10-CM | POA: Diagnosis not present

## 2016-06-25 DIAGNOSIS — M6281 Muscle weakness (generalized): Secondary | ICD-10-CM | POA: Diagnosis not present

## 2016-06-25 DIAGNOSIS — I482 Chronic atrial fibrillation, unspecified: Secondary | ICD-10-CM

## 2016-06-25 DIAGNOSIS — M170 Bilateral primary osteoarthritis of knee: Secondary | ICD-10-CM | POA: Diagnosis not present

## 2016-06-25 DIAGNOSIS — I5022 Chronic systolic (congestive) heart failure: Secondary | ICD-10-CM | POA: Diagnosis not present

## 2016-06-25 DIAGNOSIS — E1122 Type 2 diabetes mellitus with diabetic chronic kidney disease: Secondary | ICD-10-CM | POA: Diagnosis not present

## 2016-06-25 DIAGNOSIS — I130 Hypertensive heart and chronic kidney disease with heart failure and stage 1 through stage 4 chronic kidney disease, or unspecified chronic kidney disease: Secondary | ICD-10-CM | POA: Diagnosis not present

## 2016-06-26 ENCOUNTER — Other Ambulatory Visit: Payer: Medicare Other | Admitting: *Deleted

## 2016-06-26 DIAGNOSIS — I482 Chronic atrial fibrillation, unspecified: Secondary | ICD-10-CM

## 2016-06-26 DIAGNOSIS — M6281 Muscle weakness (generalized): Secondary | ICD-10-CM | POA: Diagnosis not present

## 2016-06-26 DIAGNOSIS — E1122 Type 2 diabetes mellitus with diabetic chronic kidney disease: Secondary | ICD-10-CM | POA: Diagnosis not present

## 2016-06-26 DIAGNOSIS — M170 Bilateral primary osteoarthritis of knee: Secondary | ICD-10-CM | POA: Diagnosis not present

## 2016-06-26 DIAGNOSIS — L89152 Pressure ulcer of sacral region, stage 2: Secondary | ICD-10-CM | POA: Diagnosis not present

## 2016-06-26 DIAGNOSIS — I130 Hypertensive heart and chronic kidney disease with heart failure and stage 1 through stage 4 chronic kidney disease, or unspecified chronic kidney disease: Secondary | ICD-10-CM | POA: Diagnosis not present

## 2016-06-26 DIAGNOSIS — I5022 Chronic systolic (congestive) heart failure: Secondary | ICD-10-CM | POA: Diagnosis not present

## 2016-06-26 NOTE — Addendum Note (Signed)
Addended by: Eulis Foster on: 06/26/2016 12:44 PM   Modules accepted: Orders

## 2016-06-27 ENCOUNTER — Other Ambulatory Visit: Payer: Medicare Other

## 2016-06-27 DIAGNOSIS — E1122 Type 2 diabetes mellitus with diabetic chronic kidney disease: Secondary | ICD-10-CM | POA: Diagnosis not present

## 2016-06-27 DIAGNOSIS — M170 Bilateral primary osteoarthritis of knee: Secondary | ICD-10-CM | POA: Diagnosis not present

## 2016-06-27 DIAGNOSIS — I130 Hypertensive heart and chronic kidney disease with heart failure and stage 1 through stage 4 chronic kidney disease, or unspecified chronic kidney disease: Secondary | ICD-10-CM | POA: Diagnosis not present

## 2016-06-27 DIAGNOSIS — M6281 Muscle weakness (generalized): Secondary | ICD-10-CM | POA: Diagnosis not present

## 2016-06-27 DIAGNOSIS — I5022 Chronic systolic (congestive) heart failure: Secondary | ICD-10-CM | POA: Diagnosis not present

## 2016-06-27 DIAGNOSIS — L89152 Pressure ulcer of sacral region, stage 2: Secondary | ICD-10-CM | POA: Diagnosis not present

## 2016-06-27 LAB — BASIC METABOLIC PANEL
BUN / CREAT RATIO: 27 — AB (ref 10–24)
BUN: 43 mg/dL — AB (ref 8–27)
CALCIUM: 9.2 mg/dL (ref 8.6–10.2)
CO2: 27 mmol/L (ref 18–29)
Chloride: 98 mmol/L (ref 96–106)
Creatinine, Ser: 1.59 mg/dL — ABNORMAL HIGH (ref 0.76–1.27)
GFR calc non Af Amer: 38 — ABNORMAL LOW (ref >59)
GFR, EST AFRICAN AMERICAN: 44 — AB (ref >59)
Glucose: 135 mg/dL — ABNORMAL HIGH (ref 65–99)
Potassium: 4.9 mmol/L (ref 3.5–5.2)
Sodium: 140 mmol/L (ref 134–144)

## 2016-06-28 ENCOUNTER — Telehealth: Payer: Self-pay | Admitting: Pharmacist

## 2016-06-28 MED ORDER — APIXABAN 2.5 MG PO TABS: 2.5000 mg | ORAL_TABLET | Freq: Two times a day (BID) | ORAL | 5 refills | Status: DC

## 2016-06-28 NOTE — Telephone Encounter (Signed)
SCr consistently > 1.5 including on recheck 2 days ago and age > 63. Pt qualifies for Eliquis 2.5mg  BID dose rather than 5mg  BID dose. Lower dose refill sent in and pt made aware of reason for change in dose.

## 2016-06-28 NOTE — Telephone Encounter (Signed)
Received refill request for Eliquis 5mg  BID. SCr consistently > 1.5 including on recheck 2 days ago and age > 70. Pt qualifies for Eliquis 2.5mg  BID dose. Pt did not answer phone and unable to leave VM. Refill sent in for lower dose and will continue to attempt to reach pt to inform him of dose change.

## 2016-07-01 DIAGNOSIS — L89152 Pressure ulcer of sacral region, stage 2: Secondary | ICD-10-CM | POA: Diagnosis not present

## 2016-07-01 DIAGNOSIS — I130 Hypertensive heart and chronic kidney disease with heart failure and stage 1 through stage 4 chronic kidney disease, or unspecified chronic kidney disease: Secondary | ICD-10-CM | POA: Diagnosis not present

## 2016-07-01 DIAGNOSIS — I5022 Chronic systolic (congestive) heart failure: Secondary | ICD-10-CM | POA: Diagnosis not present

## 2016-07-01 DIAGNOSIS — E1122 Type 2 diabetes mellitus with diabetic chronic kidney disease: Secondary | ICD-10-CM | POA: Diagnosis not present

## 2016-07-01 DIAGNOSIS — M6281 Muscle weakness (generalized): Secondary | ICD-10-CM | POA: Diagnosis not present

## 2016-07-01 DIAGNOSIS — M170 Bilateral primary osteoarthritis of knee: Secondary | ICD-10-CM | POA: Diagnosis not present

## 2016-07-01 NOTE — Telephone Encounter (Signed)
Spoke with pt and discussed reason for dose decrease of Eliquis. He verbalized understanding and will pick up his refill. He was appreciative for the call.

## 2016-07-02 DIAGNOSIS — M170 Bilateral primary osteoarthritis of knee: Secondary | ICD-10-CM | POA: Diagnosis not present

## 2016-07-02 DIAGNOSIS — L89152 Pressure ulcer of sacral region, stage 2: Secondary | ICD-10-CM | POA: Diagnosis not present

## 2016-07-02 DIAGNOSIS — E1122 Type 2 diabetes mellitus with diabetic chronic kidney disease: Secondary | ICD-10-CM | POA: Diagnosis not present

## 2016-07-02 DIAGNOSIS — M6281 Muscle weakness (generalized): Secondary | ICD-10-CM | POA: Diagnosis not present

## 2016-07-02 DIAGNOSIS — I130 Hypertensive heart and chronic kidney disease with heart failure and stage 1 through stage 4 chronic kidney disease, or unspecified chronic kidney disease: Secondary | ICD-10-CM | POA: Diagnosis not present

## 2016-07-02 DIAGNOSIS — I5022 Chronic systolic (congestive) heart failure: Secondary | ICD-10-CM | POA: Diagnosis not present

## 2016-07-03 DIAGNOSIS — M6281 Muscle weakness (generalized): Secondary | ICD-10-CM | POA: Diagnosis not present

## 2016-07-03 DIAGNOSIS — E1122 Type 2 diabetes mellitus with diabetic chronic kidney disease: Secondary | ICD-10-CM | POA: Diagnosis not present

## 2016-07-03 DIAGNOSIS — I5022 Chronic systolic (congestive) heart failure: Secondary | ICD-10-CM | POA: Diagnosis not present

## 2016-07-03 DIAGNOSIS — L89152 Pressure ulcer of sacral region, stage 2: Secondary | ICD-10-CM | POA: Diagnosis not present

## 2016-07-03 DIAGNOSIS — I130 Hypertensive heart and chronic kidney disease with heart failure and stage 1 through stage 4 chronic kidney disease, or unspecified chronic kidney disease: Secondary | ICD-10-CM | POA: Diagnosis not present

## 2016-07-03 DIAGNOSIS — M170 Bilateral primary osteoarthritis of knee: Secondary | ICD-10-CM | POA: Diagnosis not present

## 2016-07-04 DIAGNOSIS — Z9981 Dependence on supplemental oxygen: Secondary | ICD-10-CM | POA: Diagnosis not present

## 2016-07-04 DIAGNOSIS — J181 Lobar pneumonia, unspecified organism: Secondary | ICD-10-CM | POA: Diagnosis not present

## 2016-07-04 DIAGNOSIS — E1129 Type 2 diabetes mellitus with other diabetic kidney complication: Secondary | ICD-10-CM | POA: Diagnosis not present

## 2016-07-04 DIAGNOSIS — I420 Dilated cardiomyopathy: Secondary | ICD-10-CM | POA: Diagnosis not present

## 2016-07-04 DIAGNOSIS — I509 Heart failure, unspecified: Secondary | ICD-10-CM | POA: Diagnosis not present

## 2016-07-04 DIAGNOSIS — I130 Hypertensive heart and chronic kidney disease with heart failure and stage 1 through stage 4 chronic kidney disease, or unspecified chronic kidney disease: Secondary | ICD-10-CM | POA: Diagnosis not present

## 2016-07-04 DIAGNOSIS — I10 Essential (primary) hypertension: Secondary | ICD-10-CM | POA: Diagnosis not present

## 2016-07-04 DIAGNOSIS — R0602 Shortness of breath: Secondary | ICD-10-CM | POA: Diagnosis not present

## 2016-07-04 DIAGNOSIS — J439 Emphysema, unspecified: Secondary | ICD-10-CM | POA: Diagnosis not present

## 2016-07-04 DIAGNOSIS — N183 Chronic kidney disease, stage 3 (moderate): Secondary | ICD-10-CM | POA: Diagnosis not present

## 2016-07-10 DIAGNOSIS — M6281 Muscle weakness (generalized): Secondary | ICD-10-CM | POA: Diagnosis not present

## 2016-07-10 DIAGNOSIS — L89152 Pressure ulcer of sacral region, stage 2: Secondary | ICD-10-CM | POA: Diagnosis not present

## 2016-07-10 DIAGNOSIS — I130 Hypertensive heart and chronic kidney disease with heart failure and stage 1 through stage 4 chronic kidney disease, or unspecified chronic kidney disease: Secondary | ICD-10-CM | POA: Diagnosis not present

## 2016-07-10 DIAGNOSIS — E1122 Type 2 diabetes mellitus with diabetic chronic kidney disease: Secondary | ICD-10-CM | POA: Diagnosis not present

## 2016-07-10 DIAGNOSIS — M170 Bilateral primary osteoarthritis of knee: Secondary | ICD-10-CM | POA: Diagnosis not present

## 2016-07-10 DIAGNOSIS — I5022 Chronic systolic (congestive) heart failure: Secondary | ICD-10-CM | POA: Diagnosis not present

## 2016-07-15 DIAGNOSIS — M6281 Muscle weakness (generalized): Secondary | ICD-10-CM | POA: Diagnosis not present

## 2016-07-15 DIAGNOSIS — M170 Bilateral primary osteoarthritis of knee: Secondary | ICD-10-CM | POA: Diagnosis not present

## 2016-07-15 DIAGNOSIS — I130 Hypertensive heart and chronic kidney disease with heart failure and stage 1 through stage 4 chronic kidney disease, or unspecified chronic kidney disease: Secondary | ICD-10-CM | POA: Diagnosis not present

## 2016-07-15 DIAGNOSIS — I5022 Chronic systolic (congestive) heart failure: Secondary | ICD-10-CM | POA: Diagnosis not present

## 2016-07-15 DIAGNOSIS — E1122 Type 2 diabetes mellitus with diabetic chronic kidney disease: Secondary | ICD-10-CM | POA: Diagnosis not present

## 2016-07-15 DIAGNOSIS — L89152 Pressure ulcer of sacral region, stage 2: Secondary | ICD-10-CM | POA: Diagnosis not present

## 2016-07-30 DIAGNOSIS — L570 Actinic keratosis: Secondary | ICD-10-CM | POA: Diagnosis not present

## 2016-07-30 DIAGNOSIS — L820 Inflamed seborrheic keratosis: Secondary | ICD-10-CM | POA: Diagnosis not present

## 2016-07-30 DIAGNOSIS — Z85828 Personal history of other malignant neoplasm of skin: Secondary | ICD-10-CM | POA: Diagnosis not present

## 2016-07-30 DIAGNOSIS — L905 Scar conditions and fibrosis of skin: Secondary | ICD-10-CM | POA: Diagnosis not present

## 2016-09-10 DIAGNOSIS — N183 Chronic kidney disease, stage 3 (moderate): Secondary | ICD-10-CM | POA: Diagnosis not present

## 2016-09-10 DIAGNOSIS — Z125 Encounter for screening for malignant neoplasm of prostate: Secondary | ICD-10-CM | POA: Diagnosis not present

## 2016-09-10 DIAGNOSIS — E1129 Type 2 diabetes mellitus with other diabetic kidney complication: Secondary | ICD-10-CM | POA: Diagnosis not present

## 2016-09-10 DIAGNOSIS — E784 Other hyperlipidemia: Secondary | ICD-10-CM | POA: Diagnosis not present

## 2016-09-12 ENCOUNTER — Telehealth: Payer: Self-pay | Admitting: Cardiology

## 2016-09-12 ENCOUNTER — Encounter: Payer: Medicare Other | Admitting: *Deleted

## 2016-09-12 NOTE — Telephone Encounter (Signed)
Spoke with pt and reminded pt of remote transmission that is due today. Pt verbalized understanding.   

## 2016-09-13 ENCOUNTER — Encounter: Payer: Self-pay | Admitting: Cardiology

## 2016-09-19 DIAGNOSIS — N401 Enlarged prostate with lower urinary tract symptoms: Secondary | ICD-10-CM | POA: Diagnosis not present

## 2016-09-19 DIAGNOSIS — N184 Chronic kidney disease, stage 4 (severe): Secondary | ICD-10-CM | POA: Diagnosis not present

## 2016-09-19 DIAGNOSIS — Z9981 Dependence on supplemental oxygen: Secondary | ICD-10-CM | POA: Diagnosis not present

## 2016-09-19 DIAGNOSIS — E1129 Type 2 diabetes mellitus with other diabetic kidney complication: Secondary | ICD-10-CM | POA: Diagnosis not present

## 2016-09-19 DIAGNOSIS — Z6826 Body mass index (BMI) 26.0-26.9, adult: Secondary | ICD-10-CM | POA: Diagnosis not present

## 2016-09-19 DIAGNOSIS — Z1389 Encounter for screening for other disorder: Secondary | ICD-10-CM | POA: Diagnosis not present

## 2016-09-19 DIAGNOSIS — Z95810 Presence of automatic (implantable) cardiac defibrillator: Secondary | ICD-10-CM | POA: Diagnosis not present

## 2016-09-19 DIAGNOSIS — I420 Dilated cardiomyopathy: Secondary | ICD-10-CM | POA: Diagnosis not present

## 2016-09-19 DIAGNOSIS — I509 Heart failure, unspecified: Secondary | ICD-10-CM | POA: Diagnosis not present

## 2016-09-19 DIAGNOSIS — I130 Hypertensive heart and chronic kidney disease with heart failure and stage 1 through stage 4 chronic kidney disease, or unspecified chronic kidney disease: Secondary | ICD-10-CM | POA: Diagnosis not present

## 2016-09-19 DIAGNOSIS — Z0000 Encounter for general adult medical examination without abnormal findings: Secondary | ICD-10-CM | POA: Diagnosis not present

## 2016-09-19 DIAGNOSIS — J439 Emphysema, unspecified: Secondary | ICD-10-CM | POA: Diagnosis not present

## 2016-09-23 ENCOUNTER — Other Ambulatory Visit: Payer: Self-pay | Admitting: Internal Medicine

## 2016-10-30 ENCOUNTER — Other Ambulatory Visit: Payer: Self-pay | Admitting: Internal Medicine

## 2016-11-01 DIAGNOSIS — H01001 Unspecified blepharitis right upper eyelid: Secondary | ICD-10-CM | POA: Diagnosis not present

## 2016-11-01 DIAGNOSIS — H43813 Vitreous degeneration, bilateral: Secondary | ICD-10-CM | POA: Diagnosis not present

## 2016-11-01 DIAGNOSIS — H01004 Unspecified blepharitis left upper eyelid: Secondary | ICD-10-CM | POA: Diagnosis not present

## 2016-11-01 DIAGNOSIS — H52203 Unspecified astigmatism, bilateral: Secondary | ICD-10-CM | POA: Diagnosis not present

## 2016-12-18 ENCOUNTER — Encounter: Payer: Self-pay | Admitting: Internal Medicine

## 2016-12-22 ENCOUNTER — Other Ambulatory Visit: Payer: Self-pay | Admitting: Internal Medicine

## 2016-12-27 DIAGNOSIS — R5383 Other fatigue: Secondary | ICD-10-CM | POA: Diagnosis not present

## 2016-12-27 DIAGNOSIS — I10 Essential (primary) hypertension: Secondary | ICD-10-CM | POA: Diagnosis not present

## 2016-12-27 DIAGNOSIS — E875 Hyperkalemia: Secondary | ICD-10-CM | POA: Diagnosis not present

## 2016-12-27 DIAGNOSIS — N184 Chronic kidney disease, stage 4 (severe): Secondary | ICD-10-CM | POA: Diagnosis not present

## 2016-12-27 DIAGNOSIS — R05 Cough: Secondary | ICD-10-CM | POA: Diagnosis not present

## 2016-12-27 DIAGNOSIS — R634 Abnormal weight loss: Secondary | ICD-10-CM | POA: Diagnosis not present

## 2016-12-27 DIAGNOSIS — Z6826 Body mass index (BMI) 26.0-26.9, adult: Secondary | ICD-10-CM | POA: Diagnosis not present

## 2016-12-27 DIAGNOSIS — I959 Hypotension, unspecified: Secondary | ICD-10-CM | POA: Diagnosis not present

## 2016-12-30 ENCOUNTER — Encounter (HOSPITAL_COMMUNITY): Payer: Self-pay | Admitting: Emergency Medicine

## 2016-12-30 ENCOUNTER — Emergency Department (HOSPITAL_COMMUNITY): Payer: Medicare Other

## 2016-12-30 ENCOUNTER — Inpatient Hospital Stay (HOSPITAL_COMMUNITY)
Admission: EM | Admit: 2016-12-30 | Discharge: 2017-01-02 | DRG: 683 | Disposition: A | Discharge status: Home / Self Care | Payer: Medicare Other | Attending: Internal Medicine | Admitting: Internal Medicine

## 2016-12-30 DIAGNOSIS — E860 Dehydration: Secondary | ICD-10-CM | POA: Diagnosis present

## 2016-12-30 DIAGNOSIS — N179 Acute kidney failure, unspecified: Secondary | ICD-10-CM | POA: Diagnosis not present

## 2016-12-30 DIAGNOSIS — R634 Abnormal weight loss: Secondary | ICD-10-CM | POA: Diagnosis present

## 2016-12-30 DIAGNOSIS — I428 Other cardiomyopathies: Secondary | ICD-10-CM | POA: Diagnosis not present

## 2016-12-30 DIAGNOSIS — I959 Hypotension, unspecified: Secondary | ICD-10-CM

## 2016-12-30 DIAGNOSIS — R0902 Hypoxemia: Secondary | ICD-10-CM

## 2016-12-30 DIAGNOSIS — E875 Hyperkalemia: Secondary | ICD-10-CM | POA: Diagnosis not present

## 2016-12-30 DIAGNOSIS — Z95810 Presence of automatic (implantable) cardiac defibrillator: Secondary | ICD-10-CM | POA: Diagnosis not present

## 2016-12-30 DIAGNOSIS — I5022 Chronic systolic (congestive) heart failure: Secondary | ICD-10-CM | POA: Diagnosis present

## 2016-12-30 DIAGNOSIS — E785 Hyperlipidemia, unspecified: Secondary | ICD-10-CM | POA: Diagnosis not present

## 2016-12-30 DIAGNOSIS — R531 Weakness: Secondary | ICD-10-CM | POA: Diagnosis not present

## 2016-12-30 DIAGNOSIS — I482 Chronic atrial fibrillation: Secondary | ICD-10-CM

## 2016-12-30 DIAGNOSIS — I429 Cardiomyopathy, unspecified: Secondary | ICD-10-CM | POA: Diagnosis present

## 2016-12-30 DIAGNOSIS — I130 Hypertensive heart and chronic kidney disease with heart failure and stage 1 through stage 4 chronic kidney disease, or unspecified chronic kidney disease: Secondary | ICD-10-CM | POA: Diagnosis not present

## 2016-12-30 DIAGNOSIS — Z8679 Personal history of other diseases of the circulatory system: Secondary | ICD-10-CM | POA: Diagnosis not present

## 2016-12-30 DIAGNOSIS — Z79899 Other long term (current) drug therapy: Secondary | ICD-10-CM

## 2016-12-30 DIAGNOSIS — I2510 Atherosclerotic heart disease of native coronary artery without angina pectoris: Secondary | ICD-10-CM | POA: Diagnosis present

## 2016-12-30 DIAGNOSIS — I9589 Other hypotension: Secondary | ICD-10-CM | POA: Diagnosis not present

## 2016-12-30 DIAGNOSIS — N183 Chronic kidney disease, stage 3 unspecified: Secondary | ICD-10-CM | POA: Diagnosis present

## 2016-12-30 DIAGNOSIS — I4891 Unspecified atrial fibrillation: Secondary | ICD-10-CM | POA: Diagnosis present

## 2016-12-30 DIAGNOSIS — J449 Chronic obstructive pulmonary disease, unspecified: Secondary | ICD-10-CM | POA: Diagnosis not present

## 2016-12-30 DIAGNOSIS — N189 Chronic kidney disease, unspecified: Secondary | ICD-10-CM

## 2016-12-30 DIAGNOSIS — Z8249 Family history of ischemic heart disease and other diseases of the circulatory system: Secondary | ICD-10-CM

## 2016-12-30 LAB — COMPREHENSIVE METABOLIC PANEL
ALK PHOS: 78 U/L (ref 38–126)
ALT: 11 U/L — ABNORMAL LOW (ref 17–63)
AST: 16 U/L (ref 15–41)
Albumin: 4 g/dL (ref 3.5–5.0)
Anion gap: 9 (ref 5–15)
BILIRUBIN TOTAL: 1 mg/dL (ref 0.3–1.2)
BUN: 94 mg/dL — AB (ref 6–20)
CALCIUM: 9.5 mg/dL (ref 8.9–10.3)
CO2: 23 mmol/L (ref 22–32)
CREATININE: 2.44 mg/dL — AB (ref 0.61–1.24)
Chloride: 104 mmol/L (ref 101–111)
GFR calc Af Amer: 26 mL/min — ABNORMAL LOW (ref >60)
GFR, EST NON AFRICAN AMERICAN: 22 mL/min — AB (ref >60)
GLUCOSE: 122 mg/dL — AB (ref 65–99)
Potassium: 5 mmol/L (ref 3.5–5.1)
Sodium: 136 mmol/L (ref 135–145)
TOTAL PROTEIN: 7.5 g/dL (ref 6.5–8.1)

## 2016-12-30 LAB — CBC WITH DIFFERENTIAL/PLATELET
BASOS ABS: 0 10*3/uL (ref 0.0–0.1)
Basophils Relative: 1 %
Eosinophils Absolute: 0.2 10*3/uL (ref 0.0–0.7)
Eosinophils Relative: 3 %
HEMATOCRIT: 38.6 % — AB (ref 39.0–52.0)
Hemoglobin: 12.6 g/dL — ABNORMAL LOW (ref 13.0–17.0)
LYMPHS PCT: 23 %
Lymphs Abs: 1.5 10*3/uL (ref 0.7–4.0)
MCH: 30.4 pg (ref 26.0–34.0)
MCHC: 32.6 g/dL (ref 30.0–36.0)
MCV: 93.2 fL (ref 78.0–100.0)
MONO ABS: 0.4 10*3/uL (ref 0.1–1.0)
MONOS PCT: 6 %
Neutro Abs: 4.5 10*3/uL (ref 1.7–7.7)
Neutrophils Relative %: 69 %
PLATELETS: 167 10*3/uL (ref 150–400)
RBC: 4.14 MIL/uL — ABNORMAL LOW (ref 4.22–5.81)
RDW: 13.1 % (ref 11.5–15.5)
WBC: 6.6 10*3/uL (ref 4.0–10.5)

## 2016-12-30 LAB — URINALYSIS, ROUTINE W REFLEX MICROSCOPIC
BILIRUBIN URINE: NEGATIVE
GLUCOSE, UA: NEGATIVE mg/dL
HGB URINE DIPSTICK: NEGATIVE
KETONES UR: NEGATIVE mg/dL
Leukocytes, UA: NEGATIVE
Nitrite: NEGATIVE
PROTEIN: NEGATIVE mg/dL
Specific Gravity, Urine: 1.008 (ref 1.005–1.030)
pH: 5 (ref 5.0–8.0)

## 2016-12-30 LAB — BRAIN NATRIURETIC PEPTIDE: B Natriuretic Peptide: 228.6 pg/mL — ABNORMAL HIGH (ref 0.0–100.0)

## 2016-12-30 LAB — I-STAT CG4 LACTIC ACID, ED: Lactic Acid, Venous: 1.15 mmol/L (ref 0.5–1.9)

## 2016-12-30 LAB — PROTIME-INR
INR: 1.43
Prothrombin Time: 17.6 seconds — ABNORMAL HIGH (ref 11.4–15.2)

## 2016-12-30 LAB — I-STAT TROPONIN, ED: TROPONIN I, POC: 0.02 ng/mL (ref 0.00–0.08)

## 2016-12-30 MED ORDER — ONDANSETRON HCL 4 MG PO TABS: 4.0000 mg | ORAL_TABLET | Freq: Four times a day (QID) | ORAL | Status: DC | PRN

## 2016-12-30 MED ORDER — APIXABAN 2.5 MG PO TABS
2.5000 mg | ORAL_TABLET | Freq: Two times a day (BID) | ORAL | Status: DC
  Administered 2016-12-31 – 2017-01-02 (×5): 2.5 mg via ORAL
  Filled 2016-12-30 (×5): qty 1

## 2016-12-30 MED ORDER — SODIUM CHLORIDE 0.9% FLUSH
3.0000 mL | Freq: Two times a day (BID) | INTRAVENOUS | Status: DC
  Administered 2016-12-31 – 2017-01-02 (×3): 3 mL via INTRAVENOUS

## 2016-12-30 MED ORDER — ACETAMINOPHEN 325 MG PO TABS: 650.0000 mg | ORAL_TABLET | Freq: Four times a day (QID) | ORAL | Status: DC | PRN

## 2016-12-30 MED ORDER — SODIUM CHLORIDE 0.9 % IV SOLN
INTRAVENOUS | Status: DC
  Administered 2016-12-30: 22:00:00 via INTRAVENOUS
  Administered 2016-12-31: 75 mL/h via INTRAVENOUS
  Administered 2017-01-02: 07:00:00 via INTRAVENOUS

## 2016-12-30 MED ORDER — SODIUM CHLORIDE 0.9 % IV BOLUS (SEPSIS)
500.0000 mL | Freq: Once | INTRAVENOUS | Status: AC
  Administered 2016-12-30: 500 mL via INTRAVENOUS

## 2016-12-30 MED ORDER — FLUTICASONE FUROATE 27.5 MCG/SPRAY NA SUSP
2.0000 | Freq: Every day | NASAL | Status: DC
Start: 2016-12-30 — End: 2016-12-31

## 2016-12-30 MED ORDER — TAMSULOSIN HCL 0.4 MG PO CAPS
0.4000 mg | ORAL_CAPSULE | Freq: Every day | ORAL | Status: DC
  Administered 2016-12-31 – 2017-01-01 (×2): 0.4 mg via ORAL
  Filled 2016-12-30 (×2): qty 1

## 2016-12-30 MED ORDER — ONDANSETRON HCL 4 MG/2ML IJ SOLN: 4.0000 mg | Freq: Four times a day (QID) | INTRAMUSCULAR | Status: DC | PRN

## 2016-12-30 MED ORDER — POLYETHYLENE GLYCOL 3350 17 G PO PACK: 17.0000 g | PACK | Freq: Every day | ORAL | Status: DC | PRN

## 2016-12-30 MED ORDER — POLYETHYL GLYCOL-PROPYL GLYCOL 0.4-0.3 % OP GEL
Freq: Every day | OPHTHALMIC | Status: DC
  Filled 2016-12-30: qty 10

## 2016-12-30 MED ORDER — SODIUM CHLORIDE 0.9 % IV SOLN
Freq: Once | INTRAVENOUS | Status: AC
  Administered 2016-12-30: 75 mL/h via INTRAVENOUS

## 2016-12-30 MED ORDER — ACETAMINOPHEN 650 MG RE SUPP: 650.0000 mg | Freq: Four times a day (QID) | RECTAL | Status: DC | PRN

## 2016-12-30 NOTE — H&P (Addendum)
History and Physical:    TRUTH BAROT   [id number]:[id number] DOB: July 29, 1926 DOA: 12/30/2016  Referring MD/provider: Charlesetta Shanks, MD PCP: Haywood Pao, MD   Patient coming from: Home  Chief Complaint: Weakness/fatigue  History of Present Illness:   Arthur Armstrong is an 83 y.o. male with a past medical history of class II chronic systolic CHF, EF 67-67 percent by 2-D echo 05/20/16, status post biventricular ICD implantation, nonischemic cardiomyopathy, atrial fibrillation on Eliquis, hyperlipidemia, hypertension, history of mitral valve repair, adrenal mass who presented to his PCPs office on 12/27/16 for evaluation of a one-month history of weakness, loss of energy, loss of appetite and weight loss. He was noted to have low blood pressure during that visit, and was instructed to hold one of his blood pressure medications (Norvasc/Olmesartan), but he was not instructed to discontinue Lasix. He was reevaluated by his PCP earlier today and was noted to have ongoing hypotension, and was instructed to come to the ED for further evaluation.  He also follows with Dr. Lovena Le of cardiology, and at his last office visit, he was noted to have rate controlled A. Fib.  He was admitted to the hospital 05/18/16-05/21/16 for treatment of dyspnea and cough in the setting of a URI, but is noted to be hypotensive during that hospital stay. He responded well to IV fluids, and it was noted that his diuretics should be given cautiously, however he was discharged on 80 mg of Lasix in the morning and 40 in the afternoon, which is what he was on when he was admitted to the hospital.  ED Course:  The patient was mentating well but noted to be persistently hypotensive despite gentle IV fluid bolus. Chest x-ray was unremarkable for acute findings. Lactic acid was not elevated. BUN/creatinine were both elevated over baseline values with the current BUN of 94, and a creatinine of 2.44.  ROS:   Review of Systems    Constitutional: Positive for malaise/fatigue. Negative for chills and fever.       Appetite loss with weight loss  HENT: Negative.   Eyes: Negative.   Respiratory: Negative.   Cardiovascular: Negative for chest pain, palpitations and leg swelling.  Gastrointestinal: Negative.   Genitourinary: Negative.   Musculoskeletal: Negative.   Skin: Negative.   Neurological: Positive for weakness. Negative for dizziness.  Endo/Heme/Allergies: Bruises/bleeds easily.  Psychiatric/Behavioral: Negative.     Past Medical History:   Past Medical History:  Diagnosis Date  . Atrial fibrillation ([place])   . Automatic implantable cardiac defibrillator in situ    BiV pacer  . Chronic systolic CHF (congestive heart failure) ([place])   . Coronary artery disease, non-occlusive    cath 2006 pLM 20%, pLAD 40%; mCFX 20%  . Hernia, ventral   . HLD (hyperlipidemia)   . HTN (hypertension)   . ICD (implantable cardiac defibrillator) in place    BIV ICD  . NICM (nonischemic cardiomyopathy) ([place])    nicm ef 30-35%,nonob cad-cath 2006,echo 2/12:ef30-35%.mod lvh,inf hk,mild-mod lae and mild rae,pasp 35  . Pacemaker    BIV ICD  . PFO (patent foramen ovale)    s/p repair 2006  . S/P mitral valve repair    2006  . Shortness of breath   . Systolic CHF ([place])   . Unspecified essential hypertension     Past Surgical History:   Past Surgical History:  Procedure Laterality Date  . BILATERAL KNEE ARTHROSCOPY    . COLONOSCOPY WITH POLYPECTOMY    . HIP PINNING  3/2/012  . IMPLANTATION OF A DUAL CHAMBER BIVENTRICULAR ICD    . INTRAOPERATIVE TRANSESOPHAGEAL ECHOCARDIOGRAM    . MEDIAN STEROTOMY FOR MITRAL VALVE REPAIR    . RT. SHOULDER      Social History:   Social History   Social History  . Marital status: Married    Spouse name: N/A  . Number of children: 6  . Years of education: N/A   Occupational History  . 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 Smoking status: Former Smoker    Packs/day: 1.00    Years: 5.00    Quit date: 05/06/1961  . Smokeless tobacco: Former Systems developer    Quit date: 05/07/1951  . Alcohol use No  . Drug use: No  . Sexual activity: Not Currently   Other Topics Concern  . Not on file   Social History Narrative   Lives with his wife in [place].   Has 6 grown children.   Remains quite active.    Allergies   Patient has no known allergies.  Family history:   Family History  Problem Relation Age of Onset  . Heart attack Mother   . Heart attack Father   . Heart disease Neg Hx        Rheumatic or valvular heart disease   . Cardiomyopathy Neg Hx     Current Medications:   Prior to Admission medications   Medication Sig Start Date End Date Taking? Authorizing Provider  acetaminophen (TYLENOL) 500 MG tablet Take 500 mg by mouth every 6 (six) hours as needed for mild pain.    Yes [provider]  carvedilol (COREG) 6.25 MG tablet TAKE 1 TABLET(6.25 MG) BY MOUTH TWICE DAILY WITH A MEAL 09/24/16  Yes Evans Lance, MD  ELIQUIS 2.5 MG TABS tablet TAKE 1 TABLET BY MOUTH TWICE DAILY 12/23/16  Yes Evans Lance, MD  fluticasone (VERAMYST) 27.5 MCG/SPRAY nasal spray Place 2 sprays into the nose daily.   Yes [provider]  furosemide (LASIX) 40 MG tablet Take 1 tablet (40 mg total) by mouth daily. Take two in the morning then take 1 tablet by mouth in the afternoon Patient taking differently: Take 40 mg by mouth See admin instructions. Take two in the morning then take 1 tablet by mouth in the afternoon 05/21/16  Yes Rizwan, Eunice Blase, MD  Polyethyl Glycol-Propyl Glycol (SYSTANE OP) Place 1 drop into both eyes daily.   Yes [provider]  potassium chloride SA (K-DUR,KLOR-CON) 20 MEQ tablet Take 2 tablets (40 mEq total) by mouth daily. Take 2 tablets (40 mEq) in the mornings and 1 tablet (20 mEq) in the evenings 05/21/16  Yes Rizwan, Eunice Blase, MD  Tamsulosin HCl (FLOMAX)  0.4 MG CAPS Take 0.4 mg by mouth at bedtime.     Yes [provider]  azithromycin (ZITHROMAX) 250 MG tablet Take 1 tablet (250 mg total) by mouth daily. Patient not taking: Reported on 12/30/2016 05/21/16   Debbe Odea, MD  carvedilol (COREG) 6.25 MG tablet TAKE 1 TABLET(6.25 MG) BY MOUTH TWICE DAILY WITH A MEAL Patient not taking: Reported on 12/30/2016 12/25/16   Nahser, Wonda Cheng, MD  diclofenac sodium (VOLTAREN) 1 % GEL Apply 2 g topically 4 (four) times daily. Patient not taking: Reported on 12/30/2016 05/21/16   Debbe Odea, MD  potassium chloride SA (K-DUR,KLOR-CON) 20 MEQ tablet TAKE 2 TABLETS BY MOUTH TWICE DAILY Patient  not taking: Reported on 12/30/2016 11/01/16   Evans Lance, MD  predniSONE (DELTASONE) 50 MG tablet 40 mg tomorrow- taper by 10 mg daily until finished Patient not taking: Reported on 12/30/2016 05/22/16   Debbe Odea, MD  valsartan (DIOVAN) 80 MG tablet Take 1 tablet (80 mg total) by mouth daily. Patient not taking: Reported on 12/30/2016 05/21/16   Debbe Odea, MD    Physical Exam:   Vitals:   12/30/16 1730 12/30/16 1745 12/30/16 1800 12/30/16 1815  BP: 90/68 91/68 (!) 89/64 94/64  Pulse: 60 60 68 62  Resp: 13 13 13 13   Temp:      TempSrc:      SpO2: 100% 100% 100% 100%     Physical Exam: Blood pressure 94/64, pulse 62, temperature 97.7 F (36.5 C), temperature source Oral, resp. rate 13, SpO2 100 %. Gen: Elderly, chronically ill-appearing male. No acute distress. Head: Normocephalic, atraumatic. Eyes: Pupils equal, round and reactive to light. Lens implants noted. Extraocular movements intact.  Sclerae nonicteric. Arcus senilis present. No lid lag. Mouth: Oropharynx reveals mildly dry mucous membranes. Edentulous with dentures to the upper and lower palate. Neck: Supple, no thyromegaly, no lymphadenopathy, no jugular venous distention. Chest: Lungs are clear to auscultation with good air movement. No rales, rhonchi or wheezes.  CV: Heart  sounds are regular with an S1, S2. No murmurs, rubs, clicks, or gallops.  Abdomen: Soft, nontender, nondistended with normal active bowel sounds. No hepatosplenomegaly or palpable masses. Extremities: Extremities are without clubbing, or cyanosis. No edema. Pedal pulses 2+.  Skin: Warm and dry. Sun damaged skin. No rashes, lesions or wounds. Neuro: Alert and oriented times 3; grossly nonfocal.  Psych: Insight is good and judgment is appropriate. Mood and affect normal.  Data Review:    Labs: Basic Metabolic Panel:  Recent Labs Lab 12/30/16 1256  NA 136  K 5.0  CL 104  CO2 23  GLUCOSE 122*  BUN 94*  CREATININE 2.44*  CALCIUM 9.5   Liver Function Tests:  Recent Labs Lab 12/30/16 1256  AST 16  ALT 11*  ALKPHOS 78  BILITOT 1.0  PROT 7.5  ALBUMIN 4.0   CBC:  Recent Labs Lab 12/30/16 1256  WBC 6.6  NEUTROABS 4.5  HGB 12.6*  HCT 38.6*  MCV 93.2  PLT 167    Urinalysis    Component Value Date/Time   COLORURINE STRAW (A) 12/30/2016 1730   APPEARANCEUR CLEAR 12/30/2016 1730   LABSPEC 1.008 12/30/2016 1730   PHURINE 5.0 12/30/2016 1730   GLUCOSEU NEGATIVE 12/30/2016 1730   HGBUR NEGATIVE 12/30/2016 1730   BILIRUBINUR NEGATIVE 12/30/2016 1730   KETONESUR NEGATIVE 12/30/2016 1730   PROTEINUR NEGATIVE 12/30/2016 1730   UROBILINOGEN 0.2 02/27/2014 1343   NITRITE NEGATIVE 12/30/2016 1730   LEUKOCYTESUR NEGATIVE 12/30/2016 1730     Radiographic Studies: 12/30/16 Dg Chest 2 View: My independent review of the image shows: Hyperinflated lungs, cardiomegaly, no evidence of pulmonary edema or pleural effusions.    EKG: Independently reviewed and per my read:  Ventricular paced rhythm at 63 bpm. No ischemic findings noted.   Assessment/Plan:   Principal Problem:   Hypotension associated with AKI in a patient with stage III chronic kidney disease Baseline creatinine is 1.5-1.7. Current creatinine 2.44. AKI likely secondary to hypotension in the setting of  multiple antihypertensives that can impact kidney function. Hold antihypertensives and gently hydrate. Monitor closely for signs of volume overload given history of systolic CHF.  Active Problems:   Weakness with weight  loss Likely from hypotension and medication side effects. Will get dietitian evaluation for nutritional assessment as well as PT evaluation.    Atrial fibrillation ([place]) Anticoagulated chronically, and is ventricular paced.    Automatic implantable cardioverter-defibrillator in situ According to Dr. Tanna Furry last clinic note, there have not been any episodes of ventricular tachycardia.    Dyslipidemia Not on statin therapy. Heart healthy diet.    Chronic systolic CHF (congestive heart failure) (HCC)/nonischemic cardiomyopathy No evidence of decompensation. Chest x-ray clear. Lungs clear. No reports of shortness of breath. Monitor closely while on IV fluids. Monitor daily weights and I/oh balance.   Other information:   DVT prophylaxis: Eliquis ordered. Code Status: Full code. Family Communication: Daughter at the bedside. Disposition Plan: Home 12/31/16 and blood pressure improved and kidney function better. Consults called: None. Admission status: Observation.   Guerry Covington [facility] Pager [phone] Cell: [phone]   If 7PM-7AM, please contact night-coverage [email or web address] Password [facility] 12/30/2016, 6:26 PM

## 2016-12-30 NOTE — ED Triage Notes (Addendum)
States  Is coming from dr office sent today for low bp, had labs drawn today , has had diarrhea and no real appetitie , is on home o2   Some sob no cp  Was told to d/c one of his bp meds on Friday after seeing dr then

## 2016-12-30 NOTE — ED Provider Notes (Signed)
[facility] DEPT Provider Note   CSN: [id number] Arrival date & time: 12/30/16  1226     History   Chief Complaint Chief Complaint  Patient presents with  . Hypotension    HPI Arthur Armstrong is a 83 y.o. male.  HPI Patient reports increasing weakness and fatigue over 2 week duration. Worse over the past couple of days. No associated chest pain. No fever. No productive cough. Patient reports that he has had decreased appetite. He has not developed nausea or vomiting. A few episodes of diarrhea just over past 1-2 days. Denies change in urination. No swelling of the legs. Patient has been compliant with his medications and continues to take Lasix as prescribed. His daughters indicate they do not think he is taking in enough fluids.patient reports that he did have a medication discontinued but he cannot recall the name. He reports he understood that it"could cause cancer". Patient was seen by PCP today and referred to the emergency department. Past Medical History:  Diagnosis Date  . Atrial fibrillation ([place])   . Automatic implantable cardiac defibrillator in situ    BiV pacer  . Chronic systolic CHF (congestive heart failure) ([place])   . Coronary artery disease, non-occlusive    cath 2006 pLM 20%, pLAD 40%; mCFX 20%  . Hernia, ventral   . HLD (hyperlipidemia)   . HTN (hypertension)   . ICD (implantable cardiac defibrillator) in place    BIV ICD  . NICM (nonischemic cardiomyopathy) ([place])    nicm ef 30-35%,nonob cad-cath 2006,echo 2/12:ef30-35%.mod lvh,inf hk,mild-mod lae and mild rae,pasp 35  . Pacemaker    BIV ICD  . PFO (patent foramen ovale)    s/p repair 2006  . S/P mitral valve repair    2006  . Shortness of breath   . Systolic CHF ([place])   . Unspecified essential hypertension     Patient Active Problem List   Diagnosis Date Noted  . Acute bronchitis 05/21/2016  . Arthritis of knee 05/21/2016  . Pedal edema 05/21/2016  . Chronic right-sided CHF (congestive heart  failure) ([place]) 05/21/2016  . Respiratory failure, acute ([place]) 05/19/2016  . Acute renal failure superimposed on stage 3 chronic kidney disease ([place]) 05/18/2016  . Foot pain 05/18/2016  . Diabetes mellitus without complication ([place]) 75/02/2584  . Hypotension 05/18/2016  . Nonischemic cardiomyopathy ([place]) 04/03/2012  . Chronic systolic CHF (congestive heart failure) ([place]) 04/02/2012  . Hypoxemia 04/01/2012  . Syncope 03/31/2012  . S/P mitral valve repair   . Dyslipidemia 02/04/2012  . Atrial fibrillation ([place]) 09/21/2009  . Automatic implantable cardioverter-defibrillator in situ 11/04/2008    Past Surgical History:  Procedure Laterality Date  . BILATERAL KNEE ARTHROSCOPY    . COLONOSCOPY WITH POLYPECTOMY    . HIP PINNING  3/2/012  . IMPLANTATION OF A DUAL CHAMBER BIVENTRICULAR ICD    . INTRAOPERATIVE TRANSESOPHAGEAL ECHOCARDIOGRAM    . MEDIAN STEROTOMY FOR MITRAL VALVE REPAIR    . RT. SHOULDER         Home Medications    Prior to Admission medications   Medication Sig Start Date End Date Taking? Authorizing Provider  acetaminophen (TYLENOL) 500 MG tablet Take 500 mg by mouth every 6 (six) hours as needed for mild pain.    Yes [provider]  carvedilol (COREG) 6.25 MG tablet TAKE 1 TABLET(6.25 MG) BY MOUTH TWICE DAILY WITH A MEAL 09/24/16  Yes Evans Lance, MD  ELIQUIS 2.5 MG TABS tablet TAKE 1 TABLET BY MOUTH TWICE DAILY 12/23/16  Yes Evans Lance, MD  fluticasone (VERAMYST) 27.5 MCG/SPRAY nasal spray Place 2 sprays into the nose daily.   Yes [provider]  furosemide (LASIX) 40 MG tablet Take 1 tablet (40 mg total) by mouth daily. Take two in the morning then take 1 tablet by mouth in the afternoon Patient taking differently: Take 40 mg by mouth See admin instructions. Take two in the morning then take 1 tablet by mouth in the afternoon 05/21/16  Yes Rizwan, Eunice Blase, MD  Polyethyl Glycol-Propyl Glycol (SYSTANE OP) Place 1 drop into both eyes daily.   Yes  [provider]  potassium chloride SA (K-DUR,KLOR-CON) 20 MEQ tablet Take 2 tablets (40 mEq total) by mouth daily. Take 2 tablets (40 mEq) in the mornings and 1 tablet (20 mEq) in the evenings 05/21/16  Yes Rizwan, Eunice Blase, MD  Tamsulosin HCl (FLOMAX) 0.4 MG CAPS Take 0.4 mg by mouth at bedtime.     Yes [provider]  azithromycin (ZITHROMAX) 250 MG tablet Take 1 tablet (250 mg total) by mouth daily. Patient not taking: Reported on 12/30/2016 05/21/16   Debbe Odea, MD  carvedilol (COREG) 6.25 MG tablet TAKE 1 TABLET(6.25 MG) BY MOUTH TWICE DAILY WITH A MEAL Patient not taking: Reported on 12/30/2016 12/25/16   Nahser, Wonda Cheng, MD  diclofenac sodium (VOLTAREN) 1 % GEL Apply 2 g topically 4 (four) times daily. Patient not taking: Reported on 12/30/2016 05/21/16   Debbe Odea, MD  potassium chloride SA (K-DUR,KLOR-CON) 20 MEQ tablet TAKE 2 TABLETS BY MOUTH TWICE DAILY Patient not taking: Reported on 12/30/2016 11/01/16   Evans Lance, MD  predniSONE (DELTASONE) 50 MG tablet 40 mg tomorrow- taper by 10 mg daily until finished Patient not taking: Reported on 12/30/2016 05/22/16   Debbe Odea, MD  valsartan (DIOVAN) 80 MG tablet Take 1 tablet (80 mg total) by mouth daily. Patient not taking: Reported on 12/30/2016 05/21/16   Debbe Odea, MD    Family History Family History  Problem Relation Age of Onset  . Heart attack Mother   . Heart attack Father   . Heart disease Neg Hx        Rheumatic or valvular heart disease   . Cardiomyopathy Neg Hx     Social History Social History  Substance Use Topics  . Smoking status: Former Smoker    Packs/day: 1.00    Years: 5.00    Quit date: 05/06/1961  . Smokeless tobacco: Former Systems developer    Quit date: 05/07/1951  . Alcohol use No     Allergies   Patient has no known allergies.   Review of Systems Review of Systems 10 Systems reviewed and are negative for acute change except as noted in the HPI.   Physical Exam Updated Vital  Signs BP (!) 83/62   Pulse (!) 59   Temp 97.7 F (36.5 C) (Oral)   Resp 13   SpO2 100%   Physical Exam  Constitutional: He is oriented to person, place, and time. He appears well-developed and well-nourished.  HENT:  Head: Normocephalic and atraumatic.  Mouth/Throat: Oropharynx is clear and moist.  Eyes: Conjunctivae and EOM are normal.  Neck: Neck supple.  Cardiovascular: Normal rate and regular rhythm.   No murmur heard. Pulmonary/Chest: Effort normal and breath sounds normal. No respiratory distress.  Abdominal: Soft. He exhibits no distension. There is no tenderness. There is no guarding.  Musculoskeletal: Normal range of motion. He exhibits no edema or tenderness.  No peripheral edema. No calf tenderness.  Neurological: He is alert and oriented to person, place, and time. No cranial nerve deficit. He exhibits normal muscle tone. Coordination normal.  Skin: Skin is warm and dry.  Psychiatric: He has a normal mood and affect.  Nursing note and vitals reviewed.    ED Treatments / Results  Labs (all labs ordered are listed, but only abnormal results are displayed) Labs Reviewed  CBC WITH DIFFERENTIAL/PLATELET - Abnormal; Notable for the following:       Result Value   RBC 4.14 (*)    Hemoglobin 12.6 (*)    HCT 38.6 (*)    All other components within normal limits  COMPREHENSIVE METABOLIC PANEL - Abnormal; Notable for the following:    Glucose, Bld 122 (*)    BUN 94 (*)    Creatinine, Ser 2.44 (*)    ALT 11 (*)    GFR calc non Af Amer 22 (*)    GFR calc Af Amer 26 (*)    All other components within normal limits  BRAIN NATRIURETIC PEPTIDE - Abnormal; Notable for the following:    B Natriuretic Peptide 228.6 (*)    All other components within normal limits  PROTIME-INR - Abnormal; Notable for the following:    Prothrombin Time 17.6 (*)    All other components within normal limits  URINALYSIS, ROUTINE W REFLEX MICROSCOPIC  I-STAT TROPONIN, ED  I-STAT CG4 LACTIC  ACID, ED    EKG  EKG Interpretation  Date/Time:  Monday December 30 2016 12:47:30 EDT Ventricular Rate:  63 PR Interval:    QRS Duration: 174 QT Interval:  478 QTC Calculation: 489 R Axis:   -99 Text Interpretation:  Ventricular-paced rhythm Abnormal ECG agree. no change from previous Confirmed by Charlesetta Shanks [phone]) on 12/30/2016 3:03:53 PM       Radiology Dg Chest 2 View  Result Date: 12/30/2016 CLINICAL DATA:  Dizziness, hypertension. History of CHF, cardiac dysrhythmias, mitral valve repair, coronary artery disease. EXAM: CHEST  2 VIEW COMPARISON:  Chest x-ray of May 18, 2016 FINDINGS: The lungs are well-expanded. The interstitial markings remain increased. There is no alveolar infiltrate or pleural effusion. The cardiac silhouette is enlarged. The pulmonary vascularity is not clearly engorged. The ICD is in stable position. The sternal wires are intact. A radiodense ring is present at the site of the mitral valve repair. There is calcification in the wall of the aortic arch. There are degenerative changes of both shoulders and at multiple thoracic disc levels. IMPRESSION: COPD with mild pulmonary fibrotic changes. Cardiomegaly without significant pulmonary vascular congestion today. Electronically Signed   By: David  Martinique M.D.   On: 12/30/2016 13:27    Procedures Procedures (including critical care time) CRITICAL CARE Performed by: Charlesetta Shanks   Total critical care time: 30 minutes  Critical care time was exclusive of separately billable procedures and treating other patients.  Critical care was necessary to treat or prevent imminent or life-threatening deterioration.  Critical care was time spent personally by me on the following activities: development of treatment plan with patient and/or surrogate as well as nursing, discussions with consultants, evaluation of patient's response to treatment, examination of patient, obtaining history from patient or surrogate,  ordering and performing treatments and interventions, ordering and review of laboratory studies, ordering and review of radiographic studies, pulse oximetry and re-evaluation of patient's condition. Medications Ordered in ED Medications  0.9 %  sodium chloride infusion (75 mL/hr Intravenous New Bag/Given 12/30/16 1551)  sodium chloride 0.9 % bolus 500 mL (0 mLs  Intravenous Stopped 12/30/16 1656)     Initial Impression / Assessment and Plan / ED Course  I have reviewed the triage vital signs and the nursing notes.  Pertinent labs & imaging results that were available during my care of the patient were reviewed by me and considered in my medical decision making (see chart for details).    Consult: Discussed with Ebony Hail, [facility] hospitalist service. Advise that patient may have blood pressure too low for stepdown.. Reviewed with Dr. Barbaraann Faster (16:40) we reviewed patient's clinical appearance which is stable, alert and without respiratory distress. Dr. Barbaraann Faster agreed with more gentle hydration given patient's history of congestive heart failure. Did request consultation to intensivist service prior to excepting to hospitalist service. Consult: (17:30)  Review Dr. Curt Jews, intensivist. Advised at this time holding diuretic and continuing with incremental rehydration. The patient is appropriate for step down on hospitalist service and can consult intensivist if any problems arise. Final Clinical Impressions(s) / ED Diagnoses   Final diagnoses:  Dehydration  AKI (acute kidney injury) ([place])  Weakness  Other specified hypotension  History of congestive heart failure   Rehydration started in the emergency department with 500 mL normal saline and base rate of 75 mL per hour. This appears most likely a combination of volume loss through continued compliance with diuretic and poor by mouth intake. He is alert and nontoxic. He does not have respiratory distress. He does not have peripheral edema.This time, no  signs of acute infection. Plan for admission for rehydration with concomitant history of congestive heart failure.  New Prescriptions New Prescriptions   No medications on file     Charlesetta Shanks, MD 12/30/16 1735

## 2016-12-30 NOTE — ED Notes (Signed)
Pt sent from [facility] for further evaluation of hypotension. He had a normal chest xray in the office on Friday, his BUN/Creatinine, and potassium were elevated on office labwork today

## 2016-12-31 DIAGNOSIS — R531 Weakness: Secondary | ICD-10-CM | POA: Diagnosis not present

## 2016-12-31 DIAGNOSIS — I130 Hypertensive heart and chronic kidney disease with heart failure and stage 1 through stage 4 chronic kidney disease, or unspecified chronic kidney disease: Secondary | ICD-10-CM | POA: Diagnosis present

## 2016-12-31 DIAGNOSIS — N179 Acute kidney failure, unspecified: Secondary | ICD-10-CM | POA: Diagnosis not present

## 2016-12-31 DIAGNOSIS — Z8249 Family history of ischemic heart disease and other diseases of the circulatory system: Secondary | ICD-10-CM | POA: Diagnosis not present

## 2016-12-31 DIAGNOSIS — I428 Other cardiomyopathies: Secondary | ICD-10-CM | POA: Diagnosis not present

## 2016-12-31 DIAGNOSIS — E860 Dehydration: Secondary | ICD-10-CM | POA: Diagnosis not present

## 2016-12-31 DIAGNOSIS — R634 Abnormal weight loss: Secondary | ICD-10-CM

## 2016-12-31 DIAGNOSIS — R0602 Shortness of breath: Secondary | ICD-10-CM | POA: Diagnosis not present

## 2016-12-31 DIAGNOSIS — I2510 Atherosclerotic heart disease of native coronary artery without angina pectoris: Secondary | ICD-10-CM | POA: Diagnosis present

## 2016-12-31 DIAGNOSIS — Z79899 Other long term (current) drug therapy: Secondary | ICD-10-CM | POA: Diagnosis not present

## 2016-12-31 DIAGNOSIS — I9589 Other hypotension: Secondary | ICD-10-CM | POA: Diagnosis not present

## 2016-12-31 DIAGNOSIS — N189 Chronic kidney disease, unspecified: Secondary | ICD-10-CM | POA: Diagnosis not present

## 2016-12-31 DIAGNOSIS — I429 Cardiomyopathy, unspecified: Secondary | ICD-10-CM | POA: Diagnosis present

## 2016-12-31 DIAGNOSIS — Z95810 Presence of automatic (implantable) cardiac defibrillator: Secondary | ICD-10-CM | POA: Diagnosis not present

## 2016-12-31 DIAGNOSIS — N183 Chronic kidney disease, stage 3 (moderate): Secondary | ICD-10-CM | POA: Diagnosis not present

## 2016-12-31 DIAGNOSIS — I5022 Chronic systolic (congestive) heart failure: Secondary | ICD-10-CM | POA: Diagnosis not present

## 2016-12-31 DIAGNOSIS — Z8679 Personal history of other diseases of the circulatory system: Secondary | ICD-10-CM | POA: Diagnosis not present

## 2016-12-31 DIAGNOSIS — E785 Hyperlipidemia, unspecified: Secondary | ICD-10-CM | POA: Diagnosis not present

## 2016-12-31 DIAGNOSIS — I482 Chronic atrial fibrillation: Secondary | ICD-10-CM | POA: Diagnosis not present

## 2016-12-31 DIAGNOSIS — I959 Hypotension, unspecified: Secondary | ICD-10-CM | POA: Diagnosis not present

## 2016-12-31 LAB — BASIC METABOLIC PANEL
Anion gap: 8 (ref 5–15)
BUN: 80 mg/dL — AB (ref 6–20)
CALCIUM: 9.1 mg/dL (ref 8.9–10.3)
CHLORIDE: 108 mmol/L (ref 101–111)
CO2: 25 mmol/L (ref 22–32)
CREATININE: 2.07 mg/dL — AB (ref 0.61–1.24)
GFR calc non Af Amer: 27 mL/min — ABNORMAL LOW (ref >60)
GFR, EST AFRICAN AMERICAN: 31 mL/min — AB (ref >60)
Glucose, Bld: 91 mg/dL (ref 65–99)
Potassium: 3.9 mmol/L (ref 3.5–5.1)
SODIUM: 141 mmol/L (ref 135–145)

## 2016-12-31 MED ORDER — FLUTICASONE PROPIONATE 50 MCG/ACT NA SUSP
2.0000 | Freq: Every day | NASAL | Status: DC
  Administered 2016-12-31 – 2017-01-02 (×3): 2 via NASAL
  Filled 2016-12-31 (×2): qty 16

## 2016-12-31 MED ORDER — POLYVINYL ALCOHOL 1.4 % OP SOLN
1.0000 [drp] | Freq: Every day | OPHTHALMIC | Status: DC
  Administered 2016-12-31 – 2017-01-02 (×3): 1 [drp] via OPHTHALMIC
  Filled 2016-12-31 (×2): qty 15

## 2016-12-31 NOTE — Evaluation (Signed)
Physical Therapy Evaluation Patient Details Name: Arthur Armstrong MRN: [id number] DOB: December 13, 1926 Today's Date: 12/31/2016   History of Present Illness  Arthur Armstrong is an 83 y.o. male with a past medical history of class II chronic systolic CHF, EF 63-01 percent by 2-D echo 05/20/16, status post biventricular ICD implantation, nonischemic cardiomyopathy, atrial fibrillation on Eliquis, hyperlipidemia, hypertension, history of mitral valve repair, adrenal mass who presented to his PCPs office on 12/27/16 for evaluation of a one-month history of weakness, loss of energy, loss of appetite and weight loss. He was noted to have low blood pressure during that visit, and was instructed to hold one of his blood pressure medications (Norvasc/Olmesartan), but he was not instructed to discontinue Lasix. He was reevaluated by his PCP earlier today and was noted to have ongoing hypotension, and was instructed to come to the ED for further evaluation.  Clinical Impression  Pt functioning near baseline. Pt with SOB with mobility however pt reports this to be normal. Pt on 2LO2 via [place] at home. Pt's BP is as noted below.  Supine 98/67 Sitting 104/69 Standing 98/71 During ambulation 113/64  Anticipate pt will progress quickly and return to baseline function. Acute PT to con't to follow pt.    Follow Up Recommendations No PT follow up;Supervision - Intermittent    Equipment Recommendations  None recommended by PT    Recommendations for Other Services       Precautions / Restrictions Precautions Precautions: Fall Precaution Comments: low BP/hypotension Restrictions Weight Bearing Restrictions: No      Mobility  Bed Mobility Overal bed mobility: Modified Independent             General bed mobility comments: hob elevated, used bed rails  Transfers Overall transfer level: Needs assistance Equipment used: None Transfers: Sit to/from Stand Sit to Stand: Min guard         General transfer  comment: pt with initiall posterior lean catching self on bed with back of legs, min guard for safety  Ambulation/Gait Ambulation/Gait assistance: Min guard Ambulation Distance (Feet): 175 Feet Assistive device: Straight cane Gait Pattern/deviations: Step-through pattern;Decreased stride length Gait velocity: decreased Gait velocity interpretation: Below normal speed for age/gender General Gait Details: no episode of LOB, milld SOB, 2/4 on dyspnea scale, BP 113/64. pt denies dizziness  Stairs            Wheelchair Mobility    Modified Rankin (Stroke Patients Only)       Balance Overall balance assessment: No apparent balance deficits (not formally assessed)                                           Pertinent Vitals/Pain Pain Assessment: 0-10 Pain Score: 0-No pain    Home Living Family/patient expects to be discharged to:: Private residence Living Arrangements: Alone Available Help at Discharge: Family;Friend(s);Available PRN/intermittently Type of Home: House Home Access: Stairs to enter Entrance Stairs-Rails: Right Entrance Stairs-Number of Steps: 3 Home Layout: Two level Home Equipment: Walker - 2 wheels;Cane - single point;Shower seat Additional Comments: pt reports 1 of his 5 daughters check on him daily. pt visits wife daily at [facility] and feeds her    Prior Function Level of Independence: Independent with assistive device(s)         Comments: Used the cane all the time     Hand Dominance   Dominant Hand: Right  Extremity/Trunk Assessment   Upper Extremity Assessment Upper Extremity Assessment: RUE deficits/detail RUE Deficits / Details: active shld flex to 80 deg, pt reports "thats my bad shoulder"    Lower Extremity Assessment Lower Extremity Assessment: Overall WFL for tasks assessed    Cervical / Trunk Assessment Cervical / Trunk Assessment: Kyphotic (forward head)  Communication   Communication: HOH  Cognition  Arousal/Alertness: Awake/alert Behavior During Therapy: WFL for tasks assessed/performed Overall Cognitive Status: Within Functional Limits for tasks assessed                                        General Comments      Exercises     Assessment/Plan    PT Assessment Patient needs continued PT services  PT Problem List Decreased strength;Decreased activity tolerance;Decreased balance;Decreased mobility;Cardiopulmonary status limiting activity       PT Treatment Interventions      PT Goals (Current goals can be found in the Care Plan section)  Acute Rehab PT Goals Patient Stated Goal: home PT Goal Formulation: With patient Time For Goal Achievement: 01/07/17 Potential to Achieve Goals: Good Additional Goals Additional Goal #1: Pt to score >19 on DGI to indicate minimal falls risk.    Frequency Min 3X/week   Barriers to discharge Decreased caregiver support      Co-evaluation               AM-PAC PT "6 Clicks" Daily Activity  Outcome Measure Difficulty turning over in bed (including adjusting bedclothes, sheets and blankets)?: Unable Difficulty moving from lying on back to sitting on the side of the bed? : Unable Difficulty sitting down on and standing up from a chair with arms (e.g., wheelchair, bedside commode, etc,.)?: A Little Help needed moving to and from a bed to chair (including a wheelchair)?: A Little Help needed walking in hospital room?: A Little Help needed climbing 3-5 steps with a railing? : A Lot 6 Click Score: 13    End of Session Equipment Utilized During Treatment: Gait belt Activity Tolerance: Patient tolerated treatment well Patient left: in chair;with call bell/phone within reach Nurse Communication: Mobility status PT Visit Diagnosis: Difficulty in walking, not elsewhere classified (R26.2)    Time: 9794-8016 PT Time Calculation (min) (ACUTE ONLY): 25 min   Charges:   PT Evaluation $PT Eval Low Complexity: 1 Low PT  Treatments $Gait Training: 8-22 mins   PT G Codes:   PT G-Codes **NOT FOR INPATIENT CLASS** Functional Assessment Tool Used: Clinical judgement Functional Limitation: Mobility: Walking and moving around Mobility: Walking and Moving Around Current Status ([id number]): At least 1 percent but less than 20 percent impaired, limited or restricted Mobility: Walking and Moving Around Goal Status [phone]): At least 1 percent but less than 20 percent impaired, limited or restricted    Kittie Plater, PT, DPT Pager #: [phone] Office #: [phone]   [place] 12/31/2016, 2:31 PM

## 2016-12-31 NOTE — ED Notes (Signed)
Pt returns to bed aftewr transported to bathroom via wheel chair. Denies complaint

## 2016-12-31 NOTE — Progress Notes (Signed)
Progress Note    Arthur Armstrong  ONG:[id number] DOB: 10/23/1926  DOA: 12/30/2016 PCP: Haywood Pao, MD    Brief Narrative:   Chief complaint: Follow-up weakness/fatigue  Medical records reviewed and are as summarized below:  Arthur Armstrong is an 83 y.o. male with a past medical history of class II chronic systolic CHF, EF 44-01 percent by 2-D echo 05/20/16, status post biventricular ICD implantation, nonischemic cardiomyopathy, atrial fibrillation on Eliquis, hyperlipidemia, hypertension, history of mitral valve repair, adrenal mass who presented to his PCPs office on 12/27/16 for evaluation of a one-month history of weakness, loss of energy, loss of appetite and weight loss. He was noted to have low blood pressure during that visit, and was instructed to hold one of his blood pressure medications (Norvasc/Olmesartan), but he was not instructed to discontinue Lasix. He was reevaluated by his PCP 12/30/16 and was noted to have ongoing hypotension, and was instructed to come to the ED for further evaluation. He was hypotensive on initial presentation and subsequently referred for inpatient evaluation.  Assessment/Plan:   Principal Problem:   Hypotension associated with AKI in a patient with stage III chronic kidney disease Baseline creatinine is 1.5-1.7. Current creatinine 2.44---> 2.07. AKI likely secondary to hypotension in the setting of multiple antihypertensives that can impact kidney function. Continue to hold antihypertensives and gently hydrate. Monitor closely for signs of volume overload given history of systolic CHF. BP soft, but improving.  Active Problems:   Weakness with weight loss Likely from hypotension and medication side effects. Will get dietitian evaluation for nutritional assessment as well as PT evaluation.    Atrial fibrillation ([place]) Anticoagulated chronically, and is ventricular paced.    Automatic implantable cardioverter-defibrillator in situ According  to Dr. Tanna Furry last clinic note, there have not been any episodes of ventricular tachycardia.    Dyslipidemia Not on statin therapy. Heart healthy diet.    Chronic systolic CHF (congestive heart failure) (HCC)/nonischemic cardiomyopathy No evidence of decompensation. Chest x-ray clear. Lungs clear. No reports of shortness of breath. Monitor closely while on IV fluids. Monitor daily weights and I/oh balance.   Family Communication/Anticipated D/C date and plan/Code Status   DVT prophylaxis: Eliquis ordered. Code Status: Full Code.  Family Communication: Daughter at bedside. Disposition Plan: Home when BP improved.   Medical Consultants:    None.   Anti-Infectives:    None  Subjective:   No complaints of dizziness, dyspnea, or chest pain.  Appetite OK.  Objective:    Vitals:   12/31/16 0700 12/31/16 0730 12/31/16 0745 12/31/16 0800  BP: (!) 83/60 (!) 89/69  (!) 86/61  Pulse: (!) 59 65 60 (!) 59  Resp: 14 19 15 17   Temp:      TempSrc:      SpO2: 98% 97% 97% 95%    Intake/Output Summary (Last 24 hours) at 12/31/16 0841 Last data filed at 12/31/16 0153  Gross per 24 hour  Intake           386.75 ml  Output                0 ml  Net           386.75 ml   There were no vitals filed for this visit.  Exam: General: No acute distress. Cardiovascular: Heart sounds show a regular rate, and rhythm. No gallops or rubs. No murmurs. No JVD. Lungs: Clear to auscultation bilaterally with good air movement. No rales, rhonchi or wheezes. Abdomen:  Soft, nontender, nondistended with normal active bowel sounds. No masses. No hepatosplenomegaly. Neurological: Alert and oriented 3. Moves all extremities 4 with equal strength. Cranial nerves II through XII grossly intact. Skin: Warm and dry. No rashes or lesions. Extremities: No clubbing or cyanosis. No edema. Pedal pulses 2+. Psychiatric: Mood and affect are normal. Insight and judgment are good.  Data Reviewed:   I have  personally reviewed following labs and imaging studies:  Labs: Labs show the following:   Basic Metabolic Panel:  Recent Labs Lab 12/30/16 1256 12/31/16 0419  NA 136 141  K 5.0 3.9  CL 104 108  CO2 23 25  GLUCOSE 122* 91  BUN 94* 80*  CREATININE 2.44* 2.07*  CALCIUM 9.5 9.1   GFR CrCl cannot be calculated (Unknown ideal weight.). Liver Function Tests:  Recent Labs Lab 12/30/16 1256  AST 16  ALT 11*  ALKPHOS 78  BILITOT 1.0  PROT 7.5  ALBUMIN 4.0   Coagulation profile  Recent Labs Lab 12/30/16 1600  INR 1.43    CBC:  Recent Labs Lab 12/30/16 1256  WBC 6.6  NEUTROABS 4.5  HGB 12.6*  HCT 38.6*  MCV 93.2  PLT 167   Sepsis Labs:  Recent Labs Lab 12/30/16 1256 12/30/16 1745  WBC 6.6  --   LATICACIDVEN  --  1.15    Microbiology No results found for this or any previous visit (from the past 240 hour(s)).  Procedures and diagnostic studies:  Dg Chest 2 View  Result Date: 12/30/2016 CLINICAL DATA:  Dizziness, hypertension. History of CHF, cardiac dysrhythmias, mitral valve repair, coronary artery disease. EXAM: CHEST  2 VIEW COMPARISON:  Chest x-ray of May 18, 2016 FINDINGS: The lungs are well-expanded. The interstitial markings remain increased. There is no alveolar infiltrate or pleural effusion. The cardiac silhouette is enlarged. The pulmonary vascularity is not clearly engorged. The ICD is in stable position. The sternal wires are intact. A radiodense ring is present at the site of the mitral valve repair. There is calcification in the wall of the aortic arch. There are degenerative changes of both shoulders and at multiple thoracic disc levels. IMPRESSION: COPD with mild pulmonary fibrotic changes. Cardiomegaly without significant pulmonary vascular congestion today. Electronically Signed   By: David  Martinique M.D.   On: 12/30/2016 13:27    Medications:   . apixaban  2.5 mg Oral BID  . fluticasone  2 spray Nasal Daily  . Polyethyl  Glycol-Propyl Glycol   Both Eyes Daily  . sodium chloride flush  3 mL Intravenous Q12H  . tamsulosin  0.4 mg Oral QHS   Continuous Infusions: . sodium chloride 75 mL/hr (12/31/16 0806)     LOS: 0 days   RAMA,CHRISTINA  [facility] Pager [phone]. If unable to reach me by pager, please call my cell phone at [phone].  *Please refer to [email or web address], password [email or web address] to get updated schedule on who will round on this patient, as hospitalists switch teams weekly. If 7PM-7AM, please contact night-coverage at [email or web address], password [email or web address] for any overnight needs.  12/31/2016, 8:41 AM

## 2016-12-31 NOTE — Care Management Obs Status (Signed)
[facility] NOTIFICATION   Patient Details  Name: Arthur Armstrong MRN: [id number] Date of Birth: [birth year]   Medicare Observation Status Notification Given:  Yes    Carles Collet, RN 12/31/2016, 11:42 AM

## 2016-12-31 NOTE — ED Notes (Signed)
Pt up to bathroom with nurse. Cardiac monitor and oxygen at side. No distress observed.

## 2017-01-01 DIAGNOSIS — N189 Chronic kidney disease, unspecified: Secondary | ICD-10-CM

## 2017-01-01 LAB — BASIC METABOLIC PANEL
Anion gap: 5 (ref 5–15)
BUN: 60 mg/dL — AB (ref 6–20)
CO2: 27 mmol/L (ref 22–32)
CREATININE: 1.63 mg/dL — AB (ref 0.61–1.24)
Calcium: 9 mg/dL (ref 8.9–10.3)
Chloride: 111 mmol/L (ref 101–111)
GFR calc Af Amer: 42 mL/min — ABNORMAL LOW (ref >60)
GFR, EST NON AFRICAN AMERICAN: 36 mL/min — AB (ref >60)
Glucose, Bld: 101 mg/dL — ABNORMAL HIGH (ref 65–99)
POTASSIUM: 4.1 mmol/L (ref 3.5–5.1)
SODIUM: 143 mmol/L (ref 135–145)

## 2017-01-01 MED ORDER — ENSURE ENLIVE PO LIQD
237.0000 mL | Freq: Three times a day (TID) | ORAL | Status: DC
  Administered 2017-01-01 – 2017-01-02 (×3): 237 mL via ORAL

## 2017-01-01 NOTE — Progress Notes (Signed)
PROGRESS NOTE    Arthur Armstrong  PNT:[id number] DOB: [birth year] DOA: 12/30/2016 PCP: Haywood Pao, MD    Brief Narrative: Arthur Armstrong is an 83 y.o. male with a past medical history of class II chronic systolic CHF, EF 08-67 percent by 2-D echo 05/20/16, status post biventricular ICD implantation, nonischemic cardiomyopathy, atrial fibrillation on Eliquis, hyperlipidemia, hypertension, history of mitral valve repair, adrenal mass who presented to his PCPs office on 12/27/16 for evaluation of a one-month history of weakness, loss of energy, loss of appetite and weight loss. He was found to be hypotensive and in acute renal failure. He was admitted for further evaluation.    Assessment & Plan:   Principal Problem:   Hypotension Active Problems:   Atrial fibrillation (HCC)   Automatic implantable cardioverter-defibrillator in situ   Dyslipidemia   Chronic systolic CHF (congestive heart failure) (HCC)   Nonischemic cardiomyopathy (HCC)   Acute renal failure superimposed on stage 3 chronic kidney disease (HCC)   Generalized weakness   Weight loss   Hypotension. Much improved.  MAP> 65.  Holding all bp meds at this time, asymptomatic at this time.    Atrial fibrillation:  Chronic, rate controlled and on eliquis for anti coagulation.    S/p AICD:  Stable.    Hyperlipidemia:  Lipid panel in am.    Chronic systolic heart failure:  He appears euvolemic.  On 2l it of [place] oxygen,.   Acute kidney injury secondary to hypoperfusion from hypotension:  Improved and almost back to baseline.  Gentle hydration and repeat renal parameters in am.      DVT prophylaxis: eliquis.  Code Status: (full code.  Family Communication: none at bedside. Disposition Plan: d/c in am.   Consultants:   None.    Procedures: none.   Antimicrobials: none.   Subjective: No dizziness, today but reports being tired with loss of energy.   Objective: Vitals:   01/01/17 0345 01/01/17  0521 01/01/17 0608 01/01/17 0638  BP: (!) 88/66  92/63 (!) 94/58  Pulse:      Resp:      Temp:  97.6 F (36.4 C)    TempSrc:  Oral    SpO2: 94%  97%   Weight:  62.9 kg (138 lb 10.7 oz)      Intake/Output Summary (Last 24 hours) at 01/01/17 1241 Last data filed at 01/01/17 0900  Gross per 24 hour  Intake          1883.75 ml  Output              900 ml  Net           983.75 ml   Filed Weights   01/01/17 0521  Weight: 62.9 kg (138 lb 10.7 oz)    Examination:  General exam: Appears calm and comfortable  Respiratory system: Clear to auscultation. Respiratory effort normal. Cardiovascular system: S1 & S2 heard, RRR. No JVD, murmurs, rubs, gallops or clicks. No pedal edema. Gastrointestinal system: Abdomen is nondistended, soft and nontender. No organomegaly or masses felt. Normal bowel sounds heard. Central nervous system: Alert and oriented. No focal neurological deficits. Extremities: Symmetric 5 x 5 power. Skin: No rashes, lesions or ulcers Psychiatry: Judgement and insight appear normal. Mood & affect appropriate.     Data Reviewed: I have personally reviewed following labs and imaging studies  CBC:  Recent Labs Lab 12/30/16 1256  WBC 6.6  NEUTROABS 4.5  HGB 12.6*  HCT 38.6*  MCV 93.2  PLT  532   Basic Metabolic Panel:  Recent Labs Lab 12/30/16 1256 12/31/16 0419 01/01/17 0259  NA 136 141 143  K 5.0 3.9 4.1  CL 104 108 111  CO2 23 25 27   GLUCOSE 122* 91 101*  BUN 94* 80* 60*  CREATININE 2.44* 2.07* 1.63*  CALCIUM 9.5 9.1 9.0   GFR: CrCl cannot be calculated (Unknown ideal weight.). Liver Function Tests:  Recent Labs Lab 12/30/16 1256  AST 16  ALT 11*  ALKPHOS 78  BILITOT 1.0  PROT 7.5  ALBUMIN 4.0   No results for input(s): LIPASE, AMYLASE in the last 168 hours. No results for input(s): AMMONIA in the last 168 hours. Coagulation Profile:  Recent Labs Lab 12/30/16 1600  INR 1.43   Cardiac Enzymes: No results for input(s): CKTOTAL,  CKMB, CKMBINDEX, TROPONINI in the last 168 hours. BNP (last 3 results) No results for input(s): PROBNP in the last 8760 hours. HbA1C: No results for input(s): HGBA1C in the last 72 hours. CBG: No results for input(s): GLUCAP in the last 168 hours. Lipid Profile: No results for input(s): CHOL, HDL, LDLCALC, TRIG, CHOLHDL, LDLDIRECT in the last 72 hours. Thyroid Function Tests: No results for input(s): TSH, T4TOTAL, FREET4, T3FREE, THYROIDAB in the last 72 hours. Anemia Panel: No results for input(s): VITAMINB12, FOLATE, FERRITIN, TIBC, IRON, RETICCTPCT in the last 72 hours. Sepsis Labs:  Recent Labs Lab 12/30/16 1745  LATICACIDVEN 1.15    No results found for this or any previous visit (from the past 240 hour(s)).       Radiology Studies: Dg Chest 2 View  Result Date: 12/30/2016 CLINICAL DATA:  Dizziness, hypertension. History of CHF, cardiac dysrhythmias, mitral valve repair, coronary artery disease. EXAM: CHEST  2 VIEW COMPARISON:  Chest x-ray of May 18, 2016 FINDINGS: The lungs are well-expanded. The interstitial markings remain increased. There is no alveolar infiltrate or pleural effusion. The cardiac silhouette is enlarged. The pulmonary vascularity is not clearly engorged. The ICD is in stable position. The sternal wires are intact. A radiodense ring is present at the site of the mitral valve repair. There is calcification in the wall of the aortic arch. There are degenerative changes of both shoulders and at multiple thoracic disc levels. IMPRESSION: COPD with mild pulmonary fibrotic changes. Cardiomegaly without significant pulmonary vascular congestion today. Electronically Signed   By: David  Martinique M.D.   On: 12/30/2016 13:27        Scheduled Meds: . apixaban  2.5 mg Oral BID  . fluticasone  2 spray Each Nare Daily  . polyvinyl alcohol  1 drop Both Eyes Daily  . sodium chloride flush  3 mL Intravenous Q12H  . tamsulosin  0.4 mg Oral QHS   Continuous  Infusions: . sodium chloride 75 mL/hr (12/31/16 1152)     LOS: 1 day    Time spent: 35 minutes.     Hosie Poisson, MD [facility] Pager [phone]   If 7PM-7AM, please contact night-coverage [email or web address] Password [email or web address] 01/01/2017, 12:41 PM

## 2017-01-01 NOTE — Progress Notes (Signed)
Initial Nutrition Assessment  DOCUMENTATION CODES:   Severe malnutrition in context of chronic illness  INTERVENTION:    Ensure Enlive po TID, each supplement provides 350 kcal and 20 grams of protein  NUTRITION DIAGNOSIS:   Malnutrition (severe) related to chronic illness (CHF, CAD) as evidenced by severe depletion of body fat, severe depletion of muscle mass, percent weight loss (14% weight loss in 6 months).  GOAL:   Patient will meet greater than or equal to 90% of their needs  MONITOR:   PO intake, Supplement acceptance  REASON FOR ASSESSMENT:   Consult Assessment of nutrition requirement/status  ASSESSMENT:   83 yo male with PMH of AICD, NICM, CHF, HTN, HLD, CAD, pacemaker, CKD III who was admitted on 8/27 with weakness and fatigue related to hypotension with AKI.   Spoke with patient and one of his daughters. Patient reports that he has been eating poorly at home, c/o no appetite. Patient lives alone and is too tired to cook sometimes. Family (5 daughters & 1 son) is planning to bring patient food 6 days per week. They have also purchased Boost supplement for patient to have at home. Since admission, patient has been eating very well, consuming 85-100% of meals. Nutrition-Focused physical exam completed. Findings are mild/moderate-severe fat depletion, mild/moderate-severe muscle depletion, and no edema.  Patient with significant weight loss, 14% over the past 6 months. Labs and medications reviewed.  Diet Order:  Diet Heart Room service appropriate? Yes; Fluid consistency: Thin  Skin:  Reviewed, no issues  Last BM:  8/28  Height:   Ht Readings from Last 1 Encounters:  01/01/17 5\' 6"  (1.676 m)    Weight:   Wt Readings from Last 1 Encounters:  01/01/17 138 lb 10.7 oz (62.9 kg)    Ideal Body Weight:  64.5 kg  BMI:  Body mass index is 22.38 kg/m.  Estimated Nutritional Needs:   Kcal:  1900-2100  Protein:  85-100 gm  Fluid:  1.9 L  EDUCATION  NEEDS:   Education needs addressed, discussed ways to increase protein and calorie intake.  Molli Barrows, RD, LDN, [place] Pager [phone] After Hours Pager [phone]

## 2017-01-02 ENCOUNTER — Inpatient Hospital Stay (HOSPITAL_COMMUNITY): Payer: Medicare Other

## 2017-01-02 LAB — BASIC METABOLIC PANEL
Anion gap: 7 (ref 5–15)
BUN: 41 mg/dL — AB (ref 6–20)
CHLORIDE: 112 mmol/L — AB (ref 101–111)
CO2: 25 mmol/L (ref 22–32)
CREATININE: 1.47 mg/dL — AB (ref 0.61–1.24)
Calcium: 9.4 mg/dL (ref 8.9–10.3)
GFR calc Af Amer: 47 mL/min — ABNORMAL LOW (ref >60)
GFR, EST NON AFRICAN AMERICAN: 41 mL/min — AB (ref >60)
Glucose, Bld: 218 mg/dL — ABNORMAL HIGH (ref 65–99)
Potassium: 4 mmol/L (ref 3.5–5.1)
SODIUM: 144 mmol/L (ref 135–145)

## 2017-01-02 MED ORDER — POTASSIUM CHLORIDE CRYS ER 20 MEQ PO TBCR: 10.0000 meq | EXTENDED_RELEASE_TABLET | Freq: Every day | ORAL | Status: DC

## 2017-01-02 MED ORDER — ENSURE ENLIVE PO LIQD: 237.0000 mL | Freq: Three times a day (TID) | ORAL | 12 refills | Status: AC

## 2017-01-02 MED ORDER — FUROSEMIDE 20 MG PO TABS
20.0000 mg | ORAL_TABLET | Freq: Once | ORAL | Status: AC
  Administered 2017-01-02: 20 mg via ORAL
  Filled 2017-01-02: qty 1

## 2017-01-02 MED ORDER — FUROSEMIDE 40 MG PO TABS: 20.0000 mg | ORAL_TABLET | Freq: Every day | ORAL | 0 refills | Status: DC

## 2017-01-02 NOTE — Consult Note (Signed)
           [facility] CM Primary Care Navigator  01/02/2017  KOREN SERMERSHEIM 20-Feb-1927 [id number]   Went to see patient at the bedside to identify possible discharge needs. Patient reports having weakness, loss of energy, loss of appetite, fatigue and low blood pressure that led to this admission.  Patientendorses Dr. Domenick Gong with [place] his primary care provider.  [facility] [address] [facility].   Patient's oldest daughter Freda Munro) ismanagingmedications for patient at home using "pill box" system filled weekly.  Oldest daughter also providestransportation to hisdoctors'appointments per patient.  Patient lives alone and independent with self care. He reports that daughters (has five of them) alternate in providing and assisting with his care needs at home.    Anticipated discharge plan is home according to patient.  Patientvoiced understanding to follow-up with primary care providerwhen hereturns backhome,for a post discharge follow-up within a week or sooner if needs arise.Patient letter (with PCP's contact number) was provided as areminder.  He mentioned that he has a schedule to see primary care provider on Tuesday 9/4.   Discussed with patient regarding [facility] CM services available for health management. Patient states that daughters are monitoring and helping him manage his health issues at home.  He expressed understanding to seekreferral  from primary care provider to [facility] care management ifdeemed necessary and appropriatefor services in the future.   Patient however, opted and verbally agreed to [facility] calls for follow upwith his recovery. Referral made for [facility] calls for post discharge follow-up.   For questions, please contact:  Dannielle Huh, BSN, RN- [facility] Primary Care Navigator  Telephone: [phone] [place]

## 2017-01-02 NOTE — Plan of Care (Signed)
Problem: Safety: Goal: Ability to remain free from injury will improve Outcome: Completed/Met Date Met: 01/02/17 Ambulates independently without difficulty.  Problem: Pain Managment: Goal: General experience of comfort will improve Outcome: Progressing Denies c/o pain or discomfort.  Problem: Physical Regulation: Goal: Ability to maintain clinical measurements within normal limits will improve Outcome: Progressing VSS.

## 2017-01-02 NOTE — Progress Notes (Signed)
Physical Therapy Treatment Patient Details Name: Arthur Armstrong MRN: [id number] DOB: 1926/12/31 Today's Date: 01/02/2017    History of Present Illness Arthur Armstrong is an 83 y.o. male with a past medical history of class II chronic systolic CHF, EF 62-70%,[id number] ICD, nonischemic cardiomyopathy, atrial fibrillation on Eliquis, hyperlipidemia, hypertension, history of mitral valve repair, adrenal mass. Pt went to PCP with weakness and fatigue, noted to be hypotensive and admitted    PT Comments    Pt very pleasant, moving well. No significant hypotension, dizziness or fatigue this session. Pt did require 4L [place] to maintain SpO2 90% and > with gait. Pt and daughter educated for current oxygen requirement and recommendation for pulse ox for home. Will continue to follow. Recommend daily ambulation with nursing  Supine BP 112/75 (86) Sitting 100/62 (75) Standing 102/67 (75) Walking 101/68 (80)  SpO2 dropping to 84% on 2L On 4L maintained 90-97% with gait    Follow Up Recommendations  No PT follow up;Supervision - Intermittent     Equipment Recommendations  None recommended by PT    Recommendations for Other Services       Precautions / Restrictions Precautions Precautions: Fall Precaution Comments: watch sats    Mobility  Bed Mobility Overal bed mobility: Modified Independent                Transfers Overall transfer level: Modified independent                  Ambulation/Gait Ambulation/Gait assistance: Min guard Ambulation Distance (Feet): 550 Feet Assistive device: Straight cane Gait Pattern/deviations: Step-through pattern;Decreased stride length   Gait velocity interpretation: Below normal speed for age/gender General Gait Details: pt initially using additional furniture support to clear room but able to ambulate in hall slowly but steadily with cane, did not challenge balance at this time. Noted desaturation with gait and required 4L with  activity. Cues for pursed lip breathing and activity modification   Stairs Stairs: Yes   Stair Management: Alternating pattern;Forwards;With cane;One rail Right Number of Stairs: 4 General stair comments: safety with mobility noted with use of rail   Wheelchair Mobility    Modified Rankin (Stroke Patients Only)       Balance Overall balance assessment: Needs assistance   Sitting balance-Leahy Scale: Good       Standing balance-Leahy Scale: Fair                              Cognition Arousal/Alertness: Awake/alert Behavior During Therapy: WFL for tasks assessed/performed Overall Cognitive Status: Within Functional Limits for tasks assessed                                        Exercises      General Comments        Pertinent Vitals/Pain Pain Assessment: No/denies pain    Home Living                      Prior Function            PT Goals (current goals can now be found in the care plan section) Progress towards PT goals: Progressing toward goals    Frequency           PT Plan Current plan remains appropriate    Co-evaluation  AM-PAC PT "6 Clicks" Daily Activity  Outcome Measure  Difficulty turning over in bed (including adjusting bedclothes, sheets and blankets)?: None Difficulty moving from lying on back to sitting on the side of the bed? : A Little Difficulty sitting down on and standing up from a chair with arms (e.g., wheelchair, bedside commode, etc,.)?: None Help needed moving to and from a bed to chair (including a wheelchair)?: None Help needed walking in hospital room?: A Little Help needed climbing 3-5 steps with a railing? : None 6 Click Score: 22    End of Session Equipment Utilized During Treatment: Gait belt;Oxygen Activity Tolerance: Patient tolerated treatment well Patient left: in chair;with call bell/phone within reach;with family/visitor present Nurse  Communication: Mobility status PT Visit Diagnosis: Difficulty in walking, not elsewhere classified (R26.2)     Time: 5009-3818 PT Time Calculation (min) (ACUTE ONLY): 27 min  Charges:  $Gait Training: 23-37 mins                    G Codes:       Elwyn Reach, PT [phone]   [place] 01/02/2017, 8:25 AM

## 2017-01-03 NOTE — Discharge Summary (Signed)
Physician Discharge Summary  NTHONY Armstrong YHC:[id number] DOB: 02-Mar-1927 DOA: 12/30/2016  PCP: Haywood Pao, MD  Admit date: 12/30/2016 Discharge date: 01/02/2017  Admitted From: Home.  Disposition:  Home.   Recommendations for Outpatient Follow-up:  1. Follow up with PCP in 1-2 weeks 2. Please obtain BMP/CBC in one week  Discharge Condition:stable.  CODE STATUS: full code.  Diet recommendation: Heart Healthy   Brief/Interim Summary: Arthur Armstrong an 83 y.o.malewith a past medical history of class II chronic systolic CHF, EF 51-76 percent by 2-D echo 05/20/16, status post biventricular ICD implantation, nonischemic cardiomyopathy, atrial fibrillation on Eliquis, hyperlipidemia, hypertension, history of mitral valve repair, adrenal mass who presented to his PCPs office on 12/27/16 for evaluation of a one-month history of weakness, loss of energy, loss of appetite and weight loss. He was found to be hypotensive and in acute renal failure. He was admitted for further evaluation.   Discharge Diagnoses:  Principal Problem:   Hypotension Active Problems:   Atrial fibrillation (HCC)   Automatic implantable cardioverter-defibrillator in situ   Dyslipidemia   Chronic systolic CHF (congestive heart failure) (HCC)   Nonischemic cardiomyopathy (HCC)   Acute renal failure superimposed on stage 3 chronic kidney disease (HCC)   Generalized weakness   Weight loss  Hypotension. Much improved. Suspect its from dehydration and anti hypertensive's.  MAP> 65.  Holding all bp meds at this time, asymptomatic at this time.   Restarted lasix at a lower dose on discharge and recommended to check bp everyday.    Atrial fibrillation:  Chronic, rate controlled and on eliquis for anti coagulation.    S/p AICD:  Stable.    Hyperlipidemia:  Recommend outpatient work up with a lipid panel.    Chronic systolic heart failure:  He appears euvolemic.  On 2l it of [place] oxygen,.    Acute kidney injury secondary to hypoperfusion from hypotension:  Improved and almost back to baseline.  Gentle hydration and repeat renal parameters in am show much improvement.     Discharge Instructions  Discharge Instructions    (HEART FAILURE PATIENTS) Call MD:  Anytime you have any of the following symptoms: 1) 3 pound weight gain in 24 hours or 5 pounds in 1 week 2) shortness of breath, with or without a dry hacking cough 3) swelling in the hands, feet or stomach 4) if you have to sleep on extra pillows at night in order to breathe.    Complete by:  As directed    Diet - low sodium heart healthy    Complete by:  As directed    Discharge instructions    Complete by:  As directed    Please follow up with PCP on monday, get BMP done at the office, to check renal parameters and potassium level.  We  Are holding coreg and diovan and starting low dose lasix from Sunday sep 2nd.  Please check your BP every day and note it down and bring it to PCP office.     Allergies as of 01/02/2017   No Known Allergies     Medication List    STOP taking these medications   azithromycin 250 MG tablet Commonly known as:  ZITHROMAX   carvedilol 6.25 MG tablet Commonly known as:  COREG   diclofenac sodium 1 % Gel Commonly known as:  VOLTAREN   predniSONE 50 MG tablet Commonly known as:  DELTASONE   valsartan 80 MG tablet Commonly known as:  DIOVAN     TAKE  these medications   acetaminophen 500 MG tablet Commonly known as:  TYLENOL Take 500 mg by mouth every 6 (six) hours as needed for mild pain.   ELIQUIS 2.5 MG Tabs tablet Generic drug:  apixaban TAKE 1 TABLET BY MOUTH TWICE DAILY   feeding supplement (ENSURE ENLIVE) Liqd Take 237 mLs by mouth 3 (three) times daily between meals.   FLOMAX 0.4 MG Caps capsule Generic drug:  tamsulosin Take 0.4 mg by mouth at bedtime.   fluticasone 27.5 MCG/SPRAY nasal spray Commonly known as:  VERAMYST Place 2 sprays into the nose  daily.   furosemide 40 MG tablet Commonly known as:  LASIX Take 0.5 tablets (20 mg total) by mouth daily. Start taking on:  01/05/2017 What changed:  how much to take  additional instructions  These instructions start on 01/05/2017. If you are unsure what to do until then, ask your doctor or other care provider.   potassium chloride SA 20 MEQ tablet Commonly known as:  K-DUR,KLOR-CON Take 0.5 tablets (10 mEq total) by mouth daily. Start taking on:  01/05/2017 What changed:  how much to take  additional instructions  These instructions start on 01/05/2017. If you are unsure what to do until then, ask your doctor or other care provider.  Another medication with the same name was removed. Continue taking this medication, and follow the directions you see here.   SYSTANE OP Place 1 drop into both eyes daily.            Discharge Care Instructions        Start     Ordered   01/05/17 0000  furosemide (LASIX) 40 MG tablet  Daily     01/02/17 1151   01/05/17 0000  potassium chloride SA (K-DUR,KLOR-CON) 20 MEQ tablet  Daily     01/02/17 1151   01/02/17 0000  feeding supplement, ENSURE ENLIVE, (ENSURE ENLIVE) LIQD  3 times daily between meals     01/02/17 1151   01/02/17 0000  Diet - low sodium heart healthy     01/02/17 1151   01/02/17 0000  (HEART FAILURE PATIENTS) Call MD:  Anytime you have any of the following symptoms: 1) 3 pound weight gain in 24 hours or 5 pounds in 1 week 2) shortness of breath, with or without a dry hacking cough 3) swelling in the hands, feet or stomach 4) if you have to sleep on extra pillows at night in order to breathe.     01/02/17 1151   01/02/17 0000  Discharge instructions    Comments:  Please follow up with PCP on monday, get BMP done at the office, to check renal parameters and potassium level.  We  Are holding coreg and diovan and starting low dose lasix from Sunday sep 2nd.  Please check your BP every day and note it down and bring it to PCP  office.   01/02/17 1151     Follow-up Information    Tisovec, Fransico Him, MD. Schedule an appointment as soon as possible for a visit in 3 day(s).   Specialty:  Internal Medicine Why:  follow up post hospitalization vist, for BMP check.  Contact information: [address] [place] [address] [phone]          No Known Allergies  Consultations:  None.    Procedures/Studies: Dg Chest 2 View  Result Date: 12/30/2016 CLINICAL DATA:  Dizziness, hypertension. History of CHF, cardiac dysrhythmias, mitral valve repair, coronary artery disease. EXAM: CHEST  2 VIEW COMPARISON:  Chest x-ray of May 18, 2016 FINDINGS: The lungs are well-expanded. The interstitial markings remain increased. There is no alveolar infiltrate or pleural effusion. The cardiac silhouette is enlarged. The pulmonary vascularity is not clearly engorged. The ICD is in stable position. The sternal wires are intact. A radiodense ring is present at the site of the mitral valve repair. There is calcification in the wall of the aortic arch. There are degenerative changes of both shoulders and at multiple thoracic disc levels. IMPRESSION: COPD with mild pulmonary fibrotic changes. Cardiomegaly without significant pulmonary vascular congestion today. Electronically Signed   By: David  Martinique M.D.   On: 12/30/2016 13:27   Dg Chest Port 1 View  Result Date: 01/02/2017 CLINICAL DATA:  Shortness of breath. History of cardiomyopathy and CHF and mitral valve repair EXAM: PORTABLE CHEST 1 VIEW COMPARISON:  PA and lateral chest x-ray of December 30, 2016 FINDINGS: The lungs are adequately inflated. The interstitial markings are mildly prominent though stable. There is mild central pulmonary vascular congestion that is more conspicuous today. The cardiac silhouette is enlarged. There is calcification in the wall of the aortic arch. The sternal wires are intact. A prosthetic mitral valve ring is visible. The ICD is in stable  position. IMPRESSION: Findings compatible with low-grade compensated CHF. No acute pneumonia. Overall there has not been significant interval change since the previous study. Electronically Signed   By: David  Martinique M.D.   On: 01/02/2017 10:48    (   Subjective: No new complaints,   Discharge Exam: Vitals:   01/02/17 0930 01/02/17 1117  BP: (!) 85/57 108/69  Pulse:    Resp:    Temp:    SpO2: 99%    Vitals:   01/02/17 0800 01/02/17 0822 01/02/17 0930 01/02/17 1117  BP: 102/67 101/68 (!) 85/57 108/69  Pulse:      Resp:      Temp:      TempSrc:      SpO2: 99% 98% 99%   Weight:      Height:        General: Pt is alert, awake, not in acute distress Cardiovascular: RRR, S1/S2 +, no rubs, no gallops Respiratory: CTA bilaterally, no wheezing, no rhonchi Abdominal: Soft, NT, ND, bowel sounds + Extremities: no edema, no cyanosis    The results of significant diagnostics from this hospitalization (including imaging, microbiology, ancillary and laboratory) are listed below for reference.     Microbiology: No results found for this or any previous visit (from the past 240 hour(s)).   Labs: BNP (last 3 results)  Recent Labs  05/18/16 2025 12/30/16 1600  BNP 153.6* 025.8*   Basic Metabolic Panel:  Recent Labs Lab 12/30/16 1256 12/31/16 0419 01/01/17 0259 01/02/17 1004  NA 136 141 143 144  K 5.0 3.9 4.1 4.0  CL 104 108 111 112*  CO2 23 25 27 25   GLUCOSE 122* 91 101* 218*  BUN 94* 80* 60* 41*  CREATININE 2.44* 2.07* 1.63* 1.47*  CALCIUM 9.5 9.1 9.0 9.4   Liver Function Tests:  Recent Labs Lab 12/30/16 1256  AST 16  ALT 11*  ALKPHOS 78  BILITOT 1.0  PROT 7.5  ALBUMIN 4.0   No results for input(s): LIPASE, AMYLASE in the last 168 hours. No results for input(s): AMMONIA in the last 168 hours. CBC:  Recent Labs Lab 12/30/16 1256  WBC 6.6  NEUTROABS 4.5  HGB 12.6*  HCT 38.6*  MCV 93.2  PLT 167   Cardiac Enzymes: No results  for input(s):  CKTOTAL, CKMB, CKMBINDEX, TROPONINI in the last 168 hours. BNP: Invalid input(s): POCBNP CBG: No results for input(s): GLUCAP in the last 168 hours. D-Dimer No results for input(s): DDIMER in the last 72 hours. Hgb A1c No results for input(s): HGBA1C in the last 72 hours. Lipid Profile No results for input(s): CHOL, HDL, LDLCALC, TRIG, CHOLHDL, LDLDIRECT in the last 72 hours. Thyroid function studies No results for input(s): TSH, T4TOTAL, T3FREE, THYROIDAB in the last 72 hours.  Invalid input(s): FREET3 Anemia work up No results for input(s): VITAMINB12, FOLATE, FERRITIN, TIBC, IRON, RETICCTPCT in the last 72 hours. Urinalysis    Component Value Date/Time   COLORURINE STRAW (A) 12/30/2016 1730   APPEARANCEUR CLEAR 12/30/2016 1730   LABSPEC 1.008 12/30/2016 1730   PHURINE 5.0 12/30/2016 1730   GLUCOSEU NEGATIVE 12/30/2016 1730   HGBUR NEGATIVE 12/30/2016 1730   BILIRUBINUR NEGATIVE 12/30/2016 1730   KETONESUR NEGATIVE 12/30/2016 1730   PROTEINUR NEGATIVE 12/30/2016 1730   UROBILINOGEN 0.2 02/27/2014 1343   NITRITE NEGATIVE 12/30/2016 1730   LEUKOCYTESUR NEGATIVE 12/30/2016 1730   Sepsis Labs Invalid input(s): PROCALCITONIN,  WBC,  LACTICIDVEN Microbiology No results found for this or any previous visit (from the past 240 hour(s)).   Time coordinating discharge: Over 30 minutes  SIGNED:   Hosie Poisson, MD  [facility] 01/03/2017, 8:40 AM Pager   If 7PM-7AM, please contact night-coverage [email or web address] Password [email or web address]

## 2017-01-07 DIAGNOSIS — R600 Localized edema: Secondary | ICD-10-CM | POA: Diagnosis not present

## 2017-01-07 DIAGNOSIS — N184 Chronic kidney disease, stage 4 (severe): Secondary | ICD-10-CM | POA: Diagnosis not present

## 2017-01-08 ENCOUNTER — Telehealth: Payer: Self-pay | Admitting: *Deleted

## 2017-01-08 NOTE — Telephone Encounter (Signed)
Remote monitoring alert- ICD is ERI as of 01/07/17.  I made Arthur Armstrong aware of alert, that the vibration will time out  After 16 notifications. Scheduling will call him to arrange an appt with Dr. Lovena Le to discuss the procedure. He verbalizes understanding.

## 2017-01-09 ENCOUNTER — Ambulatory Visit (INDEPENDENT_AMBULATORY_CARE_PROVIDER_SITE_OTHER): Payer: Medicare Other | Admitting: Internal Medicine

## 2017-01-09 ENCOUNTER — Encounter: Payer: Self-pay | Admitting: Internal Medicine

## 2017-01-09 VITALS — BP 102/70 | HR 57 | Ht 66.0 in | Wt 138.0 lb

## 2017-01-09 DIAGNOSIS — I4891 Unspecified atrial fibrillation: Secondary | ICD-10-CM

## 2017-01-09 DIAGNOSIS — I442 Atrioventricular block, complete: Secondary | ICD-10-CM

## 2017-01-09 LAB — CBC
Hematocrit: 30.6 % — ABNORMAL LOW (ref 37.5–51.0)
Hemoglobin: 10.4 g/dL — ABNORMAL LOW (ref 13.0–17.7)
MCH: 30.7 pg (ref 26.6–33.0)
MCHC: 34 g/dL (ref 31.5–35.7)
MCV: 90 fL (ref 79–97)
PLATELETS: 154 10*3/uL (ref 150–379)
RBC: 3.39 x10E6/uL — AB (ref 4.14–5.80)
RDW: 13.3 % (ref 12.3–15.4)
WBC: 6.1 10*3/uL (ref 3.4–10.8)

## 2017-01-09 LAB — BASIC METABOLIC PANEL
BUN/Creatinine Ratio: 26 — ABNORMAL HIGH (ref 10–24)
BUN: 37 mg/dL — ABNORMAL HIGH (ref 8–27)
CO2: 23 mmol/L (ref 20–29)
Calcium: 9.2 mg/dL (ref 8.6–10.2)
Chloride: 101 mmol/L (ref 96–106)
Creatinine, Ser: 1.43 mg/dL — ABNORMAL HIGH (ref 0.76–1.27)
GFR calc Af Amer: 50 mL/min/{1.73_m2} — ABNORMAL LOW (ref >59)
GFR calc non Af Amer: 43 mL/min/{1.73_m2} — ABNORMAL LOW (ref >59)
GLUCOSE: 92 mg/dL (ref 65–99)
POTASSIUM: 4.9 mmol/L (ref 3.5–5.2)
SODIUM: 138 mmol/L (ref 134–144)

## 2017-01-09 NOTE — Progress Notes (Signed)
HPI Mr. Conaway returns today for follow-up. He is an 83 year old man with complete heart block, chronic systolic heart failure, chronic atrial fibrillation, and a nonischemic cardiomyopathy. He has had a defibrillator now for over 12 years. His device has never gone off and he has never had an ICD shock. He has underlying complete heart block. The patient was recently hospitalized with hypovolemia and dehydration. He is been out for about one week. Overall he feels well but has had some weakening with his advanced age. He also complains of pain in his knee secondary to arthritis.  No Known Allergies   Current Outpatient Prescriptions  Medication Sig Dispense Refill  . acetaminophen (TYLENOL) 500 MG tablet Take 500 mg by mouth every 6 (six) hours as needed for mild pain.     Marland Kitchen ELIQUIS 2.5 MG TABS tablet TAKE 1 TABLET BY MOUTH TWICE DAILY 60 tablet 6  . feeding supplement, ENSURE ENLIVE, (ENSURE ENLIVE) LIQD Take 237 mLs by mouth 3 (three) times daily between meals. 237 mL 12  . fluticasone (VERAMYST) 27.5 MCG/SPRAY nasal spray Place 2 sprays into the nose daily.    . furosemide (LASIX) 40 MG tablet Take 40 mg by mouth daily.    Vladimir Faster Glycol-Propyl Glycol (SYSTANE OP) Place 1 drop into both eyes daily.    . potassium chloride SA (K-DUR,KLOR-CON) 20 MEQ tablet Take 20 mEq by mouth daily.    . Tamsulosin HCl (FLOMAX) 0.4 MG CAPS Take 0.4 mg by mouth at bedtime.       No current facility-administered medications for this visit.      Past Medical History:  Diagnosis Date  . Atrial fibrillation ([place])   . Automatic implantable cardiac defibrillator in situ    BiV pacer  . Chronic systolic CHF (congestive heart failure) ([place])   . Coronary artery disease, non-occlusive    cath 2006 pLM 20%, pLAD 40%; mCFX 20%  . Hernia, ventral   . HLD (hyperlipidemia)   . HTN (hypertension)   . ICD (implantable cardiac defibrillator) in place    BIV ICD  . NICM (nonischemic cardiomyopathy)  ([place])    nicm ef 30-35%,nonob cad-cath 2006,echo 2/12:ef30-35%.mod lvh,inf hk,mild-mod lae and mild rae,pasp 35  . Pacemaker    BIV ICD  . PFO (patent foramen ovale)    s/p repair 2006  . S/P mitral valve repair    2006  . Shortness of breath   . Systolic CHF ([place])   . Unspecified essential hypertension     ROS:   All systems reviewed and negative except as noted in the HPI.   Past Surgical History:  Procedure Laterality Date  . BILATERAL KNEE ARTHROSCOPY    . COLONOSCOPY WITH POLYPECTOMY    . HIP PINNING  3/2/012  . IMPLANTATION OF A DUAL CHAMBER BIVENTRICULAR ICD    . INTRAOPERATIVE TRANSESOPHAGEAL ECHOCARDIOGRAM    . MEDIAN STEROTOMY FOR MITRAL VALVE REPAIR    . RT. SHOULDER       Family History  Problem Relation Age of Onset  . Heart attack Mother   . Heart attack Father   . Heart disease Neg Hx        Rheumatic or valvular heart disease   . Cardiomyopathy Neg Hx      Social History   Social History  . Marital status: Married    Spouse name: N/A  . Number of children: 6  . Years of education: N/A   Occupational History  . 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 Smoking status: Former Smoker    Packs/day: 1.00    Years: 5.00    Quit date: 05/06/1961  . Smokeless tobacco: Former Systems developer    Quit date: 05/07/1951  . Alcohol use No  . Drug use: No  . Sexual activity: Not Currently   Other Topics Concern  . Not on file   Social History Narrative   Lives with his wife in [place].   Has 6 grown children.   Remains quite active.     BP 102/70   Pulse (!) 57   Ht 5\' 6"  (1.676 m)   Wt 138 lb (62.6 kg)   SpO2 97%   BMI 22.27 kg/m   Physical Exam:  Well appearing 83 year old man, NAD HEENT: Unremarkable Neck:  7 cm JVD, no thyromegally Lymphatics:  No adenopathy Back:  No CVA tenderness Lungs:  Clear, with no wheezes, rales, or rhonchi. HEART:  Regular rate rhythm, no  murmurs, no rubs, no clicks Abd:  soft, positive bowel sounds, no organomegally, no rebound, no guarding Ext:  2 plus pulses, 1+ peripheral edema, no cyanosis, no clubbing Skin:  No rashes no nodules Neuro:  CN II through XII intact, motor grossly intact   DEVICE  Normal device function.  See PaceArt for details.   Assess/Plan: 1. Complete heart block - he is status post biventricular pacing. 2. Biventricular ICD - his device is at elective replacement. I plan to downgraded the patient from a biventricular ICD to a biventricular pacemaker, providing that his pacemaker options for biventricular pacing are satisfactory. He is currently pacing LV ring to ICD coil which is not an option with the biventricular pacemaker. 3. Chronic atrial fibrillation - his ventricular rate is well controlled. He will hold his anticoagulation for a day prior to his procedure. 4. Chronic systolic heart failure - his symptoms are class II. He is encouraged to maintain a low-sodium diet. He will continue his current medical therapy.  [name], M.D.

## 2017-01-09 NOTE — Patient Instructions (Signed)
Medication Instructions:  Your physician recommends that you continue on your current medications as directed. Please refer to the Current Medication list given to you today.  Labwork: Get a CBC and BMET today.  Testing/Procedures:  Your physician has recommended that you have a pacemaker inserted. A pacemaker is a small device that is placed under the skin of your chest or abdomen to help control abnormal heart rhythms. This device uses electrical pulses to prompt the heart to beat at a normal rate. Pacemakers are used to treat heart rhythms that are too slow. Wire (leads) are attached to the pacemaker that goes into the chambers of you heart. This is done in the hospital and usually requires and overnight stay. Please see the instruction sheet given to you today for more information.  You are scheduled for a generator change from a BiV defibrillator to a BiV pacemaker on 01/17/2017 at 10:00 am.  You will arrive to short stay at 8:00 am.  Follow-Up:  Follow up with the device clinic for a wound check 10-14 days after your procedure.  Follow up with Dr. Lovena Le 91 days after your procedure.  Any Other Special Instructions Will Be Listed Below (If Applicable).  Please arrive at the [facility] main entrance of [place] [place] [facility] at:  8:00 am on 01/17/2017 Use the surgical scrub given to you today with directions Do not eat or drink after midnight prior to procedure Do not take any medications the morning of the procedure HOLD YOUR ELIQUIS 24 HOURS PRIOR TO YOUR PROCEDURE.  YOUR LAST DOSE OF ELIQUIS WILL BE Thursday MORNING ON 01/16/2017 Plan for one night stay but you will probably be discharged same day You will need someone to drive you home at discharge    If you need a refill on your cardiac medications before your next appointment, please call your pharmacy.

## 2017-01-13 LAB — CUP PACEART INCLINIC DEVICE CHECK
HighPow Impedance: 38 Ohm
Implantable Lead Implant Date: 20060802
Implantable Lead Implant Date: 20060802
Implantable Lead Location: 753860
Implantable Lead Model: 7001
Implantable Pulse Generator Implant Date: 20110926
Lead Channel Impedance Value: 390 Ohm
Lead Channel Impedance Value: 460 Ohm
Lead Channel Setting Pacing Amplitude: 2.5 V
MDC IDC LEAD IMPLANT DT: 20060802
MDC IDC LEAD LOCATION: 753858
MDC IDC LEAD LOCATION: 753859
MDC IDC MSMT LEADCHNL LV IMPEDANCE VALUE: 530 Ohm
MDC IDC PG SERIAL: 623851
MDC IDC SESS DTM: 20180910160915
MDC IDC SET LEADCHNL LV PACING PULSEWIDTH: 0.8 ms
MDC IDC SET LEADCHNL RA PACING AMPLITUDE: 2 V
MDC IDC SET LEADCHNL RV PACING AMPLITUDE: 2.5 V
MDC IDC SET LEADCHNL RV PACING PULSEWIDTH: 0.5 ms
MDC IDC SET LEADCHNL RV SENSING SENSITIVITY: 0.5 mV
MDC IDC STAT BRADY RA PERCENT PACED: 1 % — AB
MDC IDC STAT BRADY RV PERCENT PACED: 94 %

## 2017-01-17 ENCOUNTER — Telehealth: Payer: Self-pay

## 2017-01-17 NOTE — Telephone Encounter (Signed)
Attempted to cal Pt.  Phone rang, no answer and no VM.  Call placed to Mildred contact.  Asked Arthur Armstrong if she would like to come earlier on Monday for gen change.  Arthur Armstrong states yes she would. Pt rescheduled for 11:00 time slot on Monday 01/20/2017.  Per Arthur Armstrong she will let father know.  No further action at this time.

## 2017-01-20 ENCOUNTER — Encounter (HOSPITAL_COMMUNITY)
Admission: RE | Disposition: A | Discharge status: Home / Self Care | Payer: Self-pay | Source: Ambulatory Visit | Attending: Internal Medicine

## 2017-01-20 ENCOUNTER — Encounter (HOSPITAL_COMMUNITY): Payer: Self-pay | Admitting: Internal Medicine

## 2017-01-20 ENCOUNTER — Ambulatory Visit (HOSPITAL_COMMUNITY)
Admission: RE | Admit: 2017-01-20 | Discharge: 2017-01-20 | Disposition: A | Discharge status: Home / Self Care | Payer: Medicare Other | Source: Ambulatory Visit | Attending: Internal Medicine | Admitting: Internal Medicine

## 2017-01-20 DIAGNOSIS — Z45010 Encounter for checking and testing of cardiac pacemaker pulse generator [battery]: Secondary | ICD-10-CM | POA: Diagnosis not present

## 2017-01-20 DIAGNOSIS — I2510 Atherosclerotic heart disease of native coronary artery without angina pectoris: Secondary | ICD-10-CM | POA: Diagnosis not present

## 2017-01-20 DIAGNOSIS — Z87891 Personal history of nicotine dependence: Secondary | ICD-10-CM | POA: Diagnosis not present

## 2017-01-20 DIAGNOSIS — I5022 Chronic systolic (congestive) heart failure: Secondary | ICD-10-CM | POA: Diagnosis present

## 2017-01-20 DIAGNOSIS — I4891 Unspecified atrial fibrillation: Secondary | ICD-10-CM | POA: Diagnosis present

## 2017-01-20 DIAGNOSIS — I429 Cardiomyopathy, unspecified: Secondary | ICD-10-CM | POA: Insufficient documentation

## 2017-01-20 DIAGNOSIS — Z4502 Encounter for adjustment and management of automatic implantable cardiac defibrillator: Secondary | ICD-10-CM | POA: Insufficient documentation

## 2017-01-20 DIAGNOSIS — I482 Chronic atrial fibrillation: Secondary | ICD-10-CM | POA: Diagnosis not present

## 2017-01-20 DIAGNOSIS — Z79899 Other long term (current) drug therapy: Secondary | ICD-10-CM | POA: Diagnosis not present

## 2017-01-20 DIAGNOSIS — I442 Atrioventricular block, complete: Secondary | ICD-10-CM

## 2017-01-20 DIAGNOSIS — I110 Hypertensive heart disease with heart failure: Secondary | ICD-10-CM | POA: Insufficient documentation

## 2017-01-20 DIAGNOSIS — Z7983 Long term (current) use of bisphosphonates: Secondary | ICD-10-CM | POA: Diagnosis not present

## 2017-01-20 DIAGNOSIS — E785 Hyperlipidemia, unspecified: Secondary | ICD-10-CM | POA: Diagnosis not present

## 2017-01-20 HISTORY — PX: BIV PACEMAKER GENERATOR CHANGEOUT: EP1198

## 2017-01-20 LAB — SURGICAL PCR SCREEN
MRSA, PCR: NEGATIVE
Staphylococcus aureus: NEGATIVE

## 2017-01-20 SURGERY — BIV PACEMAKER GENERATOR CHANGEOUT

## 2017-01-20 MED ORDER — CEFAZOLIN SODIUM-DEXTROSE 2-4 GM/100ML-% IV SOLN
2.0000 g | INTRAVENOUS | Status: AC
  Administered 2017-01-20: 2 g via INTRAVENOUS

## 2017-01-20 MED ORDER — FENTANYL CITRATE (PF) 100 MCG/2ML IJ SOLN
INTRAMUSCULAR | Status: AC
  Filled 2017-01-20: qty 2

## 2017-01-20 MED ORDER — SODIUM CHLORIDE 0.9 % IV SOLN
INTRAVENOUS | Status: DC
  Administered 2017-01-20: 10:00:00 via INTRAVENOUS

## 2017-01-20 MED ORDER — FENTANYL CITRATE (PF) 100 MCG/2ML IJ SOLN
INTRAMUSCULAR | Status: DC | PRN
  Administered 2017-01-20: 12.5 ug via INTRAVENOUS

## 2017-01-20 MED ORDER — MIDAZOLAM HCL 5 MG/5ML IJ SOLN
INTRAMUSCULAR | Status: DC | PRN
  Administered 2017-01-20: 1 mg via INTRAVENOUS

## 2017-01-20 MED ORDER — MIDAZOLAM HCL 5 MG/5ML IJ SOLN
INTRAMUSCULAR | Status: AC
  Filled 2017-01-20: qty 5

## 2017-01-20 MED ORDER — SODIUM CHLORIDE 0.9 % IR SOLN
80.0000 mg | Status: AC
  Administered 2017-01-20: 80 mg

## 2017-01-20 MED ORDER — ONDANSETRON HCL 4 MG/2ML IJ SOLN: 4.0000 mg | Freq: Four times a day (QID) | INTRAMUSCULAR | Status: DC | PRN

## 2017-01-20 MED ORDER — CHLORHEXIDINE GLUCONATE 4 % EX LIQD: 60.0000 mL | Freq: Once | CUTANEOUS | Status: DC

## 2017-01-20 MED ORDER — MUPIROCIN 2 % EX OINT
TOPICAL_OINTMENT | CUTANEOUS | Status: AC
  Administered 2017-01-20: 1
  Filled 2017-01-20: qty 22

## 2017-01-20 MED ORDER — LIDOCAINE HCL (PF) 1 % IJ SOLN
INTRAMUSCULAR | Status: DC | PRN
  Administered 2017-01-20: 45 mL

## 2017-01-20 MED ORDER — CEFAZOLIN SODIUM-DEXTROSE 2-4 GM/100ML-% IV SOLN
INTRAVENOUS | Status: AC
  Filled 2017-01-20: qty 100

## 2017-01-20 MED ORDER — ACETAMINOPHEN 325 MG PO TABS: 325.0000 mg | ORAL_TABLET | ORAL | Status: DC | PRN

## 2017-01-20 MED ORDER — SODIUM CHLORIDE 0.9 % IR SOLN
Status: AC
  Filled 2017-01-20: qty 2

## 2017-01-20 MED ORDER — LIDOCAINE HCL (PF) 1 % IJ SOLN
INTRAMUSCULAR | Status: AC
  Filled 2017-01-20: qty 60

## 2017-01-20 SURGICAL SUPPLY — 5 items
CABLE SURGICAL S-101-97-12 (CABLE) ×1 IMPLANT
PACEMAKER ALLR CRT-P RF PM3222 (Pacemaker) IMPLANT
PAD DEFIB LIFELINK (PAD) ×1 IMPLANT
PPM ALLURE CRT-P RF PM3222 (Pacemaker) ×2 IMPLANT
TRAY PACEMAKER INSERTION (PACKS) ×1 IMPLANT

## 2017-01-20 NOTE — Interval H&P Note (Signed)
History and Physical Interval Note:  01/20/2017 11:03 AM  Arthur Armstrong  has presented today for surgery, with the diagnosis of ERI  The various methods of treatment have been discussed with the patient and family. After consideration of risks, benefits and other options for treatment, the patient has consented to  Procedure(s): [place] (N/A) as a surgical intervention .  The patient's history has been reviewed, patient examined, no change in status, stable for surgery.  I have reviewed the patient's chart and labs.  Questions were answered to the patient's satisfaction.     [name]

## 2017-01-20 NOTE — Discharge Instructions (Signed)
Pacemaker Battery Change, Care After This sheet gives you information about how to care for yourself after your procedure. Your health care provider may also give you more specific instructions. If you have problems or questions, contact your health care provider. What can I expect after the procedure? After your procedure, it is common to have:  Pain or soreness at the site where the pacemaker was inserted.  Swelling at the site where the pacemaker was inserted.  Follow these instructions at home: Incision care  Keep the incision clean and dry. ? DO NOT GET SITE WET FOR ONE WEEK; REMOVE PRESSURE DRESSING IN 2 DAYS AND DRESSING OVER SITE AND LEAVE STERI-STRIPS ON.  Do not rub the area. This may cause bleeding.  Follow instructions from your health care provider about how to take care of your incision. Make sure you: ? Wash your hands with soap and water before you change your bandage (dressing). If soap and water are not available, use hand sanitizer. ? Change your dressing as told by your health care provider. ? Leave stitches (sutures), skin glue, or adhesive strips in place. These skin closures may need to stay in place for 2 weeks or longer. If adhesive strip edges start to loosen and curl up, you may trim the loose edges. Do not remove adhesive strips completely unless your health care provider tells you to do that.  Check your incision area every day for signs of infection. Check for: ? More redness, swelling, or pain. ? More fluid or blood. ? Warmth. ? Pus or a bad smell. Activity  Do not lift anything that is heavier than 10 lb (4.5 kg) until your health care provider says it is okay to do so.  For the first 2 weeks, or as long as told by your health care provider: ? Avoid lifting your left arm higher than your shoulder. ? Be gentle when you move your arms over your head. It is okay to raise your arm to comb your hair. ? Avoid strenuous exercise.  Ask your health care  provider when it is okay to: ? Resume your normal activities. ? Return to work or school. ? Resume sexual activity. Eating and drinking  Eat a heart-healthy diet. This should include plenty of fresh fruits and vegetables, whole grains, low-fat dairy products, and lean protein like chicken and fish.  Limit alcohol intake to no more than 1 drink a day for non-pregnant women and 2 drinks a day for men. One drink equals 12 oz of beer, 5 oz of wine, or 1 oz of hard liquor.  Check ingredients and nutrition facts on packaged foods and beverages. Avoid the following types of food: ? Food that is high in salt (sodium). ? Food that is high in saturated fat, like full-fat dairy or red meat. ? Food that is high in trans fat, like fried food. ? Food and drinks that are high in sugar. Lifestyle  Do not use any products that contain nicotine or tobacco, such as cigarettes and e-cigarettes. If you need help quitting, ask your health care provider.  Take steps to manage and control your weight.  Get regular exercise. Aim for 150 minutes of moderate-intensity exercise (such as walking or yoga) or 75 minutes of vigorous exercise (such as running or swimming) each week.  Manage other health problems, such as diabetes or high blood pressure. Ask your health care provider how you can manage these conditions. General instructions  Do not drive for 24 hours after your  procedure if you were given a medicine to help you relax (sedative).  Take over-the-counter and prescription medicines only as told by your health care provider.  Avoid putting pressure on the area where the pacemaker was placed.  If you need an MRI after your pacemaker has been placed, be sure to tell the health care provider who orders the MRI that you have a pacemaker.  Avoid close and prolonged exposure to electrical devices that have strong magnetic fields. These include: ? Cell phones. Avoid keeping them in a pocket near the  pacemaker, and try using the ear opposite the pacemaker. ? MP3 players. ? Household appliances, like microwaves. ? Metal detectors. ? Electric generators. ? High-tension wires.  Keep all follow-up visits as directed by your health care provider. This is important. Contact a health care provider if:  You have pain at the incision site that is not relieved by over-the-counter or prescription medicines.  You have any of these around your incision site or coming from it: ? More redness, swelling, or pain. ? Fluid or blood. ? Warmth to the touch. ? Pus or a bad smell.  You have a fever.  You feel brief, occasional palpitations, light-headedness, or any symptoms that you think might be related to your heart. Get help right away if:  You experience chest pain that is different from the pain at the pacemaker site.  You develop a red streak that extends above or below the incision site.  You experience shortness of breath.  You have palpitations or an irregular heartbeat.  You have light-headedness that does not go away quickly.  You faint or have dizzy spells.  Your pulse suddenly drops or increases rapidly and does not return to normal.  You begin to gain weight and your legs and ankles swell. Summary  After your procedure, it is common to have pain, soreness, and some swelling where the pacemaker was inserted.  Make sure to keep your incision clean and dry. Follow instructions from your health care provider about how to take care of your incision.  Check your incision every day for signs of infection, such as more pain or swelling, pus or a bad smell, warmth, or leaking fluid and blood.  Avoid strenuous exercise and lifting your left arm higher than your shoulder for 2 weeks, or as long as told by your health care provider. This information is not intended to replace advice given to you by your health care provider. Make sure you discuss any questions you have with your  health care provider. Document Released: 02/10/2013 Document Revised: 03/14/2016 Document Reviewed: 03/14/2016 Elsevier Interactive Patient Education  2017 [employer].

## 2017-01-20 NOTE — H&P (View-Only) (Signed)
HPI Arthur Armstrong returns today for follow-up. He is an 83 year old man with complete heart block, chronic systolic heart failure, chronic atrial fibrillation, and a nonischemic cardiomyopathy. He has had a defibrillator now for over 12 years. His device has never gone off and he has never had an ICD shock. He has underlying complete heart block. The patient was recently hospitalized with hypovolemia and dehydration. He is been out for about one week. Overall he feels well but has had some weakening with his advanced age. He also complains of pain in his knee secondary to arthritis.  No Known Allergies   Current Outpatient Prescriptions  Medication Sig Dispense Refill  . acetaminophen (TYLENOL) 500 MG tablet Take 500 mg by mouth every 6 (six) hours as needed for mild pain.     Marland Kitchen ELIQUIS 2.5 MG TABS tablet TAKE 1 TABLET BY MOUTH TWICE DAILY 60 tablet 6  . feeding supplement, ENSURE ENLIVE, (ENSURE ENLIVE) LIQD Take 237 mLs by mouth 3 (three) times daily between meals. 237 mL 12  . fluticasone (VERAMYST) 27.5 MCG/SPRAY nasal spray Place 2 sprays into the nose daily.    . furosemide (LASIX) 40 MG tablet Take 40 mg by mouth daily.    Arthur Armstrong Glycol-Propyl Glycol (SYSTANE OP) Place 1 drop into both eyes daily.    . potassium chloride SA (K-DUR,KLOR-CON) 20 MEQ tablet Take 20 mEq by mouth daily.    . Tamsulosin HCl (FLOMAX) 0.4 MG CAPS Take 0.4 mg by mouth at bedtime.       No current facility-administered medications for this visit.      Past Medical History:  Diagnosis Date  . Atrial fibrillation ([place])   . Automatic implantable cardiac defibrillator in situ    BiV pacer  . Chronic systolic CHF (congestive heart failure) ([place])   . Coronary artery disease, non-occlusive    cath 2006 pLM 20%, pLAD 40%; mCFX 20%  . Hernia, ventral   . HLD (hyperlipidemia)   . HTN (hypertension)   . ICD (implantable cardiac defibrillator) in place    BIV ICD  . NICM (nonischemic cardiomyopathy)  ([place])    nicm ef 30-35%,nonob cad-cath 2006,echo 2/12:ef30-35%.mod lvh,inf hk,mild-mod lae and mild rae,pasp 35  . Pacemaker    BIV ICD  . PFO (patent foramen ovale)    s/p repair 2006  . S/P mitral valve repair    2006  . Shortness of breath   . Systolic CHF ([place])   . Unspecified essential hypertension     ROS:   All systems reviewed and negative except as noted in the HPI.   Past Surgical History:  Procedure Laterality Date  . BILATERAL KNEE ARTHROSCOPY    . COLONOSCOPY WITH POLYPECTOMY    . HIP PINNING  3/2/012  . IMPLANTATION OF A DUAL CHAMBER BIVENTRICULAR ICD    . INTRAOPERATIVE TRANSESOPHAGEAL ECHOCARDIOGRAM    . MEDIAN STEROTOMY FOR MITRAL VALVE REPAIR    . RT. SHOULDER       Family History  Problem Relation Age of Onset  . Heart attack Mother   . Heart attack Father   . Heart disease Neg Hx        Rheumatic or valvular heart disease   . Cardiomyopathy Neg Hx      Social History   Social History  . Marital status: Married    Spouse name: N/A  . Number of children: 6  . Years of education: N/A   Occupational History  . 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 Smoking status: Former Smoker    Packs/day: 1.00    Years: 5.00    Quit date: 05/06/1961  . Smokeless tobacco: Former Systems developer    Quit date: 05/07/1951  . Alcohol use No  . Drug use: No  . Sexual activity: Not Currently   Other Topics Concern  . Not on file   Social History Narrative   Lives with his wife in [place].   Has 6 grown children.   Remains quite active.     BP 102/70   Pulse (!) 57   Ht 5\' 6"  (1.676 m)   Wt 138 lb (62.6 kg)   SpO2 97%   BMI 22.27 kg/m   Physical Exam:  Well appearing 83 year old man, NAD HEENT: Unremarkable Neck:  7 cm JVD, no thyromegally Lymphatics:  No adenopathy Back:  No CVA tenderness Lungs:  Clear, with no wheezes, rales, or rhonchi. HEART:  Regular rate rhythm, no  murmurs, no rubs, no clicks Abd:  soft, positive bowel sounds, no organomegally, no rebound, no guarding Ext:  2 plus pulses, 1+ peripheral edema, no cyanosis, no clubbing Skin:  No rashes no nodules Neuro:  CN II through XII intact, motor grossly intact   DEVICE  Normal device function.  See PaceArt for details.   Assess/Plan: 1. Complete heart block - he is status post biventricular pacing. 2. Biventricular ICD - his device is at elective replacement. I plan to downgraded the patient from a biventricular ICD to a biventricular pacemaker, providing that his pacemaker options for biventricular pacing are satisfactory. He is currently pacing LV ring to ICD coil which is not an option with the biventricular pacemaker. 3. Chronic atrial fibrillation - his ventricular rate is well controlled. He will hold his anticoagulation for a day prior to his procedure. 4. Chronic systolic heart failure - his symptoms are class II. He is encouraged to maintain a low-sodium diet. He will continue his current medical therapy.  [name], M.D.

## 2017-01-21 ENCOUNTER — Telehealth: Payer: Self-pay | Admitting: Internal Medicine

## 2017-01-21 NOTE — Telephone Encounter (Signed)
New message      Pt had a device battery changeout yesterday.  Today, pt woke up with a cough.  Please advise

## 2017-01-21 NOTE — Telephone Encounter (Signed)
Spoke with pt, pt stated that he was coughing up a small amount of clear/yellow thin fluid every once in a while, pt denied fever or chills and pt stated that device site was not swollen, red. Informed pt to call back if his sputum turned thick/green or if he developed fever or chills. Pt voiced understanding.

## 2017-01-30 ENCOUNTER — Ambulatory Visit (INDEPENDENT_AMBULATORY_CARE_PROVIDER_SITE_OTHER): Payer: Medicare Other | Admitting: *Deleted

## 2017-01-30 DIAGNOSIS — I482 Chronic atrial fibrillation, unspecified: Secondary | ICD-10-CM

## 2017-01-30 DIAGNOSIS — Z950 Presence of cardiac pacemaker: Secondary | ICD-10-CM

## 2017-01-30 DIAGNOSIS — I442 Atrioventricular block, complete: Secondary | ICD-10-CM | POA: Diagnosis not present

## 2017-01-30 DIAGNOSIS — I5023 Acute on chronic systolic (congestive) heart failure: Secondary | ICD-10-CM

## 2017-01-30 LAB — CUP PACEART INCLINIC DEVICE CHECK
Brady Statistic RA Percent Paced: 0 %
Brady Statistic RV Percent Paced: 96 %
Implantable Lead Implant Date: 20060802
Implantable Lead Location: 753858
Implantable Lead Location: 753859
Implantable Lead Location: 753860
Lead Channel Impedance Value: 412.5 Ohm
Lead Channel Impedance Value: 675 Ohm
Lead Channel Pacing Threshold Pulse Width: 0.5 ms
Lead Channel Sensing Intrinsic Amplitude: 9.9 mV
Lead Channel Setting Sensing Sensitivity: 4 mV
MDC IDC LEAD IMPLANT DT: 20060802
MDC IDC LEAD IMPLANT DT: 20060802
MDC IDC MSMT BATTERY REMAINING LONGEVITY: 81 mo
MDC IDC MSMT BATTERY VOLTAGE: 2.96 V
MDC IDC MSMT LEADCHNL LV PACING THRESHOLD AMPLITUDE: 1.5 V
MDC IDC MSMT LEADCHNL LV PACING THRESHOLD PULSEWIDTH: 1 ms
MDC IDC MSMT LEADCHNL RV PACING THRESHOLD AMPLITUDE: 0.75 V
MDC IDC PG IMPLANT DT: 20180917
MDC IDC PG SERIAL: 8900059
MDC IDC SESS DTM: 20180927104619
MDC IDC SET LEADCHNL LV PACING AMPLITUDE: 2.5 V
MDC IDC SET LEADCHNL LV PACING PULSEWIDTH: 1 ms
MDC IDC SET LEADCHNL RV PACING AMPLITUDE: 2.5 V
MDC IDC SET LEADCHNL RV PACING PULSEWIDTH: 0.5 ms

## 2017-01-30 NOTE — Progress Notes (Signed)
Wound check appointment. Steri-strips removed. Wound without redness or edema. Incision edges approximated, wound well healed. Normal device function. Thresholds, sensing, and impedances consistent with implant measurements. Device programmed at chronic outputs. Histogram distribution appropriate for patient and level of activity. No high ventricular rates noted. Patient educated about wound care, arm mobility, lifting restrictions, and Merlin monitor. ROV with GT on 04/22/17

## 2017-01-31 DIAGNOSIS — N184 Chronic kidney disease, stage 4 (severe): Secondary | ICD-10-CM | POA: Diagnosis not present

## 2017-01-31 DIAGNOSIS — I509 Heart failure, unspecified: Secondary | ICD-10-CM | POA: Diagnosis not present

## 2017-01-31 DIAGNOSIS — R600 Localized edema: Secondary | ICD-10-CM | POA: Diagnosis not present

## 2017-01-31 DIAGNOSIS — I10 Essential (primary) hypertension: Secondary | ICD-10-CM | POA: Diagnosis not present

## 2017-01-31 DIAGNOSIS — R634 Abnormal weight loss: Secondary | ICD-10-CM | POA: Diagnosis not present

## 2017-01-31 DIAGNOSIS — Z6828 Body mass index (BMI) 28.0-28.9, adult: Secondary | ICD-10-CM | POA: Diagnosis not present

## 2017-02-07 DIAGNOSIS — R600 Localized edema: Secondary | ICD-10-CM | POA: Diagnosis not present

## 2017-02-07 DIAGNOSIS — J439 Emphysema, unspecified: Secondary | ICD-10-CM | POA: Diagnosis not present

## 2017-02-07 DIAGNOSIS — I509 Heart failure, unspecified: Secondary | ICD-10-CM | POA: Diagnosis not present

## 2017-02-07 DIAGNOSIS — I10 Essential (primary) hypertension: Secondary | ICD-10-CM | POA: Diagnosis not present

## 2017-02-07 DIAGNOSIS — R634 Abnormal weight loss: Secondary | ICD-10-CM | POA: Diagnosis not present

## 2017-02-26 ENCOUNTER — Ambulatory Visit (INDEPENDENT_AMBULATORY_CARE_PROVIDER_SITE_OTHER): Payer: Medicare Other | Admitting: Internal Medicine

## 2017-02-26 ENCOUNTER — Ambulatory Visit (INDEPENDENT_AMBULATORY_CARE_PROVIDER_SITE_OTHER)
Admission: RE | Admit: 2017-02-26 | Discharge: 2017-02-26 | Disposition: A | Discharge status: Home / Self Care | Payer: Medicare Other | Source: Ambulatory Visit | Attending: Internal Medicine | Admitting: Internal Medicine

## 2017-02-26 ENCOUNTER — Other Ambulatory Visit (INDEPENDENT_AMBULATORY_CARE_PROVIDER_SITE_OTHER): Payer: Medicare Other

## 2017-02-26 ENCOUNTER — Encounter: Payer: Self-pay | Admitting: Internal Medicine

## 2017-02-26 VITALS — BP 100/60 | HR 77 | Ht 66.0 in | Wt 170.0 lb

## 2017-02-26 DIAGNOSIS — J9611 Chronic respiratory failure with hypoxia: Secondary | ICD-10-CM

## 2017-02-26 DIAGNOSIS — E875 Hyperkalemia: Secondary | ICD-10-CM

## 2017-02-26 DIAGNOSIS — R0609 Other forms of dyspnea: Secondary | ICD-10-CM

## 2017-02-26 DIAGNOSIS — R0602 Shortness of breath: Secondary | ICD-10-CM | POA: Diagnosis not present

## 2017-02-26 LAB — BASIC METABOLIC PANEL
BUN: 38 mg/dL — ABNORMAL HIGH (ref 6–23)
CALCIUM: 9.9 mg/dL (ref 8.4–10.5)
CO2: 28 meq/L (ref 19–32)
CREATININE: 1.6 mg/dL — AB (ref 0.40–1.50)
Chloride: 104 mEq/L (ref 96–112)
GFR: 43.52 mL/min — ABNORMAL LOW (ref >60.00)
GLUCOSE: 114 mg/dL — AB (ref 70–99)
Potassium: 5.5 mEq/L — ABNORMAL HIGH (ref 3.5–5.1)
Sodium: 138 mEq/L (ref 135–145)

## 2017-02-26 LAB — CBC WITH DIFFERENTIAL/PLATELET
BASOS PCT: 1.3 % (ref 0.0–3.0)
Basophils Absolute: 0.1 10*3/uL (ref 0.0–0.1)
EOS ABS: 0.2 10*3/uL (ref 0.0–0.7)
Eosinophils Relative: 3.6 % (ref 0.0–5.0)
HCT: 36.7 % — ABNORMAL LOW (ref 39.0–52.0)
Hemoglobin: 12.1 g/dL — ABNORMAL LOW (ref 13.0–17.0)
Lymphocytes Relative: 20.7 % (ref 12.0–46.0)
Lymphs Abs: 1 10*3/uL (ref 0.7–4.0)
MCHC: 32.9 g/dL (ref 30.0–36.0)
MCV: 95.7 fl (ref 78.0–100.0)
MONO ABS: 0.4 10*3/uL (ref 0.1–1.0)
Monocytes Relative: 8.1 % (ref 3.0–12.0)
NEUTROS ABS: 3.1 10*3/uL (ref 1.4–7.7)
Neutrophils Relative %: 66.3 % (ref 43.0–77.0)
PLATELETS: 148 10*3/uL — AB (ref 150.0–400.0)
RBC: 3.84 Mil/uL — ABNORMAL LOW (ref 4.22–5.81)
RDW: 15.6 % — AB (ref 11.5–15.5)
WBC: 4.7 10*3/uL (ref 4.0–10.5)

## 2017-02-26 LAB — TSH: TSH: 2.89 u[IU]/mL (ref 0.35–4.50)

## 2017-02-26 LAB — SEDIMENTATION RATE: SED RATE: 9 mm/h (ref 0–20)

## 2017-02-26 LAB — BRAIN NATRIURETIC PEPTIDE: Pro B Natriuretic peptide (BNP): 830 pg/mL — ABNORMAL HIGH (ref 0.0–100.0)

## 2017-02-26 NOTE — Patient Instructions (Addendum)
When legs are as swollen as they are now, go ahead and take one extra lasix per day as needed (not automatically like you do the other dose of lasix)  Please remember to go to the lab and x-ray department downstairs in the basement  for your tests - we will call you with the results when they are available.  Goal for your oxygen level is to maintain at 90% or greater at all times to help your heart and kidneys work better   Pulmonary follow up is as needed  Late add: K 5.5 so d/c kdur

## 2017-02-26 NOTE — Progress Notes (Signed)
Subjective:    Patient ID: Arthur Armstrong, male    DOB: [birth year], 83 y.o.   MRN: [id number]      Brief patient profile:  89 yowm minimal smoking hx stopped at age 25 eval by Dr Gwenette Greet in 2013 for restrictive changes on pfts p admit for chf  With 02 dep ever since @ 2lpm at bedtime and prn with activity (but non adherence documented)  And  since Jan 2018 admit with chf > snf  2lpm 24/7 but even on 2lpm continuous flow continues to struggle after 50 ft so referred to pulmonary clinic 02/26/2017 by Dr   Odette Fraction s/p admit   Admit date: 12/30/2016 Discharge date: 01/02/2017  Admitted From: Home.  Disposition:  Home.   Recommendations for Outpatient Follow-up:  1. Follow up with PCP in 1-2 weeks 2. Please obtain BMP/CBC in one week  Discharge Condition:stable.  CODE STATUS: full code.  Diet recommendation: Heart Healthy   Brief/Interim Summary: Arthur Armstrong an 83 y.o.malewith a past medical history of class II chronic systolic CHF, EF 54-62 percent by 2-D echo 05/20/16, status post biventricular ICD implantation, nonischemic cardiomyopathy, atrial fibrillation on Eliquis, hyperlipidemia, hypertension, history of mitral valve repair, adrenal mass who presented to his PCPs office on 12/27/16 for evaluation of a one-month history of weakness, loss of energy, loss of appetite and weight loss.He was found to be hypotensive and in acute renal failure. He was admitted for further evaluation.   Discharge Diagnoses:  Principal Problem:   Hypotension Active Problems:   Atrial fibrillation ([facility])   Automatic implantable cardioverter-defibrillator in situ   Dyslipidemia   Chronic systolic CHF (congestive heart failure) ([facility])   Nonischemic cardiomyopathy ([facility])   Acute renal failure superimposed on stage 3 chronic kidney disease ([facility])   Generalized weakness   Weight loss  Hypotension. Much improved. Suspect its from dehydration and anti hypertensive's.  MAP>65.  Holding all bp meds  at this time, asymptomatic at this time.   Restarted lasix at a lower dose on discharge and recommended to check bp everyday.    Atrial fibrillation:  Chronic, rate controlled and on eliquis for anti coagulation.    S/p AICD:  Stable.    Hyperlipidemia:  Recommend outpatient work up with a lipid panel.    Chronic systolic heart failure:  He appears euvolemic.  On 2l it of [place] oxygen,.   Acute kidney injury secondary to hypoperfusion from hypotension:  Improved and almost back to baseline.  Gentle hydration and repeat renal parameters in am show much improvement.    02/26/2017 1st [facility] office visit/ Arthur Armstrong   Chief Complaint  Patient presents with  . Pulmonary Consult    Referred by Dr. Osborne Casco. Pt c/o increased SOB since Aug 2018.  He states he is getting SOB with just walking to the mailbox.  He also c/o non prod cough.   lives 50 ft from MB with slt incline to house and stops half way back and could do this on 2lpm pulse in July  s stopping  But can't do since admit with "dehydration" in Aug 2018 and lets his fm do all his grocery shopping now Sleeps fine flat one large pillow  Does not check his own sats with walking / has never received chemo or amiodarone per his recollection and med search in epic did not reveal any concerning meds/ no h/o connective tissue dz    No obvious day to day or daytime variability or assoc excess/ purulent sputum or  mucus plugs or hemoptysis or cp or chest tightness, subjective wheeze or overt sinus or hb symptoms. No unusual exp hx or h/o childhood pna/ asthma or knowledge of premature birth.  Sleeping ok flat without nocturnal  or early am exacerbation  of respiratory  c/o's or need for noct saba. Also denies any obvious fluctuation of symptoms with weather or environmental changes or other aggravating or alleviating factors except as outlined above   Current Allergies, Complete Past Medical History, Past Surgical History,  Family History, and Social History were reviewed in [facility] record.  ROS  The following are not active complaints unless bolded Hoarseness, sore throat, dysphagia, dental problems, itching, sneezing,  nasal congestion or discharge of excess mucus or purulent secretions, ear ache,   fever, chills, sweats, unintended wt loss or wt gain, classically pleuritic or exertional cp,  orthopnea pnd or leg swelling, presyncope, palpitations, abdominal pain, anorexia, nausea, vomiting, diarrhea  or change in bowel habits or change in bladder habits, change in stools or change in urine, dysuria, hematuria,  rash, arthralgias, visual complaints, headache, numbness, weakness or ataxia or problems with walking or coordination,  change in mood/affect or memory.        Current Meds  Medication Sig  . acetaminophen (TYLENOL) 500 MG tablet Take 500 mg by mouth every 6 (six) hours as needed for mild pain.   . carvedilol (COREG) 6.25 MG tablet Take 3.125 mg by mouth 2 (two) times daily with a meal.  . ELIQUIS 2.5 MG TABS tablet TAKE 1 TABLET BY MOUTH TWICE DAILY  . feeding supplement, ENSURE ENLIVE, (ENSURE ENLIVE) LIQD Take 237 mLs by mouth 3 (three) times daily between meals.  . furosemide (LASIX) 40 MG tablet Take 40 mg by mouth daily.  . OXYGEN 2lpm 24/7  DME- [facility]  . Polyethyl Glycol-Propyl Glycol (SYSTANE OP) Place 1 drop into both eyes daily.  . potassium chloride SA (K-DUR,KLOR-CON) 20 MEQ tablet Take 20 mEq by mouth daily.  . Tamsulosin HCl (FLOMAX) 0.4 MG CAPS Take 0.4 mg by mouth at bedtime.               Objective:   Physical Exam   Pleasant amb chronically ill appearing elderly wm nad  Wt Readings from Last 3 Encounters:  02/26/17 170 lb (77.1 kg)  01/20/17 167 lb 12.8 oz (76.1 kg)  01/09/17 138 lb (62.6 kg)    Vital signs reviewed - Note on arrival 02 sats  96% on 2lpm  continuous   HEENT: nl dentition, turbinates bilaterally, and oropharynx. Nl external ear  canals without cough reflex   NECK :  without JVD/Nodes/TM/ nl carotid upstrokes bilaterally   LUNGS: no acc muscle use,  Nl contour chest with crackles in both bases on insp but no assoc cough    CV:  RRR  no s3 or murmur or increase in P2, and  1-2+ pitting sym both lower ext edema wearing elastic hose   ABD:  soft and nontender with nl inspiratory excursion in the supine position. No bruits or organomegaly appreciated, bowel sounds nl  MS:  Nl gait/ ext warm without deformities, calf tenderness, cyanosis or clubbing No obvious joint restrictions   SKIN: warm and dry without lesions    NEURO:  alert, approp, nl sensorium with  no motor or cerebellar deficits apparent.     CXR PA and Lateral:   02/26/2017 :    I personally reviewed images and agree with radiology impression as follows:  Cardiomegaly, vascular congestion.  Chronic scarring in the lung bases.  No acute findings.   Labs ordered/ reviewed:      Chemistry      Component Value Date/Time   NA 138 02/26/2017 1631   NA 138 01/09/2017 1055   K 5.5 (H) 02/26/2017 1631   CL 104 02/26/2017 1631   CO2 28 02/26/2017 1631   BUN 38 (H) 02/26/2017 1631   BUN 37 (H) 01/09/2017 1055   CREATININE 1.60 (H) 02/26/2017 1631      Component Value Date/Time   CALCIUM 9.9 02/26/2017 1631   ALKPHOS 78 12/30/2016 1256   AST 16 12/30/2016 1256   ALT 11 (L) 12/30/2016 1256   BILITOT 1.0 12/30/2016 1256        Lab Results  Component Value Date   WBC 4.7 02/26/2017   HGB 12.1 (L) 02/26/2017   HCT 36.7 (L) 02/26/2017   MCV 95.7 02/26/2017   PLT 148.0 (L) 02/26/2017         Lab Results  Component Value Date   TSH 2.89 02/26/2017     Lab Results  Component Value Date   PROBNP 830.0 (H) 02/26/2017       Lab Results  Component Value Date   ESRSEDRATE 9 02/26/2017               Assessment & Plan:

## 2017-02-27 DIAGNOSIS — E875 Hyperkalemia: Secondary | ICD-10-CM | POA: Insufficient documentation

## 2017-02-27 NOTE — Assessment & Plan Note (Signed)
02/26/2017   Walked 3lpm  2 laps @ 185 ft each stopped due to  desat to 87 at end of 2nd lap at moderate pace assoc with sob   As of 02/26/2017 rec 2lpm 24/7,  3lpm Pulsed with room to room walking but anything more than that use 4lpm pulsed or purchase oximeter and self titrate with ex   Explained critical for heart and lung function that he use adequate 02 to keep sats > 90% at all times.

## 2017-02-27 NOTE — Assessment & Plan Note (Signed)
Lab Results  Component Value Date   CREATININE 1.60 (H) 02/26/2017   CREATININE 1.43 (H) 01/09/2017   CREATININE 1.47 (H) 01/02/2017     Given cri probably best to d/c the Kdur altogether and Follow up per Primary Care planned

## 2017-02-27 NOTE — Assessment & Plan Note (Addendum)
Echo 05/20/16 - Left ventricle: Inferior, inferolateral, inferoseptal   hypokinesis. The cavity size was normal. Wall thickness was   increased in a pattern of mild LVH. Systolic function was mildly   to moderately reduced. The estimated ejection fraction was in the   range of 40% to 45%. - Mitral valve: s/p MV repair. Calcified annulus. Mildly thickened   leaflets . There was mild regurgitation. - Left atrium: The atrium was mildly dilated. - Right ventricle: The cavity size was moderately dilated. Wall   thickness was normal. Systolic function was mildly to moderately   reduced. - Right atrium: The atrium was severely dilated. - Pulmonary arteries: PA peak pressure: 66 mm Hg (S).   This appears multifactorial but mostly related to chronic chf with ? Element of PAH now (would require RHC to confirm and probably would not change rx which = adequate 02 24/7 and rx with eliquis in setting of AF/chf to prophylax against dvt/PE.  Can't rule out component of primary ILD but again the main rx will be keep as dry as tol and give adequate 02 given advance age and general geriatric decline.    rec more aggressive diuresis when leg swelling is significant > add extra lasix one daily in this setting prn (rather than double the maint dose for regular use  which resulted in apparent admit)   Total time devoted to counseling  > 50 % of initial 60 min office visit:  review case with pt/daughter including extensive cardiology notes and admit records -  discussion of options/alternatives/ personally creating written customized instructions  in presence of pt  then going over those specific  Instructions directly with the pt including how to use all of the meds but in particular covering each new medication in detail and the difference between the maintenance= "automatic" meds and the prns using an action plan format for the latter (If this problem/symptom => do that organization reading Left to right).  Please  see AVS from this visit for a full list of these instructions which I personally wrote for this pt and  are unique to this visit.

## 2017-02-28 NOTE — Progress Notes (Signed)
Spoke with pt's daughter and notified of results per Dr. Melvyn Novas. She verbalized understanding and denied any questions. Will inform the pt.

## 2017-02-28 NOTE — Progress Notes (Signed)
Spoke with pt's daughter and notified of results per Dr. Wert. Pt verbalized understanding and denied any questions. 

## 2017-03-06 DIAGNOSIS — Z23 Encounter for immunization: Secondary | ICD-10-CM | POA: Diagnosis not present

## 2017-03-18 DIAGNOSIS — I509 Heart failure, unspecified: Secondary | ICD-10-CM | POA: Diagnosis not present

## 2017-03-18 DIAGNOSIS — Z6828 Body mass index (BMI) 28.0-28.9, adult: Secondary | ICD-10-CM | POA: Diagnosis not present

## 2017-03-18 DIAGNOSIS — I420 Dilated cardiomyopathy: Secondary | ICD-10-CM | POA: Diagnosis not present

## 2017-03-18 DIAGNOSIS — I10 Essential (primary) hypertension: Secondary | ICD-10-CM | POA: Diagnosis not present

## 2017-03-18 DIAGNOSIS — J439 Emphysema, unspecified: Secondary | ICD-10-CM | POA: Diagnosis not present

## 2017-03-18 DIAGNOSIS — Z9981 Dependence on supplemental oxygen: Secondary | ICD-10-CM | POA: Diagnosis not present

## 2017-03-18 DIAGNOSIS — E1129 Type 2 diabetes mellitus with other diabetic kidney complication: Secondary | ICD-10-CM | POA: Diagnosis not present

## 2017-03-18 DIAGNOSIS — I480 Paroxysmal atrial fibrillation: Secondary | ICD-10-CM | POA: Diagnosis not present

## 2017-03-18 DIAGNOSIS — E875 Hyperkalemia: Secondary | ICD-10-CM | POA: Diagnosis not present

## 2017-03-18 DIAGNOSIS — I959 Hypotension, unspecified: Secondary | ICD-10-CM | POA: Diagnosis not present

## 2017-04-22 ENCOUNTER — Ambulatory Visit (INDEPENDENT_AMBULATORY_CARE_PROVIDER_SITE_OTHER): Payer: Medicare Other | Admitting: Internal Medicine

## 2017-04-22 ENCOUNTER — Encounter: Payer: Self-pay | Admitting: Internal Medicine

## 2017-04-22 VITALS — BP 90/78 | HR 65 | Ht 66.0 in | Wt 171.0 lb

## 2017-04-22 DIAGNOSIS — Z950 Presence of cardiac pacemaker: Secondary | ICD-10-CM

## 2017-04-22 DIAGNOSIS — I482 Chronic atrial fibrillation, unspecified: Secondary | ICD-10-CM

## 2017-04-22 DIAGNOSIS — I442 Atrioventricular block, complete: Secondary | ICD-10-CM

## 2017-04-22 DIAGNOSIS — I5022 Chronic systolic (congestive) heart failure: Secondary | ICD-10-CM

## 2017-04-22 MED ORDER — FUROSEMIDE 40 MG PO TABS: 80.0000 mg | ORAL_TABLET | Freq: Every day | ORAL | 3 refills | Status: DC

## 2017-04-22 NOTE — Progress Notes (Signed)
HPI [name] returns today for ongoing evaluation and management of his biventricular device.  He underwent biventricular ICD removal and insertion of a biventricular pacemaker 3 months ago.  In the interim, he has had problems with severe fatigue and weakness, dizziness, shortness of breath, and malaise.  In the office today his oxygen level dropped into the low 80s. No Known Allergies   Current Outpatient Medications  Medication Sig Dispense Refill  . acetaminophen (TYLENOL) 500 MG tablet Take 500 mg by mouth every 6 (six) hours as needed for mild pain.     Marland Kitchen ELIQUIS 2.5 MG TABS tablet TAKE 1 TABLET BY MOUTH TWICE DAILY 60 tablet 6  . feeding supplement, ENSURE ENLIVE, (ENSURE ENLIVE) LIQD Take 237 mLs by mouth 3 (three) times daily between meals. 237 mL 12  . furosemide (LASIX) 40 MG tablet Take 40 mg by mouth daily.    . OXYGEN 2lpm 24/7  DME- [facility]    . Polyethyl Glycol-Propyl Glycol (SYSTANE OP) Place 1 drop into both eyes daily.    . Tamsulosin HCl (FLOMAX) 0.4 MG CAPS Take 0.4 mg by mouth at bedtime.       No current facility-administered medications for this visit.      Past Medical History:  Diagnosis Date  . Atrial fibrillation ([place])   . Automatic implantable cardiac defibrillator in situ    BiV pacer  . Chronic systolic CHF (congestive heart failure) ([place])   . Coronary artery disease, non-occlusive    cath 2006 pLM 20%, pLAD 40%; mCFX 20%  . Hernia, ventral   . HLD (hyperlipidemia)   . HTN (hypertension)   . ICD (implantable cardiac defibrillator) in place    BIV ICD  . NICM (nonischemic cardiomyopathy) ([place])    nicm ef 30-35%,nonob cad-cath 2006,echo 2/12:ef30-35%.mod lvh,inf hk,mild-mod lae and mild rae,pasp 35  . Pacemaker    BIV ICD  . PFO (patent foramen ovale)    s/p repair 2006  . S/P mitral valve repair    2006  . Shortness of breath   . Systolic CHF ([place])   . Unspecified essential hypertension     ROS:   All systems reviewed and negative  except as noted in the HPI.   Past Surgical History:  Procedure Laterality Date  . BILATERAL KNEE ARTHROSCOPY    . BIV PACEMAKER GENERATOR CHANGEOUT N/A 01/20/2017   Procedure: BIV PACEMAKER GENERATOR CHANGEOUT;  Surgeon: Evans Lance, MD;  Location: [facility];  Service: Cardiovascular;  Laterality: N/A;  . COLONOSCOPY WITH POLYPECTOMY    . HIP PINNING  3/2/012  . IMPLANTATION OF A DUAL CHAMBER BIVENTRICULAR ICD    . INTRAOPERATIVE TRANSESOPHAGEAL ECHOCARDIOGRAM    . MEDIAN STEROTOMY FOR MITRAL VALVE REPAIR    . RT. SHOULDER       Family History  Problem Relation Age of Onset  . Heart attack Mother   . Heart attack Father   . Heart disease Neg Hx        Rheumatic or valvular heart disease   . Cardiomyopathy Neg Hx      Social History   Socioeconomic History  . Marital status: Married    Spouse name: Not on file  . Number of children: 6  . Years of education: Not on file  . Highest education level: Not on file  Social Needs  . Financial resource strain: Not on file  . Food insecurity - worry: Not on file  . Food insecurity - inability:  Not on file  . Transportation needs - medical: Not on file  . Transportation needs - non-medical: Not on file  Occupational History  . 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 Occupation: [employer] in his church  Tobacco Use  . Smoking status: Former Smoker    Packs/day: 1.00    Years: 2.00    Pack years: 2.00    Last attempt to quit: 05/06/1948    Years since quitting: 69.0  . Smokeless tobacco: Former Systems developer    Quit date: 05/07/1951  Substance and Sexual Activity  . Alcohol use: No  . Drug use: No  . Sexual activity: Not Currently  Other Topics Concern  . Not on file  Social History Narrative   Lives with his wife in [place].   Has 6 grown children.   Remains quite active.     BP 90/78   Pulse 65   Ht 5\' 6"  (1.676 m)   Wt 171 lb (77.6 kg)   SpO2 96%   BMI  27.60 kg/m   Physical Exam:   elderly appearing 83 year old man, NAD HEENT: Unremarkable Neck: 8 cm JVD, no thyromegally Lymphatics:  No adenopathy Back:  No CVA tenderness Lungs:  Clear, except for basilar rales bilaterally.  Well-healed pacemaker incision HEART:  Regular rate rhythm, no murmurs, no rubs, no clicks Abd:  soft, positive bowel sounds, no organomegally, no rebound, no guarding Ext:  2 plus pulses, no edema, no cyanosis, no clubbing Skin:  No rashes no nodules Neuro:  CN II through XII intact, motor grossly intact  EKG -atrial fibrillation with biventricular pacing and PVCs  DEVICE  Normal device function.  See PaceArt for details.   Assess/Plan: 1.  Chronic systolic heart failure -the patient has worsening symptoms.  I have asked the patient to increase his Lasix from 40 mg a day to 80 mg a day.  We will check labs in a week. 2.  Hypoxemia -the patient is on chronic oxygen therapy.  He desaturates to the low 80s on 2 L.  Previous review of pulmonary function testing demonstrated a marketed reduction in the DLCO.  5 years ago his DLCO was 50%.  The etiology is unclear in my mind as to what is causing this as he was not thought to have evidence of either restriction or obstruction with his pulmonary function testing.  I defer any additional treatment of his reduced DLCO to Dr. Clois Comber his pulmonologist.  3.  Atrial fibrillation -the patient's ventricular rate is well controlled.  No change in medical therapy 4.  Pacemaker -he is status post biventricular pacemaker insertion after his biventricular ICD had reached elective replacement.  His device is working normally.  Crissie Sickles, MD

## 2017-04-22 NOTE — Patient Instructions (Addendum)
Medication Instructions:  Your physician has recommended you make the following change in your medication:  1.  Stop taking carvedilol (Coreg). 2.  Increase your furosemide to 2 tablets daily.  Labwork: Return to [facility] office in 7 to 10 days for a BMP.  Testing/Procedures: None ordered.  Follow-Up: Your physician wants you to follow-up in: 3 months with Dr. Lovena Le.  Remote monitoring is used to monitor your Pacemaker from home. This monitoring reduces the number of office visits required to check your device to one time per year. It allows [place] to keep an eye on the functioning of your device to ensure it is working properly. You are scheduled for a device check from home on 07/22/2017. You may send your transmission at any time that day. If you have a wireless device, the transmission will be sent automatically. After your physician reviews your transmission, you will receive a postcard with your next transmission date.   Any Other Special Instructions Will Be Listed Below (If Applicable).  If you need a refill on your cardiac medications before your next appointment, please call your pharmacy.

## 2017-04-23 ENCOUNTER — Telehealth: Payer: Self-pay | Admitting: Internal Medicine

## 2017-04-23 LAB — CUP PACEART INCLINIC DEVICE CHECK
Battery Remaining Longevity: 78 mo
Battery Voltage: 2.99 V
Implantable Lead Implant Date: 20060802
Implantable Lead Location: 753860
Implantable Lead Model: 7001
Lead Channel Impedance Value: 400 Ohm
Lead Channel Pacing Threshold Amplitude: 0.5 V
Lead Channel Pacing Threshold Amplitude: 1.5 V
Lead Channel Pacing Threshold Pulse Width: 0.5 ms
Lead Channel Pacing Threshold Pulse Width: 1 ms
Lead Channel Sensing Intrinsic Amplitude: 12 mV
MDC IDC LEAD IMPLANT DT: 20060802
MDC IDC LEAD IMPLANT DT: 20060802
MDC IDC LEAD LOCATION: 753858
MDC IDC LEAD LOCATION: 753859
MDC IDC MSMT LEADCHNL LV IMPEDANCE VALUE: 625 Ohm
MDC IDC MSMT LEADCHNL LV PACING THRESHOLD AMPLITUDE: 1.5 V
MDC IDC MSMT LEADCHNL LV PACING THRESHOLD PULSEWIDTH: 1 ms
MDC IDC MSMT LEADCHNL RV PACING THRESHOLD AMPLITUDE: 0.5 V
MDC IDC MSMT LEADCHNL RV PACING THRESHOLD PULSEWIDTH: 0.5 ms
MDC IDC PG IMPLANT DT: 20180917
MDC IDC PG SERIAL: 8900059
MDC IDC SESS DTM: 20181218195535
MDC IDC SET LEADCHNL LV PACING AMPLITUDE: 2.5 V
MDC IDC SET LEADCHNL LV PACING PULSEWIDTH: 1 ms
MDC IDC SET LEADCHNL RV PACING AMPLITUDE: 2.5 V
MDC IDC SET LEADCHNL RV PACING PULSEWIDTH: 0.5 ms
MDC IDC SET LEADCHNL RV SENSING SENSITIVITY: 4 mV
MDC IDC STAT BRADY RA PERCENT PACED: 0 %
MDC IDC STAT BRADY RV PERCENT PACED: 95 %

## 2017-04-23 NOTE — Telephone Encounter (Signed)
Arthur Armstrong is not on pt's dpr and no callback number was listed. Called primary number listed on patient's account and daughter Arthur Armstrong (dpr on file) answered.  Arthur Armstrong states she has no idea why phone call was made as pt cannot go without his continuous flow O2.  I advised Arthur Armstrong that I did not have permission to speak to Arthur Armstrong, and advised that she communicate with Arthur Armstrong to see if this is something that pt wishes to proceed with, and if so that we would need to schedule and OV and walk to qualify for machine.  Arthur Armstrong expressed understanding.  Nothing further needed.

## 2017-05-02 ENCOUNTER — Other Ambulatory Visit: Payer: Medicare Other | Admitting: *Deleted

## 2017-05-02 DIAGNOSIS — I5022 Chronic systolic (congestive) heart failure: Secondary | ICD-10-CM

## 2017-05-02 DIAGNOSIS — I442 Atrioventricular block, complete: Secondary | ICD-10-CM | POA: Diagnosis not present

## 2017-05-02 DIAGNOSIS — Z950 Presence of cardiac pacemaker: Secondary | ICD-10-CM

## 2017-05-02 DIAGNOSIS — I482 Chronic atrial fibrillation, unspecified: Secondary | ICD-10-CM

## 2017-05-03 LAB — BASIC METABOLIC PANEL
BUN/Creatinine Ratio: 19 (ref 10–24)
BUN: 36 mg/dL — ABNORMAL HIGH (ref 8–27)
CALCIUM: 9 mg/dL (ref 8.6–10.2)
CO2: 24 mmol/L (ref 20–29)
CREATININE: 1.89 mg/dL — AB (ref 0.76–1.27)
Chloride: 100 mmol/L (ref 96–106)
GFR, EST AFRICAN AMERICAN: 36 mL/min/{1.73_m2} — AB (ref >59)
GFR, EST NON AFRICAN AMERICAN: 31 mL/min/{1.73_m2} — AB (ref >59)
Glucose: 118 mg/dL — ABNORMAL HIGH (ref 65–99)
Potassium: 4.5 mmol/L (ref 3.5–5.2)
Sodium: 140 mmol/L (ref 134–144)

## 2017-05-09 ENCOUNTER — Telehealth: Payer: Self-pay | Admitting: Internal Medicine

## 2017-05-09 MED ORDER — FUROSEMIDE 40 MG PO TABS: 60.0000 mg | ORAL_TABLET | Freq: Every day | ORAL | Status: AC

## 2017-05-09 NOTE — Telephone Encounter (Signed)
New message     Patient daughter calling to get lab results, also has question about Lasix dosage. Please call

## 2017-05-09 NOTE — Telephone Encounter (Signed)
-----   Message from Evans Lance, MD sent at 05/07/2017  8:32 PM EST ----- Reduce lasix to 60 mg daily. GT

## 2017-05-09 NOTE — Telephone Encounter (Signed)
DPR on file for pt's daughter Novella Rob. Pt's daughter has been made aware of lab results and recommendations to decrease lasix to 60 mg daily. Pt's daughter read back instructions with verbal understanding. Freda Munro thanked me for my call and my help, she also confirmed appts 07/22/17 with Dr. Lovena Le and remote check.

## 2017-07-09 ENCOUNTER — Other Ambulatory Visit: Payer: Self-pay | Admitting: Internal Medicine

## 2017-07-09 NOTE — Telephone Encounter (Signed)
Age 84 years Wt 77.6kg 04/22/2017 Saw Dr Lovena Le 04/22/2017 05/02/2017 SrCr 1.89 02/26/2017 Hgb 12.1 HCT 36.7  Refill done for Eliquis 2.5mg  every 12 hours as requested

## 2017-07-10 ENCOUNTER — Other Ambulatory Visit: Payer: Self-pay | Admitting: *Deleted

## 2017-07-22 ENCOUNTER — Ambulatory Visit (INDEPENDENT_AMBULATORY_CARE_PROVIDER_SITE_OTHER): Payer: Medicare Other | Admitting: Internal Medicine

## 2017-07-22 ENCOUNTER — Ambulatory Visit (INDEPENDENT_AMBULATORY_CARE_PROVIDER_SITE_OTHER): Payer: Medicare Other | Admitting: *Deleted

## 2017-07-22 ENCOUNTER — Encounter: Payer: Self-pay | Admitting: Internal Medicine

## 2017-07-22 VITALS — BP 110/60 | HR 56 | Ht 66.0 in | Wt 164.2 lb

## 2017-07-22 DIAGNOSIS — Z950 Presence of cardiac pacemaker: Secondary | ICD-10-CM

## 2017-07-22 DIAGNOSIS — I482 Chronic atrial fibrillation, unspecified: Secondary | ICD-10-CM

## 2017-07-22 DIAGNOSIS — I428 Other cardiomyopathies: Secondary | ICD-10-CM | POA: Diagnosis not present

## 2017-07-22 DIAGNOSIS — I442 Atrioventricular block, complete: Secondary | ICD-10-CM | POA: Diagnosis not present

## 2017-07-22 LAB — CUP PACEART INCLINIC DEVICE CHECK
Battery Remaining Longevity: 75 mo
Date Time Interrogation Session: 20190319152338
Implantable Lead Implant Date: 20060802
Implantable Lead Implant Date: 20060802
Implantable Lead Implant Date: 20060802
Implantable Lead Location: 753859
Implantable Pulse Generator Implant Date: 20180917
Lead Channel Impedance Value: 412.5 Ohm
Lead Channel Pacing Threshold Amplitude: 0.75 V
Lead Channel Pacing Threshold Amplitude: 1 V
Lead Channel Pacing Threshold Pulse Width: 1 ms
Lead Channel Pacing Threshold Pulse Width: 1 ms
Lead Channel Sensing Intrinsic Amplitude: 8.4 mV
Lead Channel Setting Pacing Amplitude: 2.5 V
MDC IDC LEAD LOCATION: 753858
MDC IDC LEAD LOCATION: 753860
MDC IDC MSMT BATTERY VOLTAGE: 2.99 V
MDC IDC MSMT LEADCHNL LV IMPEDANCE VALUE: 525 Ohm
MDC IDC MSMT LEADCHNL LV PACING THRESHOLD AMPLITUDE: 1 V
MDC IDC MSMT LEADCHNL RV PACING THRESHOLD AMPLITUDE: 0.75 V
MDC IDC MSMT LEADCHNL RV PACING THRESHOLD PULSEWIDTH: 0.5 ms
MDC IDC MSMT LEADCHNL RV PACING THRESHOLD PULSEWIDTH: 0.5 ms
MDC IDC SET LEADCHNL LV PACING PULSEWIDTH: 1 ms
MDC IDC SET LEADCHNL RV PACING AMPLITUDE: 2.5 V
MDC IDC SET LEADCHNL RV PACING PULSEWIDTH: 0.5 ms
MDC IDC SET LEADCHNL RV SENSING SENSITIVITY: 4 mV
MDC IDC STAT BRADY RA PERCENT PACED: 0 %
MDC IDC STAT BRADY RV PERCENT PACED: 88 %
Pulse Gen Model: 3222
Pulse Gen Serial Number: 8900059

## 2017-07-22 NOTE — Patient Instructions (Addendum)
Medication Instructions:  Your physician recommends that you continue on your current medications as directed. Please refer to the Current Medication list given to you today.  Labwork: None ordered.  Testing/Procedures: None ordered.  Follow-Up: Your physician wants you to follow-up in: 6 months with Dr. Lovena Le.   You will receive a reminder letter in the mail two months in advance. If you don't receive a letter, please call our office to schedule the follow-up appointment.  Remote monitoring is used to monitor your Pacemaker from home. This monitoring reduces the number of office visits required to check your device to one time per year. It allows [place] to keep an eye on the functioning of your device to ensure it is working properly. You are scheduled for a device check from home on 10/18/2017. You may send your transmission at any time that day. If you have a wireless device, the transmission will be sent automatically. After your physician reviews your transmission, you will receive a postcard with your next transmission date.  Any Other Special Instructions Will Be Listed Below (If Applicable).  If you need a refill on your cardiac medications before your next appointment, please call your pharmacy.

## 2017-07-22 NOTE — Progress Notes (Signed)
HPI [name] returns today for followup. He is an elderly man with multipel cardiac problems including chronic systolic heart failure and CHB, s/p BiV ICD insertion with downgrade to a Biv PPM. He has developed worsening sob and was found to have evidence of pulmonary fibrosis on CT scan. In addition there appears to be a mass either in the tail of the pancreas or adjacent to the kidney. He is on 4 liters of oxygen. His treatment options are limited.  No Known Allergies   Current Outpatient Medications  Medication Sig Dispense Refill  . acetaminophen (TYLENOL) 500 MG tablet Take 500 mg by mouth every 6 (six) hours as needed for mild pain.     Marland Kitchen ELIQUIS 2.5 MG TABS tablet TAKE 1 TABLET BY MOUTH TWICE DAILY 60 tablet 5  . feeding supplement, ENSURE ENLIVE, (ENSURE ENLIVE) LIQD Take 237 mLs by mouth 3 (three) times daily between meals. 237 mL 12  . furosemide (LASIX) 40 MG tablet Take 1.5 tablets (60 mg total) by mouth daily.    . OXYGEN 2lpm 24/7  DME- [facility]    . Polyethyl Glycol-Propyl Glycol (SYSTANE OP) Place 1 drop into both eyes daily.    . Tamsulosin HCl (FLOMAX) 0.4 MG CAPS Take 0.4 mg by mouth at bedtime.       No current facility-administered medications for this visit.      Past Medical History:  Diagnosis Date  . Atrial fibrillation ([place])   . Automatic implantable cardiac defibrillator in situ    BiV pacer  . Chronic systolic CHF (congestive heart failure) ([place])   . Coronary artery disease, non-occlusive    cath 2006 pLM 20%, pLAD 40%; mCFX 20%  . Hernia, ventral   . HLD (hyperlipidemia)   . HTN (hypertension)   . ICD (implantable cardiac defibrillator) in place    BIV ICD  . NICM (nonischemic cardiomyopathy) ([place])    nicm ef 30-35%,nonob cad-cath 2006,echo 2/12:ef30-35%.mod lvh,inf hk,mild-mod lae and mild rae,pasp 35  . Pacemaker    BIV ICD  . PFO (patent foramen ovale)    s/p repair 2006  . S/P mitral valve repair    2006  . Shortness of breath   .  Systolic CHF ([place])   . Unspecified essential hypertension     ROS:   All systems reviewed and negative except as noted in the HPI.   Past Surgical History:  Procedure Laterality Date  . BILATERAL KNEE ARTHROSCOPY    . BIV PACEMAKER GENERATOR CHANGEOUT N/A 01/20/2017   Procedure: BIV PACEMAKER GENERATOR CHANGEOUT;  Surgeon: Evans Lance, MD;  Location: [facility];  Service: Cardiovascular;  Laterality: N/A;  . COLONOSCOPY WITH POLYPECTOMY    . HIP PINNING  3/2/012  . IMPLANTATION OF A DUAL CHAMBER BIVENTRICULAR ICD    . INTRAOPERATIVE TRANSESOPHAGEAL ECHOCARDIOGRAM    . MEDIAN STEROTOMY FOR MITRAL VALVE REPAIR    . RT. SHOULDER       Family History  Problem Relation Age of Onset  . Heart attack Mother   . Heart attack Father   . Heart disease Neg Hx        Rheumatic or valvular heart disease   . Cardiomyopathy Neg Hx      Social History   Socioeconomic History  . Marital status: Married    Spouse name: Not on file  . Number of children: 6  . Years of education: Not on file  . Highest education level: Not on file  Social Needs  . Financial resource strain: Not on file  . Food insecurity - worry: Not on file  . Food insecurity - inability: Not on file  . Transportation needs - medical: Not on file  . Transportation needs - non-medical: Not on file  Occupational History  . 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 Smoking status: Former Smoker    Packs/day: 1.00    Years: 2.00    Pack years: 2.00    Last attempt to quit: 05/06/1948    Years since quitting: 69.2  . Smokeless tobacco: Former Systems developer    Quit date: 05/07/1951  Substance and Sexual Activity  . Alcohol use: No  . Drug use: No  . Sexual activity: Not Currently  Other Topics Concern  . Not on file  Social History Narrative   Lives with his wife in [place].   Has 6 grown children.   Remains  quite active.     BP 110/60   Pulse (!) 56   Ht 5\' 6"  (1.676 m)   Wt 164 lb 3.2 oz (74.5 kg)   SpO2 90%   BMI 26.50 kg/m   Physical Exam:  Chronically ill appearing 84 yo man,  NAD HEENT: Unremarkable Neck:  7 cm JVD, no thyromegally Lymphatics:  No adenopathy Back:  No CVA tenderness Lungs:  Clear with no wheezes HEART:  Regular rate rhythm, no murmurs, no rubs, no clicks Abd:  soft, positive bowel sounds, no organomegally, no rebound, no guarding Ext:  2 plus pulses, no edema, no cyanosis, no clubbing Skin:  No rashes no nodules Neuro:  CN II through XII intact, motor grossly intact  DEVICE  Normal device function.  See PaceArt for details.   Assess/Plan: 1. CHB - he is stable, s/p PPM insertion 2. Atrial fib - his ventricular rates are well controlled. Will follow. 3. PPM - his [employer] BiV PM is working normally. 4. Pulmonary fibrosis - he requires 4 liters. I do not think he is a candidate for much of anything with regard to anti-fibrotic therapies. Hospice might be a consideration soon.  Arthur Armstrong.D.

## 2017-07-22 NOTE — Progress Notes (Signed)
Remote pacemaker transmission.   

## 2017-07-23 ENCOUNTER — Encounter: Payer: Self-pay | Admitting: Cardiology

## 2017-07-25 LAB — CUP PACEART REMOTE DEVICE CHECK
Battery Remaining Longevity: 80 mo
Date Time Interrogation Session: 20190319080022
Implantable Lead Implant Date: 20060802
Implantable Lead Implant Date: 20060802
Implantable Lead Location: 753859
Implantable Lead Model: 7001
Implantable Pulse Generator Implant Date: 20180917
Lead Channel Impedance Value: 410 Ohm
Lead Channel Impedance Value: 540 Ohm
Lead Channel Pacing Threshold Amplitude: 0.5 V
Lead Channel Pacing Threshold Pulse Width: 0.5 ms
Lead Channel Sensing Intrinsic Amplitude: 6.3 mV
Lead Channel Setting Pacing Amplitude: 2.5 V
Lead Channel Setting Pacing Amplitude: 2.5 V
Lead Channel Setting Pacing Pulse Width: 0.5 ms
Lead Channel Setting Pacing Pulse Width: 1 ms
MDC IDC LEAD IMPLANT DT: 20060802
MDC IDC LEAD LOCATION: 753858
MDC IDC LEAD LOCATION: 753860
MDC IDC MSMT BATTERY REMAINING PERCENTAGE: 95.5 %
MDC IDC MSMT BATTERY VOLTAGE: 2.99 V
MDC IDC MSMT LEADCHNL LV PACING THRESHOLD AMPLITUDE: 1.5 V
MDC IDC MSMT LEADCHNL LV PACING THRESHOLD PULSEWIDTH: 1 ms
MDC IDC SET LEADCHNL RV SENSING SENSITIVITY: 4 mV
Pulse Gen Model: 3222
Pulse Gen Serial Number: 8900059

## 2017-10-04 DEATH — deceased

## 2017-11-03 DEATH — deceased

## 2018-12-22 IMAGING — DX DG CHEST 2V
2 series · 2 of 2 positions shown · non-contrast
Comparison: 02/27/2014.

CLINICAL DATA: Pneumonia. Recently diagnosed pneumonia and CHF.
Cough for the past week. Shortness of breath.

EXAM:
CHEST  2 VIEW

[chest pa]
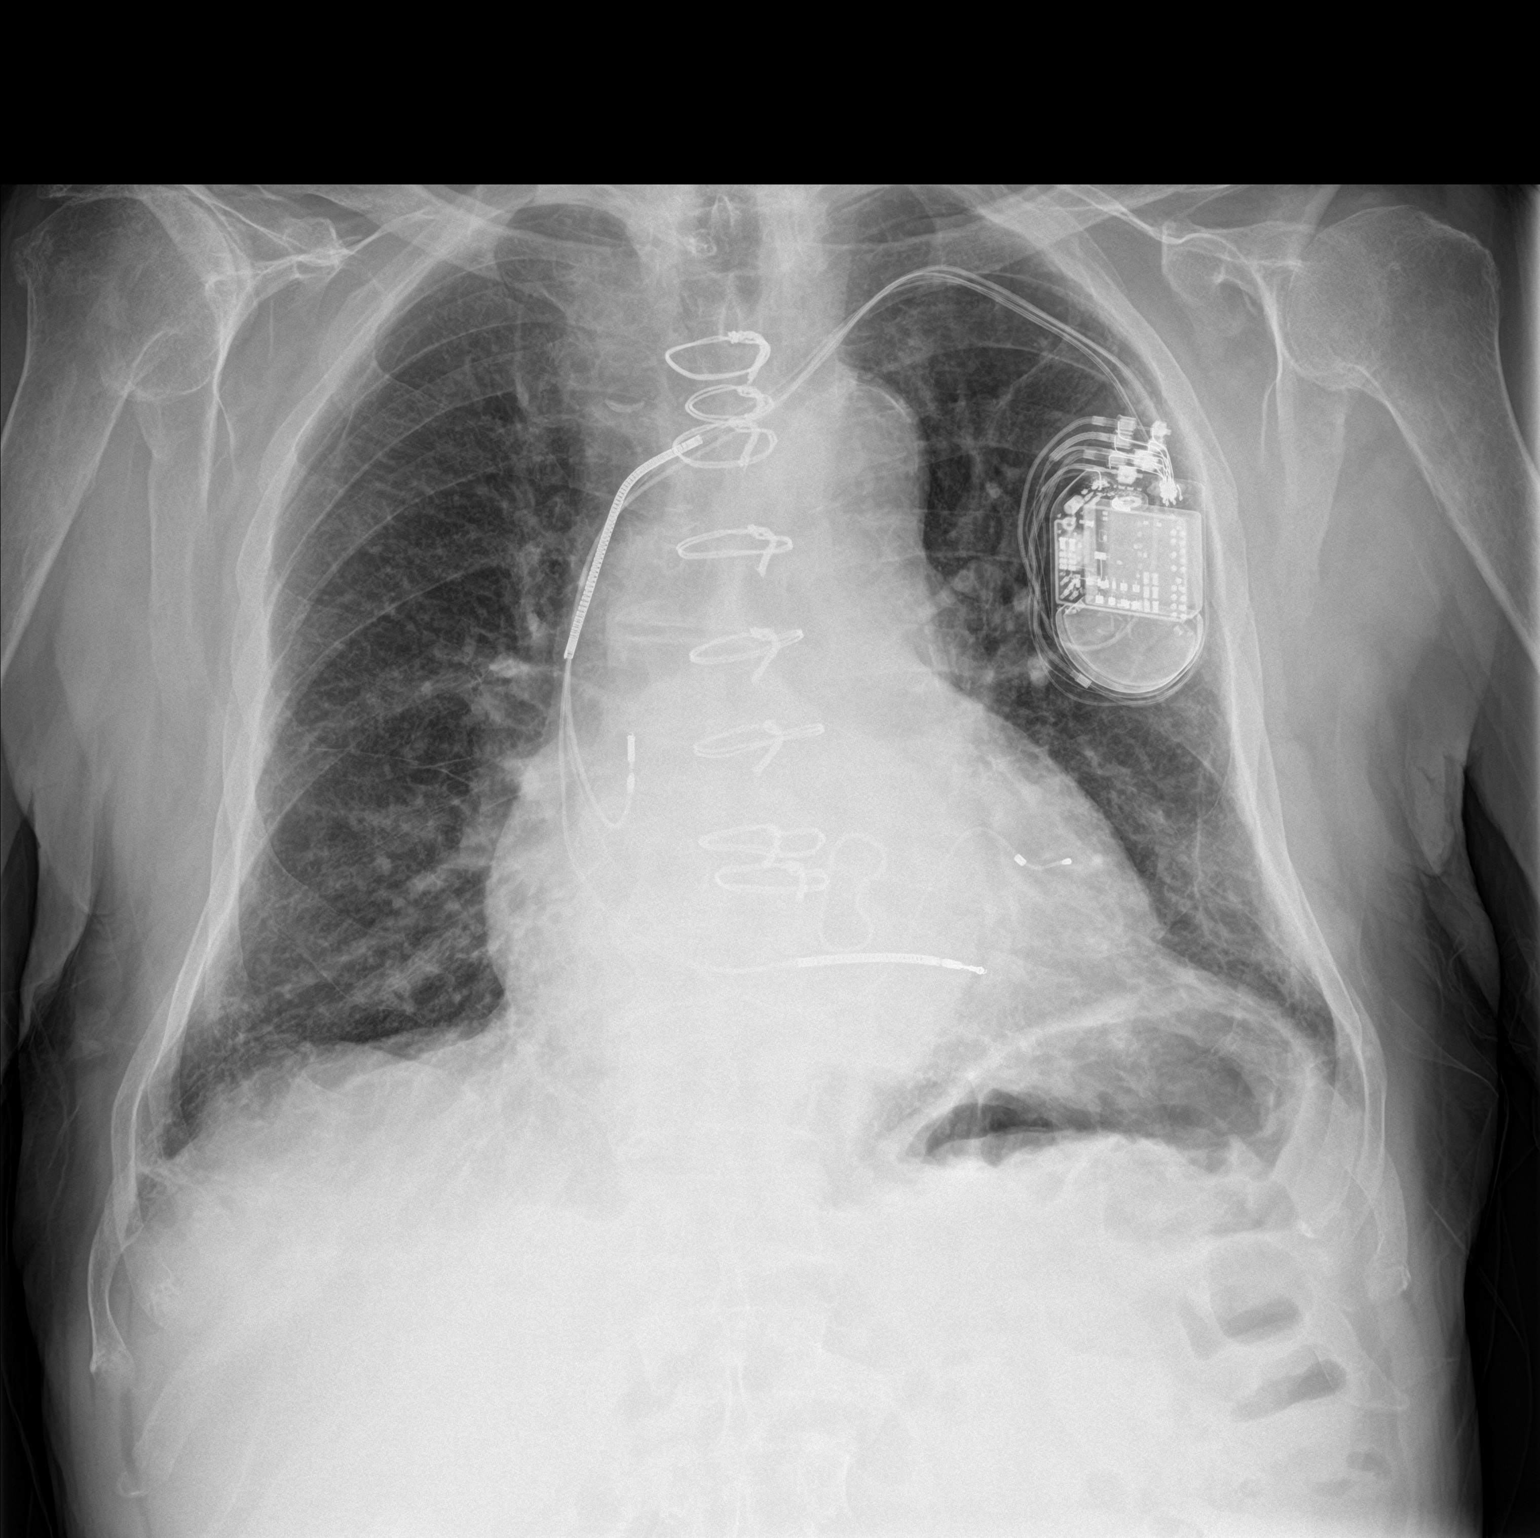

[chest lat]
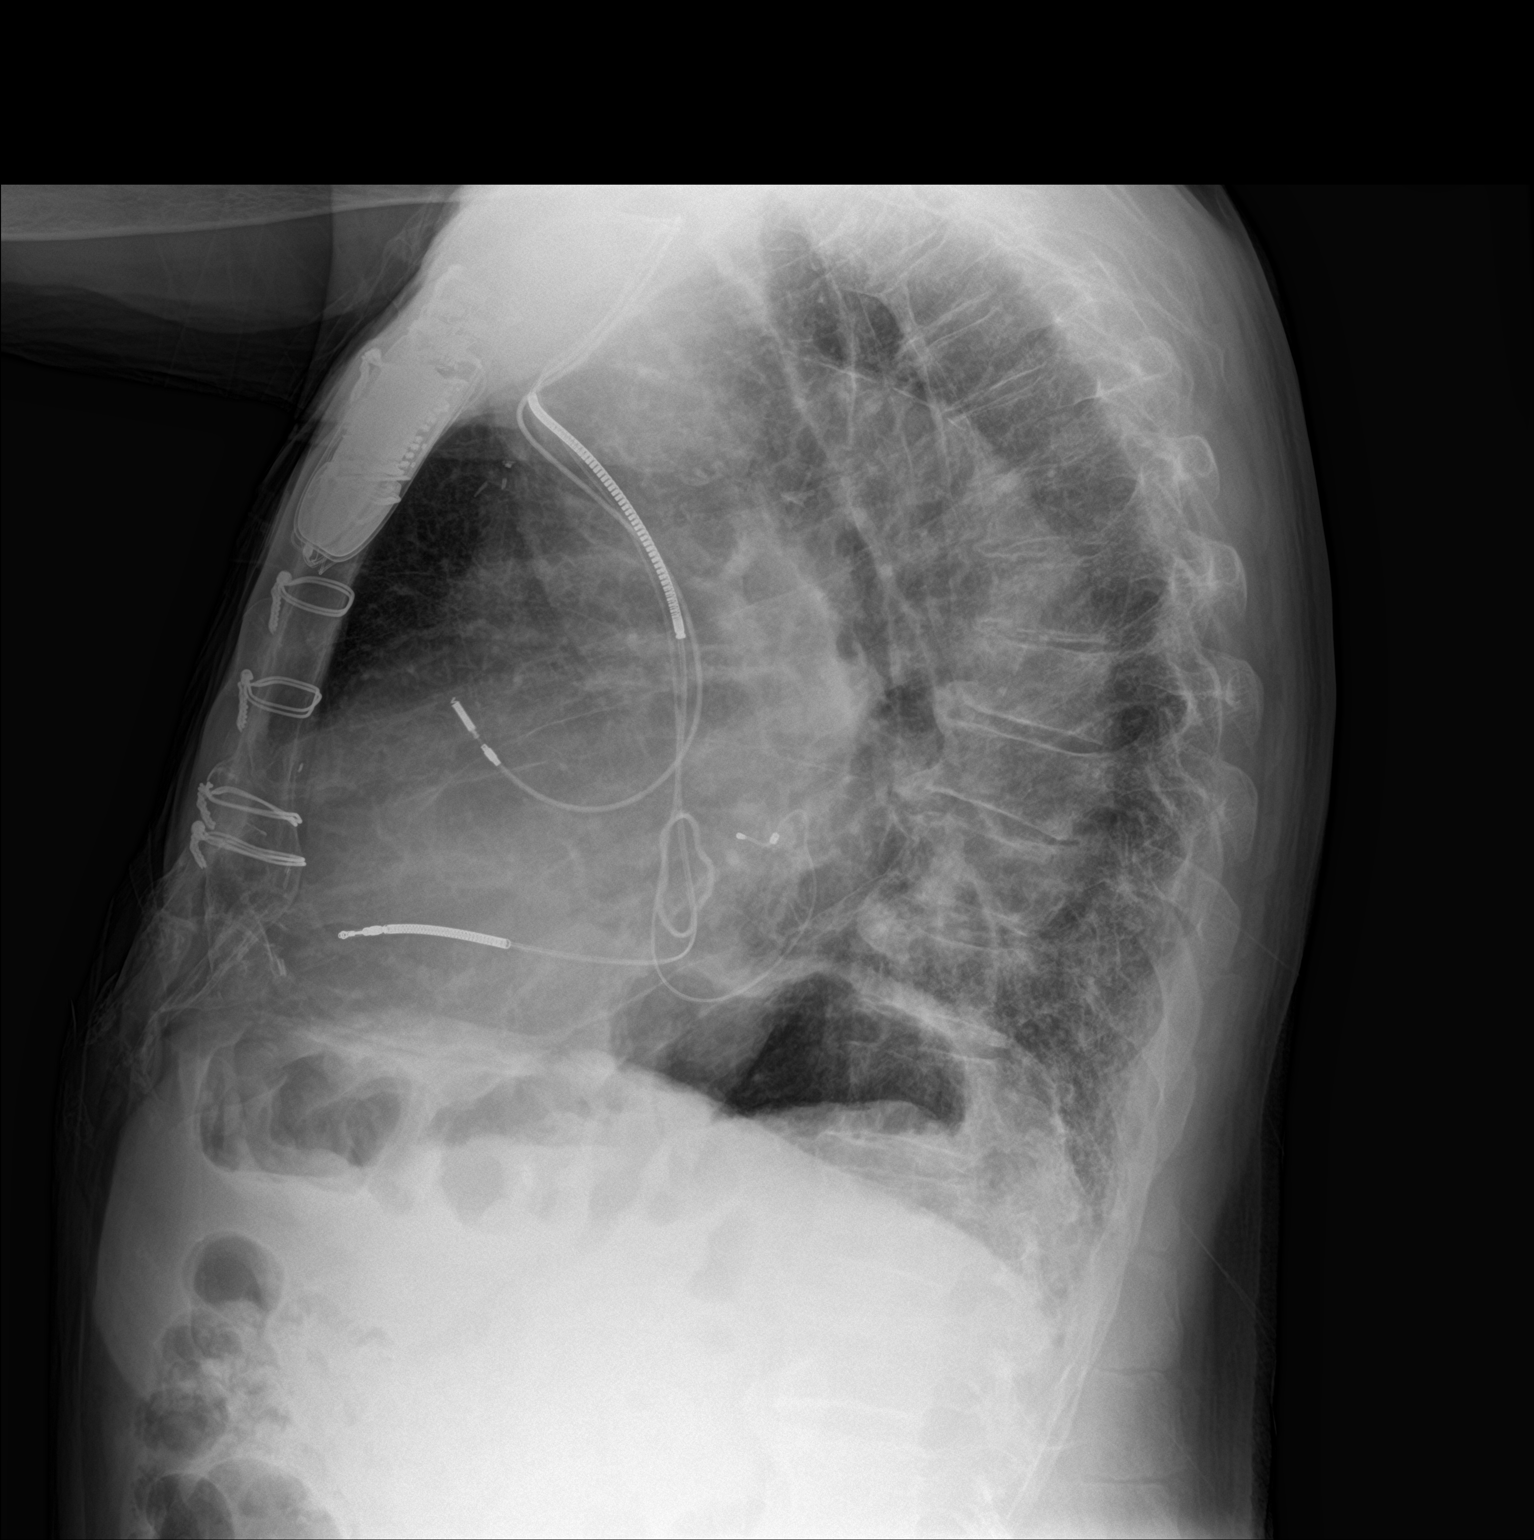

[2 of 2 positions shown; findings below may reference images not displayed]

FINDINGS: Stable enlarged cardiac silhouette, median sternotomy wires,
prosthetic mitral valve and left subclavian pacer and AICD leads.
Stable prominence of the interstitial markings. Otherwise, clear
lungs. Diffuse osteopenia. Right shoulder degenerative changes
thoracic spine degenerative changes. Aortic calcification.
IMPRESSION: 1. No acute abnormality.
2. Stable cardiomegaly, chronic interstitial lung disease and aortic
atherosclerosis.

## 2018-12-23 IMAGING — CT CT CHEST W/O CM
2 of 3 series · 15 of 36 positions shown, 18 images · non-contrast
Comparison: 06/17/2010

CLINICAL DATA: Cough.  Hypoxia.

EXAM:
CT CHEST WITHOUT CONTRAST
TECHNIQUE: Multidetector CT imaging of the chest was performed following the
standard protocol without IV contrast.

[Series 2: chest w/o 2mm st · axial · non-contrast · 0.84mm/px · z∈[+809,+1091]mm · 12 of 167 slices shown, 15 images]
[im 13/167  mediastinal]
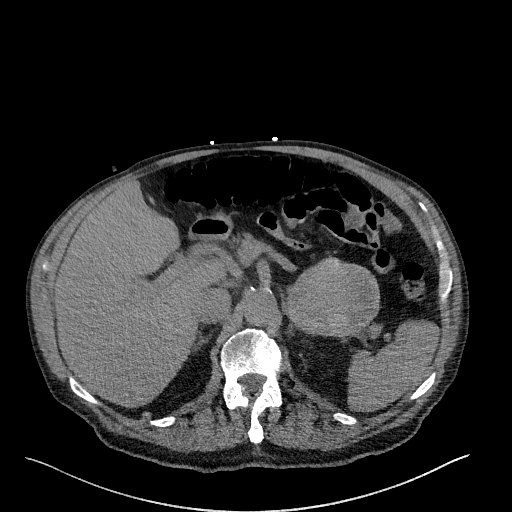
[im 13/167  lung]
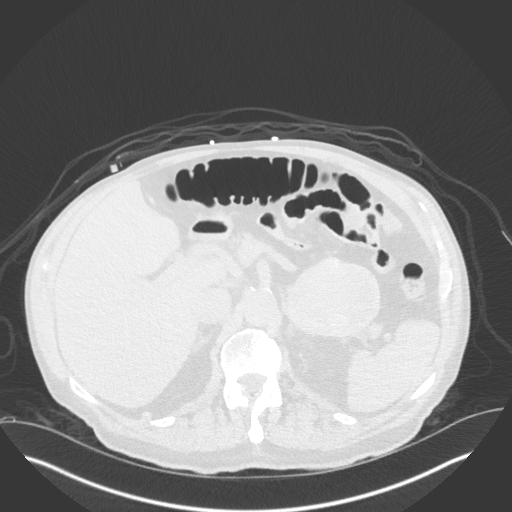
[im 25/167  lung]
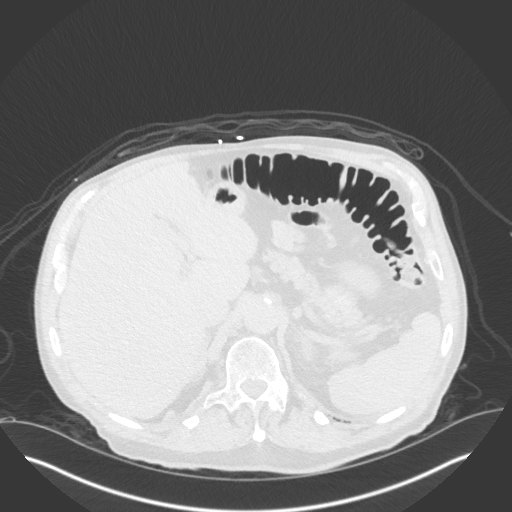
[im 37/167  lung]
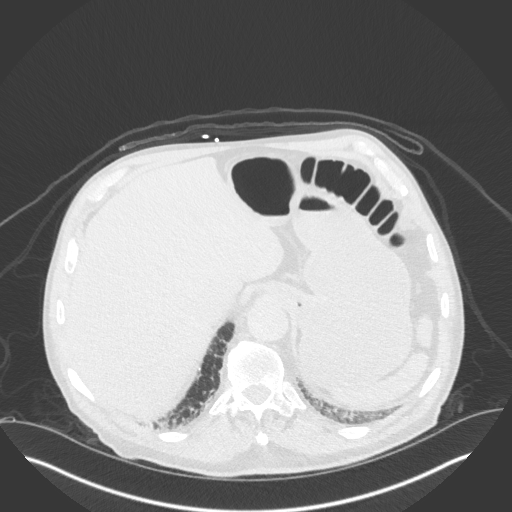
[im 50/167  lung]
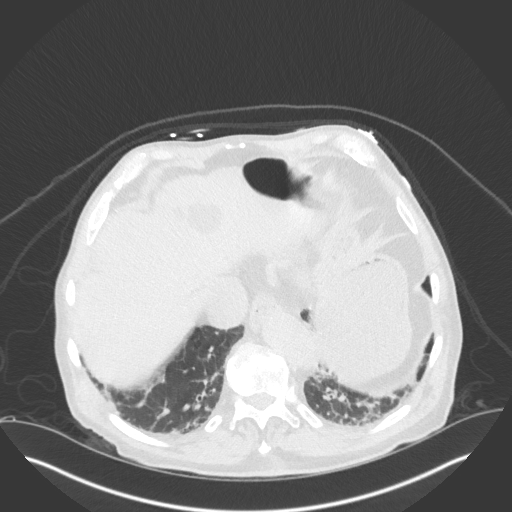
[im 62/167  mediastinal]
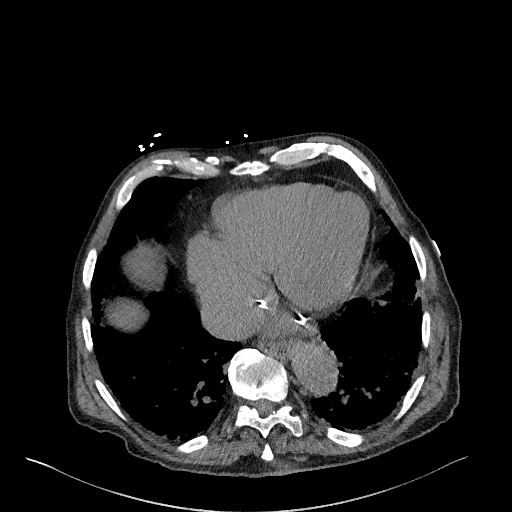
[im 62/167  lung]
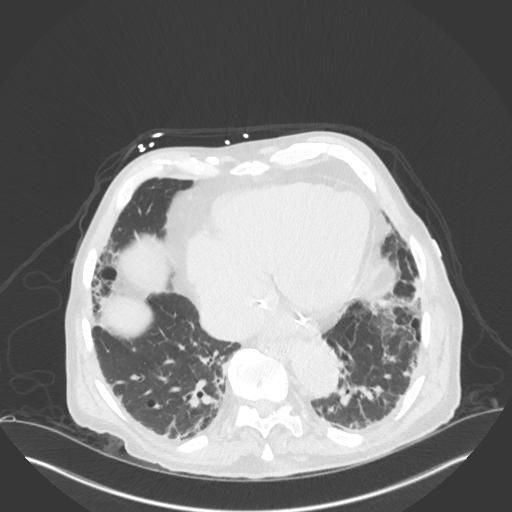
[im 74/167  lung]
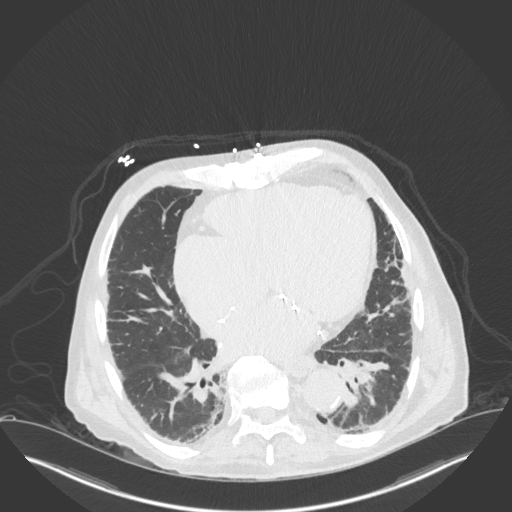
[im 93/167  lung]
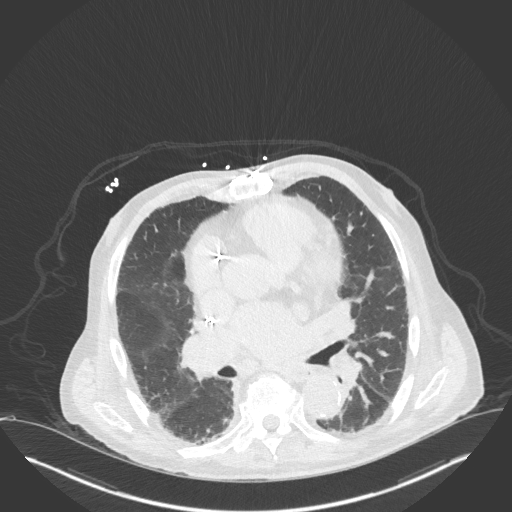
[im 105/167  lung]
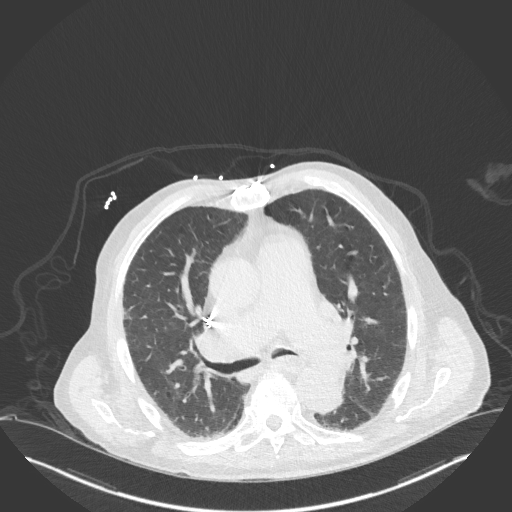
[im 117/167  mediastinal]
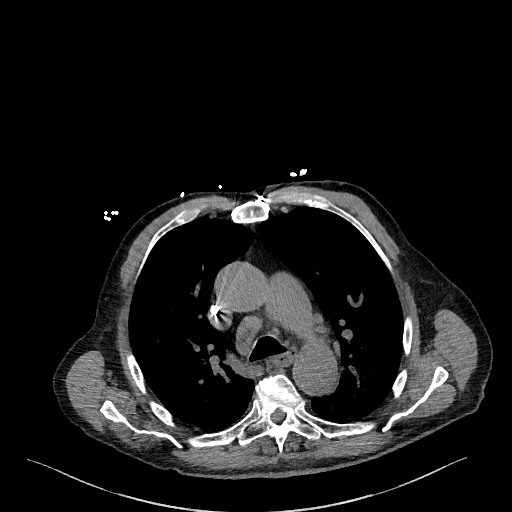
[im 117/167  lung]
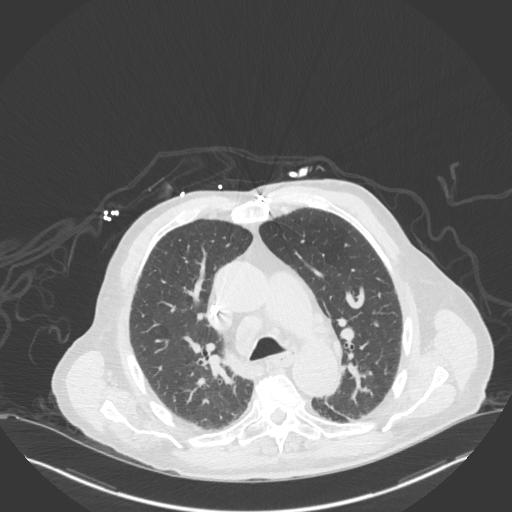
[im 130/167  lung]
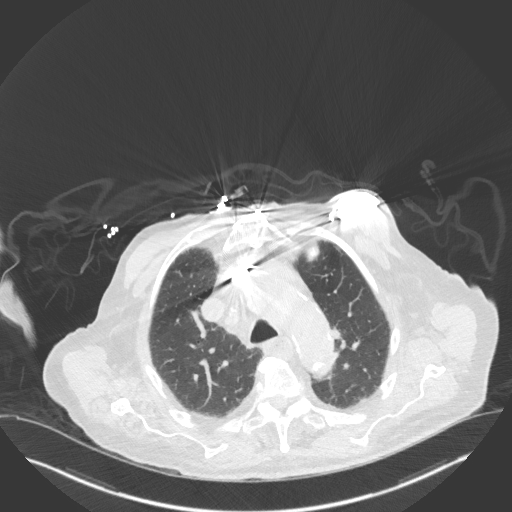
[im 142/167  lung]
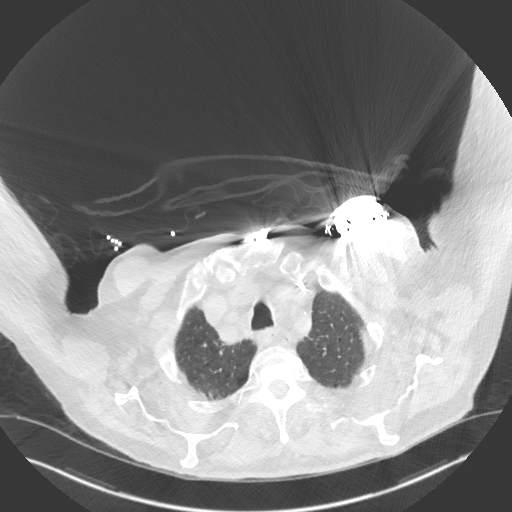
[im 154/167  lung]
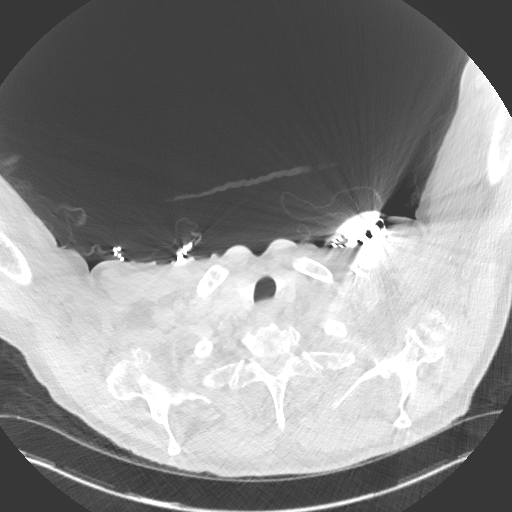

[Series 5: chest w/o 3mm st cor · coronal · non-contrast · 0.65mm/px · 3 of 100 slices shown]
[im 20/100  lung]
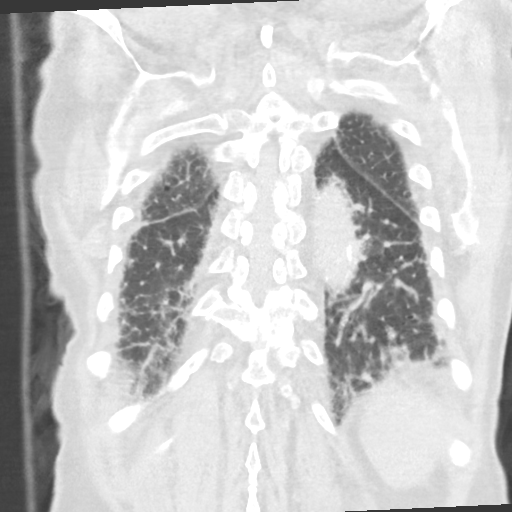
[im 40/100  lung]
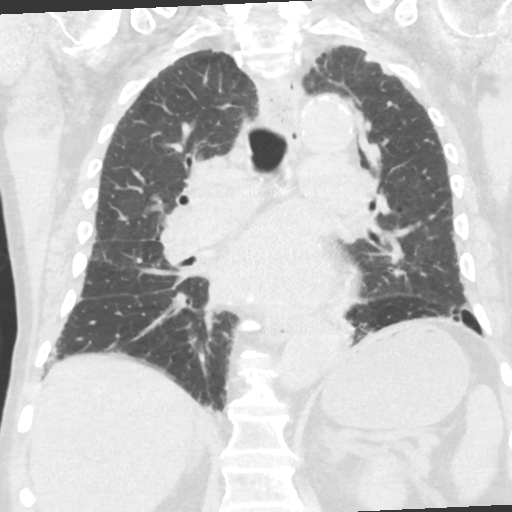
[im 60/100  lung]
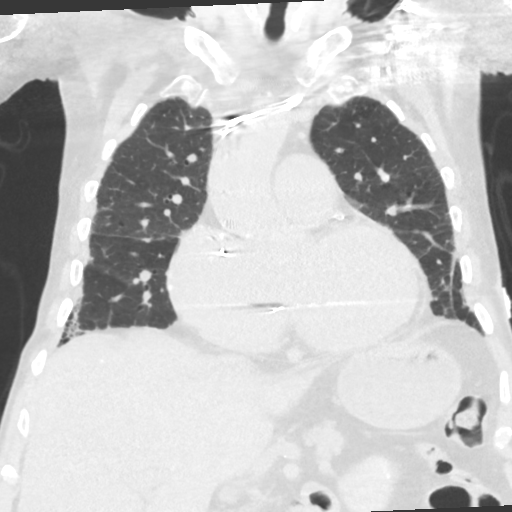

[15 of 36 positions shown; findings below may reference images not displayed]

FINDINGS: Cardiovascular: Coronary, aortic arch, and branch vessel
atherosclerotic vascular disease. Pacer leads noted. Prosthetic
mitral valve. Moderate cardiomegaly.

Mediastinum/Nodes: Scattered mildly enlarged lymph nodes, including
a 1.7 cm in short axis right lower paratracheal node with a fatty
hilum (formerly the same) and a lymph node anterior to the left
pulmonary artery measuring 1 cm 1.2 cm in short axis (formerly
cm).

Lungs/Pleura: Mild peripheral interstitial accentuation especially
at the lung bases, with bilateral airway thickening. Mild
atelectasis and slightly more coarse interstitial accentuation in
the lower lobes and lower portions of the lingula and right middle
lobe.

Upper Abdomen: We partially include a 8.1 by 7.0 mildly
heterogeneous hyperdense lesion fairly closely associated with the
pancreas, adrenal gland, and splenic vasculature. A prior adrenal
mass in this vicinity measured 2.8 by 2.3 cm on 06/17/2010.

Left hepatic lobe cyst.

Musculoskeletal: Thoracic spondylosis. Severe degenerative
glenohumeral arthropathy on the right.
IMPRESSION: 1. 8.1 cm in diameter hyperdense and enlarging adrenal mass, adrenal
malignancy is not excluded.
2. Scattered volume loss and interstitial accentuation at the lung
bases with airway thickening, possible bronchitis with superimposed
chronic interstitial lung disease.
3. Moderate cardiomegaly with extensive atherosclerosis.
4. Mild adenopathy in the chest is similar to 06/17/2010 and may be
crown I chronic reactive adenopathy or nodal congestion due to low
grade congestive heart failure.
